# Patient Record
Sex: Male | Born: 1965 | Race: Black or African American | Hispanic: No | Marital: Married | Smoking: Current every day smoker
Health system: Southern US, Community
[De-identification: ages and names within clinical notes are randomized; demographics above are authoritative.]

## PROBLEM LIST (undated history)

## (undated) DIAGNOSIS — F1410 Cocaine abuse, uncomplicated: Secondary | ICD-10-CM

## (undated) DIAGNOSIS — M6282 Rhabdomyolysis: Secondary | ICD-10-CM

## (undated) DIAGNOSIS — I10 Essential (primary) hypertension: Secondary | ICD-10-CM

## (undated) DIAGNOSIS — R569 Unspecified convulsions: Secondary | ICD-10-CM

## (undated) DIAGNOSIS — Q2730 Arteriovenous malformation, site unspecified: Secondary | ICD-10-CM

## (undated) DIAGNOSIS — F1910 Other psychoactive substance abuse, uncomplicated: Secondary | ICD-10-CM

## (undated) DIAGNOSIS — F10239 Alcohol dependence with withdrawal, unspecified: Secondary | ICD-10-CM

## (undated) DIAGNOSIS — F10939 Alcohol use, unspecified with withdrawal, unspecified: Secondary | ICD-10-CM

## (undated) DIAGNOSIS — G40909 Epilepsy, unspecified, not intractable, without status epilepticus: Secondary | ICD-10-CM

## (undated) HISTORY — DX: Arteriovenous malformation, site unspecified: Q27.30

## (undated) HISTORY — DX: Essential (primary) hypertension: I10

## (undated) HISTORY — PX: OTHER SURGICAL HISTORY: SHX169

## (undated) HISTORY — DX: Epilepsy, unspecified, not intractable, without status epilepticus: G40.909

---

## 2001-12-22 ENCOUNTER — Emergency Department (HOSPITAL_COMMUNITY): Admission: EM | Admit: 2001-12-22 | Discharge: 2001-12-22 | Payer: Self-pay | Admitting: Emergency Medicine

## 2013-02-09 ENCOUNTER — Other Ambulatory Visit: Payer: Self-pay | Admitting: Neurology

## 2013-02-09 ENCOUNTER — Encounter: Payer: Self-pay | Admitting: Neurology

## 2013-02-09 ENCOUNTER — Ambulatory Visit (INDEPENDENT_AMBULATORY_CARE_PROVIDER_SITE_OTHER): Payer: Managed Care, Other (non HMO) | Admitting: Neurology

## 2013-02-09 ENCOUNTER — Ambulatory Visit (INDEPENDENT_AMBULATORY_CARE_PROVIDER_SITE_OTHER): Payer: Managed Care, Other (non HMO)

## 2013-02-09 VITALS — BP 125/67 | HR 76 | Ht 70.0 in | Wt 191.8 lb

## 2013-02-09 DIAGNOSIS — R569 Unspecified convulsions: Secondary | ICD-10-CM

## 2013-02-09 DIAGNOSIS — Q282 Arteriovenous malformation of cerebral vessels: Secondary | ICD-10-CM | POA: Insufficient documentation

## 2013-02-09 DIAGNOSIS — Q283 Other malformations of cerebral vessels: Secondary | ICD-10-CM

## 2013-02-09 MED ORDER — LEVETIRACETAM ER 500 MG PO TB24: 1000.0000 mg | ORAL_TABLET | Freq: Every day | ORAL | Status: DC

## 2013-02-09 NOTE — Procedures (Signed)
  History:  Samuel Atkinson is a 47 year old gentleman with a several week history of episodes of confusion, slurred speech, eye blinking, and difficulty talking. The patient is being evaluated for possible seizure events.  This is a routine EEG. No skull defects are noted. Medications include Norvasc, folic acid, Keppra, and Prilosec.  EEG classification: Normal awake  Description of the recording: The background rhythms of this recording consists of a fairly well modulated medium amplitude alpha rhythm of 10 Hz that is reactive to eye opening and closure. As the record progresses, the patient appears to remain in the waking state throughout the recording. Photic stimulation was performed, resulting in a bilateral and symmetric photic driving response. Hyperventilation was also performed, resulting in a minimal buildup of the background rhythm activities without significant slowing seen. At no time during the recording does there appear to be evidence of spike or spike wave discharges or evidence of focal slowing. EKG monitor shows no evidence of cardiac rhythm abnormalities with a heart rate of 66.  Impression: This is a normal EEG recording in the waking state. No evidence of ictal or interictal discharges are seen.

## 2013-02-09 NOTE — Patient Instructions (Addendum)
Overall you are doing fairly well but I do want to suggest a few things today:   As far as your medications are concerned, I would like to suggest increasing your Keppra XR to 1000mg  daily. Please take 2 tablets of the 500mg  strength.  As far as diagnostic testing: I would like to get an EEG study done today. Please notify the front desk that this will be scheduled for today.  No driving for 6 months minimum, please follow up with the DMV.   Please follow up with neurosurgery for further evaluation of the AVM and possible imaging.  I would like to see you back in 4 months, sooner if we need to. Please call us with any interim questions, concerns, problems, updates or refill requests.   Please also call us for any test results so we can go over those with you on the phone.  My clinical assistant and will answer any of your questions and relay your messages to me and also relay most of my messages to you.   Our phone number is [phone]. We also have an after hours call service for urgent matters and there is a physician on-call for urgent questions. For any emergencies you know to call 911 or go to the nearest emergency room

## 2013-02-09 NOTE — Progress Notes (Signed)
[facility]  Provider:  Dr Hosie Atkinson Referring Provider: [name], MD Primary Care Physician:  Samuel Copas, MD  CC: seizure disorder  HPI:  Samuel Atkinson is a 47 y.o. male here as a referral from Samuel. Abigail Atkinson for establishing care in patient with history of seizure disorder and AVM s/p embolization.   The past few weeks the patient's wife has noticed frequent episodes of confusion, slurred speech, multiple eye blinking, difficulty talking. During these times he appears to be started blankly and if he responds it does not make sense. Episodes last 20-30 seconds, he is having 3-4 per day. The patient reports being unaware of the symptoms, though he reports feeling out of it the last few days. He also notes brief episodes of cramping of his hands, and tingling paresthesias in his extremities. He does not attribute any of these events 2 seizures. The patient is wet reports his last seizure last year. He continues to take Keppra 500 mg daily, reports occasionally missing a dose though good compliance last few weeks. Denies any recent illnesses, no fevers, no neck stiffness. He does have her history of drug or alcohol abuse, though denies any recent usage. No change in sleep pattern, no new medications added recently.  He is a complicated medical history, involving drug and alcohol abuse, and multiple seizures since his 35s. These were initially attributed to his polysubstance abuse, the last year he was found to have a AVM and has undergone embolization x2. His most recent angiogram was in January 2014 and per the patient everything was normal.  Per PCP, Samuel Samuel Atkinson, note from 12/24/2012:Seizure disorder started with AVM discovered in August 2013, "on life support" from August 28 thru Sept 5 s/p 2 embolizations of AVM with angiogram Jan 2014. Hx of drug/EtOH abuse and suicide attempts.  Review of Systems: Out of a complete 14 system review, the patient complains of only the  following symptoms, and all other reviewed systems are negative. Positive for fatigue blurred vision feeling hot cramps ringing in ears slurred speech dizziness tremor headache depression decreased energy change in appetite disinterest in activity  History   Social History  . Marital Status: Single    Spouse Name: N/A    Number of Children: 3  . Years of Education: GED    Occupational History  . Unemployed    Social History Main Topics  . Smoking status: Current Every Day Smoker -- 1.00 packs/day  . Smokeless tobacco: Never Used  . Alcohol Use: No  . Drug Use: No     Comment: patient claims to be clean now.   . Sexual Activity: Not on file   Other Topics Concern  . Not on file   Social History Narrative   Consumes a lots of caffeine daily,resides with wife.    Family History  Problem Relation Age of Onset  . Hypertension Father   . Coronary artery disease Father   . Diabetes Mother   . Hypertension Mother   . Coronary artery disease Brother   . Healthy Brother   . Coronary artery disease Brother     Past Medical History  Diagnosis Date  . Hypertension   . Seizure disorder   . Hx of substance abuse     Past Surgical History  Procedure Laterality Date  . Avm embolization  2013    Current Outpatient Prescriptions  Medication Sig Dispense Refill  . amLODipine (NORVASC) 10 MG tablet Take 10 mg by mouth daily.      Marland Kitchen  folic acid (FOLVITE) 1 MG tablet Take 1 mg by mouth daily.      Marland Kitchen levETIRAcetam (KEPPRA XR) 500 MG 24 hr tablet Take 500 mg by mouth every 12 (twelve) hours.      Marland Kitchen omeprazole (PRILOSEC) 40 MG capsule Take 40 mg by mouth daily.       No current facility-administered medications for this visit.    Allergies as of 02/09/2013  . (Not on File)    Vitals: BP 125/67  Pulse 76  Ht 5\' 10"  (1.778 m)  Wt 191 lb 12 oz (86.977 kg)  BMI 27.51 kg/m2 Last Weight:  Wt Readings from Last 1 Encounters:  02/09/13 191 lb 12 oz (86.977 kg)   Last Height:    Ht Readings from Last 1 Encounters:  02/09/13 5\' 10"  (1.778 m)     Physical exam: Exam: Gen: NAD, conversant Eyes: anicteric sclerae, moist conjunctivae HENT: Atraumati Neck: Trachea midline; supple,  Lungs: CTA, no wheezing, rales, rhonic                          CV: RRR, no MRG Abdomen: Soft, non-tender;  Extremities: No peripheral edema  Skin: Normal temperature, no rash,  Psych: Appropriate affect, pleasant  Neuro: MS: AA&Ox3, appropriately interactive, normal affect   Attention: WORLD backwards  Speech: fluent w/o paraphasic error  Memory: good recent and remote recall  CN: PERRL, impaired left lateral gaze, no nystagmus, no ptosis, sensation intact to LT V1-V3 bilat, face symmetric, no weakness, hearing grossly intact, palate elevates symmetrically, shoulder shrug 5/5 bilat,  tongue protrudes midline, no fasiculations noted.  Motor: normal bulk and tone Strength: 5/5  In all extremities  Coord: slowed movements with RAM and FTN but no tremor noted  Reflexes: symmetrical, bilat downgoing toes,  Sens: LT intact in all extremities  Gait: posture, stance, stride and arm-swing normal.   Assessment:  After physical and neurologic examination, review of laboratory studies, imaging, neurophysiology testing and pre-existing records, assessment will be reviewed on the problem list.  Plan:  Treatment plan and additional workup will be reviewed under Problem List.  [name] is a pleasant 47 year old African gentleman who presents for initial evaluation and establishment of care for his seizure disorder. He is a complicated medical history including polysubstance abuse and AVM status post embolization x2. His seizures were relatively controlled until the past few weeks. His wife has noted frequent episodes, 3-4 times per day, of staring blankly with frequent eye blinking, verbal output that is not appropriate to conversation. His episodes last 2030 seconds and the  patient quickly returned to baseline. The patient is unaware these events, though reports feeling out of it lately. He has been taking Keppra 500 mg daily of the extended release form. He reports decent compliance though has missed a few doses. These events are concerning for breakthrough seizures, most likely complex partial. As his physical exam is stable there is less concern that there may be a leak from the AVM. The patient is going to followup with neurosurgery in the next week or so, will defer imaging to their recommendations at this time.  1) seizure disorder 2) AVM status post embolization  -Increase Keppra XRT 1 g daily, can consider titrating up in the future. -Will order routine EEG -Will defer on imaging at this time, pending evaluation by neurosurgery -Patient scheduled to followup with neurosurgery in the next 2 weeks. -Patient counseled on avoiding driving for 6 months minimum -Patient counseled  on avoiding alcohol and drug use as this can increase seizure frequency -Would hold off on starting Chantix at this time due to risks of increased seizure frequency. -Followup in 4 months or earlier as needed

## 2013-02-10 ENCOUNTER — Telehealth: Payer: Self-pay

## 2013-02-10 NOTE — Telephone Encounter (Signed)
Called patient and left message normal EEG results.

## 2013-02-14 ENCOUNTER — Telehealth: Payer: Self-pay | Admitting: Neurology

## 2013-02-15 ENCOUNTER — Other Ambulatory Visit: Payer: Self-pay | Admitting: Neurology

## 2013-02-15 DIAGNOSIS — R569 Unspecified convulsions: Secondary | ICD-10-CM

## 2013-02-16 NOTE — Telephone Encounter (Signed)
I spoke with Casimiro Needle from [employer].  He wanted to let us know the patients order has shipped, and nothing further is needed from [place] at this time.

## 2013-06-13 ENCOUNTER — Ambulatory Visit: Payer: Managed Care, Other (non HMO) | Admitting: Neurology

## 2013-07-07 ENCOUNTER — Ambulatory Visit (INDEPENDENT_AMBULATORY_CARE_PROVIDER_SITE_OTHER): Payer: Managed Care, Other (non HMO) | Admitting: Neurology

## 2013-07-07 ENCOUNTER — Encounter: Payer: Self-pay | Admitting: Neurology

## 2013-07-07 ENCOUNTER — Encounter (INDEPENDENT_AMBULATORY_CARE_PROVIDER_SITE_OTHER): Payer: Self-pay

## 2013-07-07 VITALS — BP 113/66 | HR 73 | Ht 72.5 in | Wt 197.0 lb

## 2013-07-07 DIAGNOSIS — R569 Unspecified convulsions: Secondary | ICD-10-CM

## 2013-07-07 NOTE — Progress Notes (Signed)
[facility]  Provider:  Dr Hosie Poisson Referring Provider: [name], MD Primary Care Physician:  [name], MD  CC: seizure disorder  HPI:  Samuel Atkinson is a 48 y.o. male here as a follow up from [name] for seizure management in patient with history of  AVM s/p embolization. Last visit was 01/2013 at which time his Keppra XRT was increased to 1000mg  daily, he was instructed to get a routine EEG and was scheduled to follow up with neurosurgery. His EEG was read as normal. Had angiogram done with neurosurgery in December and everything was stable. Tolerating Keppra well, takes it once a day. He is not currently driving, has been seizure free for 5 months. Wishes to start driving again.    Initial visit 01/2013: The past few weeks the patient's wife has noticed frequent episodes of confusion, slurred speech, multiple eye blinking, difficulty talking. During these times he appears to be started blankly and if he responds it does not make sense. Episodes last 20-30 seconds, he is having 3-4 per day. The patient reports being unaware of the symptoms, though he reports feeling out of it the last few days. He also notes brief episodes of cramping of his hands, and tingling paresthesias in his extremities. He does not attribute any of these events 2 seizures. The patient is wet reports his last seizure last year. He continues to take Keppra 500 mg daily, reports occasionally missing a dose though good compliance last few weeks. Denies any recent illnesses, no fevers, no neck stiffness. He does have her history of drug or alcohol abuse, though denies any recent usage. No change in sleep pattern, no new medications added recently. He is a complicated medical history, involving drug and alcohol abuse, and multiple seizures since his 50s. These were initially attributed to his polysubstance abuse, the last year he was found to have a AVM and has undergone embolization x2. His  most recent angiogram was in January 2014 and per the patient everything was normal.   Review of Systems: Out of a complete 14 system review, the patient complains of only the following symptoms, and all other reviewed systems are negative. + headache  History   Social History  . Marital Status: Single    Spouse Name: N/A    Number of Children: 3  . Years of Education: GED    Occupational History  . Unemployed    Social History Main Topics  . Smoking status: Current Every Day Smoker -- 1.00 packs/day  . Smokeless tobacco: Never Used  . Alcohol Use: No  . Drug Use: No     Comment: patient claims to be clean now.   . Sexual Activity: Not on file   Other Topics Concern  . Not on file   Social History Narrative   Consumes a lots of caffeine daily,resides with wife.    Family History  Problem Relation Age of Onset  . Hypertension Father   . Coronary artery disease Father   . Diabetes Mother   . Hypertension Mother   . Coronary artery disease Brother   . Healthy Brother   . Coronary artery disease Brother     Past Medical History  Diagnosis Date  . Hypertension   . Seizure disorder   . Hx of substance abuse   . AVM (arteriovenous malformation)     Past Surgical History  Procedure Laterality Date  . Avm embolization  2013    Current Outpatient Prescriptions  Medication Sig Dispense Refill  .  HYDROcodone-acetaminophen (NORCO/VICODIN) 5-325 MG per tablet as needed.       . Vitamin D, Ergocalciferol, (DRISDOL) 50000 UNITS CAPS capsule       . amLODipine (NORVASC) 10 MG tablet Take 10 mg by mouth daily.      . folic acid (FOLVITE) 1 MG tablet Take 1 mg by mouth daily.      Marland Kitchen. levETIRAcetam (KEPPRA XR) 500 MG 24 hr tablet Take 2 tablets (1,000 mg total) by mouth daily.  60 tablet  3  . omeprazole (PRILOSEC) 40 MG capsule Take 40 mg by mouth daily.       No current facility-administered medications for this visit.    Allergies as of 07/07/2013  . (No Known  Allergies)    Vitals: BP 113/66  Pulse 73  Ht 6' 0.5" (1.842 m)  Wt 197 lb (89.359 kg)  BMI 26.34 kg/m2 Last Weight:  Wt Readings from Last 1 Encounters:  07/07/13 197 lb (89.359 kg)   Last Height:   Ht Readings from Last 1 Encounters:  07/07/13 6' 0.5" (1.842 m)     Physical exam: Exam: Gen: NAD, conversant Eyes: anicteric sclerae, moist conjunctivae HENT: Atraumati Neck: Trachea midline; supple,  Lungs: CTA, no wheezing, rales, rhonic                          CV: RRR, no MRG Abdomen: Soft, non-tender;  Extremities: No peripheral edema  Skin: Normal temperature, no rash,  Psych: Appropriate affect, pleasant  Neuro: MS: AA&Ox3, appropriately interactive, normal affect   Speech: fluent w/o paraphasic error  Memory: good recent and remote recall  CN: PERRL, impaired left lateral gaze, no nystagmus, no ptosis, sensation intact to LT V1-V3 bilat, face symmetric, no weakness, hearing grossly intact, palate elevates symmetrically, shoulder shrug 5/5 bilat,  tongue protrudes midline, no fasiculations noted.  Motor: normal bulk and tone Strength: 5/5  In all extremities  Coord: slowed movements with RAM and FTN but no tremor noted  Reflexes: symmetrical, bilat downgoing toes,  Sens: LT intact in all extremities  Gait: posture, stance, stride and arm-swing normal.   Assessment:  After physical and neurologic examination, review of laboratory studies, imaging, neurophysiology testing and pre-existing records, assessment will be reviewed on the problem list.  Plan:  Treatment plan and additional workup will be reviewed under Problem List.  Samuel Atkinson is a pleasant 48 year old African gentleman who presents for follow up evaluation of his seizure disorder. He is a complicated medical history including polysubstance abuse and AVM status post embolization x2. At last visit his seizures were poorly controlled and his Keppra XR was increased to 1gm daily. He had a routine  EEG which was unremarkable. Overall doing well since last visit with no seizure in 5 months. Patient wishes to start driving again. Counseled him that he needs to be seizure free for 6 months. He will call the office at the end of this month and we will discuss driving at that time. He will remain on Keppra XR 1000mg  daily for now.   1) seizure disorder 2) AVM status post embolization  -continue Keppra XRT 1 g daily, can consider titrating up in the future. -Patient counseled on avoiding driving for 6 months minimum. Will call office end of this month to discuss further -Patient counseled on avoiding alcohol and drug use as this can increase seizure frequency -Follow up 6 months

## 2013-12-28 ENCOUNTER — Telehealth: Payer: Self-pay | Admitting: *Deleted

## 2013-12-28 NOTE — Telephone Encounter (Signed)
Called patient and left a message that his appt scheduled for 01-09-14 has been canceled due to Dr. Hosie PoissonSumner being on vacation. Patient is to call the office back to r/s.

## 2014-01-03 ENCOUNTER — Encounter: Payer: Self-pay | Admitting: Neurology

## 2014-01-03 ENCOUNTER — Telehealth: Payer: Self-pay | Admitting: Neurology

## 2014-01-03 NOTE — Telephone Encounter (Signed)
Spoke to patient's wife regarding rescheduling 01/09/14 appointment per Dr. Minus BreedingSumner's schedule, wife verbalized understanding. Printed and mailed letter with new appointment time.

## 2014-01-09 ENCOUNTER — Ambulatory Visit: Payer: Managed Care, Other (non HMO) | Admitting: Neurology

## 2014-02-06 ENCOUNTER — Telehealth: Payer: Self-pay | Admitting: *Deleted

## 2014-02-06 NOTE — Telephone Encounter (Signed)
Spoke to patient wife to r/s patient appt per Dr. Hosie PoissonSumner. Patient wife states that the patient needs an appt 1st thing in the morning because he woks 3rd shift. Patient wife will call back to r/s appt.

## 2014-02-07 ENCOUNTER — Ambulatory Visit: Payer: Managed Care, Other (non HMO) | Admitting: Neurology

## 2014-02-23 ENCOUNTER — Encounter: Payer: Self-pay | Admitting: Neurology

## 2014-02-23 ENCOUNTER — Ambulatory Visit (INDEPENDENT_AMBULATORY_CARE_PROVIDER_SITE_OTHER): Payer: Managed Care, Other (non HMO) | Admitting: Neurology

## 2014-02-23 VITALS — BP 113/58 | HR 62 | Ht 72.0 in | Wt 180.0 lb

## 2014-02-23 DIAGNOSIS — R569 Unspecified convulsions: Secondary | ICD-10-CM

## 2014-02-23 DIAGNOSIS — Q283 Other malformations of cerebral vessels: Secondary | ICD-10-CM

## 2014-02-23 DIAGNOSIS — Q282 Arteriovenous malformation of cerebral vessels: Secondary | ICD-10-CM

## 2014-02-23 MED ORDER — ESCITALOPRAM OXALATE 10 MG PO TABS: 10.0000 mg | ORAL_TABLET | Freq: Every day | ORAL | Status: DC

## 2014-02-23 NOTE — Progress Notes (Signed)
[facility]  Provider:  Dr Hosie Poisson Referring Provider: [name], MD Primary Care Physician:  [name], MD  CC: seizure disorder  HPI:  Samuel Atkinson is a 48 y.o. male here as a follow up from [name] for seizure management in patient with history of  AVM s/p embolization. Last visit was 06/2013 at which time he was doing well overall and no medication adjustments were made.  Notes last week not feeling well, unable to describe it. Went to work and noted generalized shaking of all 4 extremities, felt cold, was diaphoretic, was able to communicate with people. No LOC, no lack of memory of this time period. Shaking lasted around 5 minutes, felt out of it for the whole day. Wife notes he had been drinking a [employer] energy drink and a [employer] this day. Since this episode he has been having cramping in his legs and aching in his legs. He denies any DM, no thyroid abnormalities.   Continues to take Keppra XR  once daily. Tolerating it well. Wife notes that his anxiety is "out of control". He gets very nervous and anxious about everything. She notes this his memory is getting progressively worse. Trouble with short term memory.   Initial visit 01/2013: The past few weeks the patient's wife has noticed frequent episodes of confusion, slurred speech, multiple eye blinking, difficulty talking. During these times he appears to be started blankly and if he responds it does not make sense. Episodes last 20-30 seconds, he is having 3-4 per day. The patient reports being unaware of the symptoms, though he reports feeling out of it the last few days. He also notes brief episodes of cramping of his hands, and tingling paresthesias in his extremities. He does not attribute any of these events 2 seizures. The patient is wet reports his last seizure last year. He continues to take Keppra 500 mg daily, reports occasionally missing a dose though good compliance last few weeks.  Denies any recent illnesses, no fevers, no neck stiffness. He does have her history of drug or alcohol abuse, though denies any recent usage. No change in sleep pattern, no new medications added recently. He is a complicated medical history, involving drug and alcohol abuse, and multiple seizures since his 35s. These were initially attributed to his polysubstance abuse, the last year he was found to have a AVM and has undergone embolization x2. His most recent angiogram was in January 2014 and per the patient everything was normal.  Review of Systems: Out of a complete 14 system review, the patient complains of only the following symptoms, and all other reviewed systems are negative. + headache  History   Social History  . Marital Status: Single    Spouse Name: N/A    Number of Children: 3  . Years of Education: GED    Occupational History  . Unemployed    Social History Main Topics  . Smoking status: Current Every Day Smoker -- 1.00 packs/day  . Smokeless tobacco: Never Used  . Alcohol Use: No  . Drug Use: No     Comment: patient claims to be clean now.   . Sexual Activity: Not on file   Other Topics Concern  . Not on file   Social History Narrative   Consumes a lots of caffeine daily,resides with wife.    Family History  Problem Relation Age of Onset  . Hypertension Father   . Coronary artery disease Father   . Diabetes Mother   .  Hypertension Mother   . Coronary artery disease Brother   . Healthy Brother   . Coronary artery disease Brother     Past Medical History  Diagnosis Date  . Hypertension   . Seizure disorder   . Hx of substance abuse   . AVM (arteriovenous malformation)     Past Surgical History  Procedure Laterality Date  . Avm embolization  2013    Current Outpatient Prescriptions  Medication Sig Dispense Refill  . amLODipine (NORVASC) 10 MG tablet Take 10 mg by mouth daily.      . folic acid (FOLVITE) 1 MG tablet Take 1 mg by mouth daily.       Marland Kitchen HYDROcodone-acetaminophen (NORCO/VICODIN) 5-325 MG per tablet as needed.       . levETIRAcetam (KEPPRA XR) 500 MG 24 hr tablet Take 2 tablets (1,000 mg total) by mouth daily.  60 tablet  3  . omeprazole (PRILOSEC) 40 MG capsule Take 40 mg by mouth daily.      . Vitamin D, Ergocalciferol, (DRISDOL) 50000 UNITS CAPS capsule        No current facility-administered medications for this visit.    Allergies as of 02/23/2014  . (No Known Allergies)    Vitals: There were no vitals taken for this visit. Last Weight:  Wt Readings from Last 1 Encounters:  07/07/13 197 lb (89.359 kg)   Last Height:   Ht Readings from Last 1 Encounters:  07/07/13 6' 0.5" (1.842 m)     Physical exam: Exam: Gen: NAD, conversant Eyes: anicteric sclerae, moist conjunctivae HENT: Atraumati Neck: Trachea midline; supple,  Lungs: CTA, no wheezing, rales, rhonic                          CV: RRR, no MRG Abdomen: Soft, non-tender;  Extremities: No peripheral edema  Skin: Normal temperature, no rash,  Psych: Appropriate affect, pleasant  Neuro: MS: AA&Ox3, appropriately interactive, normal affect   Speech: fluent w/o paraphasic error  Memory: good recent and remote recall  CN: PERRL, impaired left lateral gaze, no nystagmus, no ptosis, sensation intact to LT V1-V3 bilat, face symmetric, no weakness, hearing grossly intact, palate elevates symmetrically, shoulder shrug 5/5 bilat,  tongue protrudes midline, no fasiculations noted.  Motor: normal bulk and tone Strength: 5/5  In all extremities  Coord: slowed movements with RAM and FTN but no tremor noted  Reflexes: symmetrical, bilat downgoing toes,  Sens: LT intact in all extremities  Gait: posture, stance, stride and arm-swing normal.   Assessment:  After physical and neurologic examination, review of laboratory studies, imaging, neurophysiology testing and pre-existing records, assessment will be reviewed on the problem list.  Plan:   Treatment plan and additional workup will be reviewed under Problem List.  1) seizure disorder 2) AVM status post embolization  Mr. Dupin is a pleasant 48 year old African gentleman who presents for follow up evaluation of his seizure disorder. He has a complicated medical history including polysubstance abuse and AVM status post embolization x2. Returns today for follow up visit. Since last visit, he notes one episode, which per description does not sound fully consistent with an epileptic event. Suspect it may be related to use of multiple energy drinks. Will check TSH and HbA1c for alternative etiology of episode (patient to fax recent blood work from PCP). Patient and wife note excessive anxiety and "moodiness". Discussed different options, will start low dose lexapro. Follow up with Dr Anne Hahn in 6 months.

## 2014-02-23 NOTE — Patient Instructions (Signed)
Overall you are doing fairly well but I do want to suggest a few things today:   Remember to drink plenty of fluid, eat healthy meals and do not skip any meals. Try to eat protein with a every meal and eat a healthy snack such as fruit or nuts in between meals. Try to keep a regular sleep-wake schedule and try to exercise daily, particularly in the form of walking, 20-30 minutes a day, if you can.   As far as your medications are concerned, I would like to suggest the following: 1)Please continue your Keppra at its current dose and schedule 2)I would like you to try taking Lexapro  daily  Please sign a medical release form so we can get your medical records  Please follow up in 6 months with Dr Anne Hahn.  Please call us with any interim questions, concerns, problems, updates or refill requests.   My clinical assistant and will answer any of your questions and relay your messages to me and also relay most of my messages to you.   Our phone number is [phone]. We also have an after hours call service for urgent matters and there is a physician on-call for urgent questions. For any emergencies you know to call 911 or go to the nearest emergency room

## 2014-03-25 ENCOUNTER — Emergency Department (HOSPITAL_COMMUNITY)
Admission: EM | Admit: 2014-03-25 | Discharge: 2014-03-26 | Disposition: A | Discharge status: Other Acute Inpatient Hospital | Payer: 59 | Attending: Emergency Medicine | Admitting: Emergency Medicine

## 2014-03-25 ENCOUNTER — Emergency Department (HOSPITAL_COMMUNITY): Payer: 59

## 2014-03-25 ENCOUNTER — Encounter (HOSPITAL_COMMUNITY): Payer: Self-pay | Admitting: Emergency Medicine

## 2014-03-25 DIAGNOSIS — Z91040 Latex allergy status: Secondary | ICD-10-CM | POA: Insufficient documentation

## 2014-03-25 DIAGNOSIS — F10988 Alcohol use, unspecified with other alcohol-induced disorder: Secondary | ICD-10-CM | POA: Diagnosis not present

## 2014-03-25 DIAGNOSIS — F329 Major depressive disorder, single episode, unspecified: Secondary | ICD-10-CM | POA: Diagnosis not present

## 2014-03-25 DIAGNOSIS — Z008 Encounter for other general examination: Secondary | ICD-10-CM | POA: Insufficient documentation

## 2014-03-25 DIAGNOSIS — F3289 Other specified depressive episodes: Secondary | ICD-10-CM | POA: Diagnosis not present

## 2014-03-25 DIAGNOSIS — F1410 Cocaine abuse, uncomplicated: Secondary | ICD-10-CM | POA: Diagnosis not present

## 2014-03-25 DIAGNOSIS — R4182 Altered mental status, unspecified: Secondary | ICD-10-CM | POA: Diagnosis not present

## 2014-03-25 DIAGNOSIS — F1014 Alcohol abuse with alcohol-induced mood disorder: Secondary | ICD-10-CM | POA: Diagnosis present

## 2014-03-25 DIAGNOSIS — G40909 Epilepsy, unspecified, not intractable, without status epilepticus: Secondary | ICD-10-CM | POA: Diagnosis not present

## 2014-03-25 DIAGNOSIS — I10 Essential (primary) hypertension: Secondary | ICD-10-CM | POA: Diagnosis not present

## 2014-03-25 DIAGNOSIS — Z79899 Other long term (current) drug therapy: Secondary | ICD-10-CM | POA: Insufficient documentation

## 2014-03-25 DIAGNOSIS — F32A Depression, unspecified: Secondary | ICD-10-CM

## 2014-03-25 DIAGNOSIS — F10959 Alcohol use, unspecified with alcohol-induced psychotic disorder, unspecified: Secondary | ICD-10-CM

## 2014-03-25 DIAGNOSIS — F1910 Other psychoactive substance abuse, uncomplicated: Secondary | ICD-10-CM

## 2014-03-25 DIAGNOSIS — Q283 Other malformations of cerebral vessels: Secondary | ICD-10-CM | POA: Diagnosis not present

## 2014-03-25 LAB — CBC WITH DIFFERENTIAL/PLATELET
BASOS ABS: 0 10*3/uL (ref 0.0–0.1)
BASOS PCT: 0 % (ref 0–1)
EOS PCT: 1 % (ref 0–5)
Eosinophils Absolute: 0.1 10*3/uL (ref 0.0–0.7)
HEMATOCRIT: 50.3 % (ref 39.0–52.0)
Hemoglobin: 17.8 g/dL — ABNORMAL HIGH (ref 13.0–17.0)
Lymphocytes Relative: 32 % (ref 12–46)
Lymphs Abs: 3.1 10*3/uL (ref 0.7–4.0)
MCH: 33.6 pg (ref 26.0–34.0)
MCHC: 35.4 g/dL (ref 30.0–36.0)
MCV: 95.1 fL (ref 78.0–100.0)
MONO ABS: 1.1 10*3/uL — AB (ref 0.1–1.0)
Monocytes Relative: 12 % (ref 3–12)
Neutro Abs: 5.4 10*3/uL (ref 1.7–7.7)
Neutrophils Relative %: 55 % (ref 43–77)
Platelets: 204 10*3/uL (ref 150–400)
RBC: 5.29 MIL/uL (ref 4.22–5.81)
RDW: 12.7 % (ref 11.5–15.5)
WBC: 9.7 10*3/uL (ref 4.0–10.5)

## 2014-03-25 LAB — COMPREHENSIVE METABOLIC PANEL
ALT: 30 U/L (ref 0–53)
AST: 46 U/L — AB (ref 0–37)
Albumin: 4.1 g/dL (ref 3.5–5.2)
Alkaline Phosphatase: 79 U/L (ref 39–117)
Anion gap: 22 — ABNORMAL HIGH (ref 5–15)
BUN: 16 mg/dL (ref 6–23)
CALCIUM: 9.5 mg/dL (ref 8.4–10.5)
CO2: 20 mEq/L (ref 19–32)
CREATININE: 1.18 mg/dL (ref 0.50–1.35)
Chloride: 96 mEq/L (ref 96–112)
GFR calc Af Amer: 83 mL/min — ABNORMAL LOW (ref >90)
GFR, EST NON AFRICAN AMERICAN: 71 mL/min — AB (ref >90)
Glucose, Bld: 109 mg/dL — ABNORMAL HIGH (ref 70–99)
Potassium: 4.6 mEq/L (ref 3.7–5.3)
Sodium: 138 mEq/L (ref 137–147)
Total Bilirubin: 1 mg/dL (ref 0.3–1.2)
Total Protein: 8.3 g/dL (ref 6.0–8.3)

## 2014-03-25 LAB — RAPID URINE DRUG SCREEN, HOSP PERFORMED
Amphetamines: NOT DETECTED
Barbiturates: NOT DETECTED
Benzodiazepines: NOT DETECTED
COCAINE: POSITIVE — AB
OPIATES: NOT DETECTED
Tetrahydrocannabinol: NOT DETECTED

## 2014-03-25 LAB — CBG MONITORING, ED
GLUCOSE-CAPILLARY: 100 mg/dL — AB (ref 70–99)
Glucose-Capillary: 59 mg/dL — ABNORMAL LOW (ref 70–99)

## 2014-03-25 LAB — ETHANOL: Alcohol, Ethyl (B): 173 mg/dL — ABNORMAL HIGH (ref 0–11)

## 2014-03-25 MED ORDER — IBUPROFEN 200 MG PO TABS
600.0000 mg | ORAL_TABLET | Freq: Four times a day (QID) | ORAL | Status: DC | PRN
Start: 2014-03-25 — End: 2014-03-26
  Administered 2014-03-25: 600 mg via ORAL
  Filled 2014-03-25: qty 3

## 2014-03-25 NOTE — ED Notes (Addendum)
Coco Cola provided for CBG of 59. DM Littie Deeds made aware of CBG and intervention.

## 2014-03-25 NOTE — ED Notes (Signed)
Left ankle and left wrist restraints removed. Pt verbalizes the understanding of criteria to remain out of restraints. Sandwich provided. Sitter at the bedside.

## 2014-03-25 NOTE — ED Notes (Signed)
Pt becoming more calm. He is aware of criteria to be released from restraints. He is having periods of confusion and being frightened.

## 2014-03-25 NOTE — ED Provider Notes (Signed)
CSN: [id number]     Arrival date & time 03/25/14  1205 History   First MD Initiated Contact with Patient 03/25/14 1242     Chief Complaint  Patient presents with  . Suicidal  . Homicidal     (Consider location/radiation/quality/duration/timing/severity/associated sxs/prior Treatment) HPI 48 y.o. male with a history of AV malformation status post embolization, as well as significant alcohol and cocaine abuse presenting today after he told his wife that he wanted detoxification from alcohol and cocaine. On arrival the patient was no longer seen this am was verbally aggressive and physically aggressive towards staff. Per the patient's wife the patient has been taking alcohol and using cocaine in the past 2 days, returned home today when he ran out of money.  She has a ho similar behavior on numerous occasions, and his wife denies that he is any more confused than normal, and she found him drinking ETOH this am.  She also states he has a ho noncompliance with medical therapy while in the hospital and she is the only person who can convince him reliably to take part in his care.  No associated medical complaints.   Past Medical History  Diagnosis Date  . Hypertension   . Seizure disorder   . Hx of substance abuse   . AVM (arteriovenous malformation)    Past Surgical History  Procedure Laterality Date  . Avm embolization  2013   Family History  Problem Relation Age of Onset  . Hypertension Father   . Coronary artery disease Father   . Diabetes Mother   . Hypertension Mother   . Coronary artery disease Brother   . Healthy Brother   . Coronary artery disease Brother    History  Substance Use Topics  . Smoking status: Current Every Day Smoker -- 1.00 packs/day  . Smokeless tobacco: Never Used  . Alcohol Use: Yes     Comment: Unsure of amount     Review of Systems  Unable to perform ROS: Mental status change      Allergies  Latex  Home Medications   Prior to Admission  medications   Medication Sig Start Date End Date Taking? Authorizing Provider  albuterol (PROVENTIL HFA;VENTOLIN HFA) 108 (90 BASE) MCG/ACT inhaler Inhale 1-2 puffs into the lungs every 6 (six) hours as needed for wheezing or shortness of breath.   Yes Historical Provider, MD  amLODipine (NORVASC) 10 MG tablet Take 10 mg by mouth daily.   Yes Historical Provider, MD  B Complex-Biotin-FA (SUPER B-50 COMPLEX) CAPS Take 1 capsule by mouth daily.   Yes Historical Provider, MD  CALCIUM MAGNESIUM 750 PO Take 1 tablet by mouth daily.   Yes Historical Provider, MD  Cholecalciferol (VITAMIN D-3) 5000 UNITS TABS Take 5,000 Units by mouth daily.   Yes Historical Provider, MD  escitalopram (LEXAPRO) 10 MG tablet Take 10 mg by mouth daily.   Yes Historical Provider, MD  folic acid (FOLVITE) 1 MG tablet Take 1 mg by mouth daily.   Yes Historical Provider, MD  levETIRAcetam (KEPPRA XR) 500 MG 24 hr tablet Take 500 mg by mouth daily.   Yes Historical Provider, MD  tiotropium (SPIRIVA) 18 MCG inhalation capsule Place 18 mcg into inhaler and inhale daily as needed (for shortness of breath).   Yes Historical Provider, MD   BP 119/62  Pulse 65  Temp(Src) 98 F (36.7 C) (Oral)  Resp 16  SpO2 98% Physical Exam  Vitals reviewed. Constitutional: He appears well-developed and well-nourished.  HENT:  Head: Normocephalic and atraumatic.  Eyes: Conjunctivae and EOM are normal.  Neck: Normal range of motion. Neck supple.  Cardiovascular: Normal rate, regular rhythm and normal heart sounds.   Pulmonary/Chest: Effort normal and breath sounds normal. No respiratory distress.  Abdominal: He exhibits no distension. There is no tenderness. There is no rebound and no guarding.  Musculoskeletal: Normal range of motion.  Neurological: He is alert.  Drowsy, MAE  Skin: Skin is warm and dry.    ED Course  Procedures (including critical care time) Labs Review Labs Reviewed  CBC WITH DIFFERENTIAL - Abnormal; Notable for  the following:    Hemoglobin 17.8 (*)    Monocytes Absolute 1.1 (*)    All other components within normal limits  COMPREHENSIVE METABOLIC PANEL - Abnormal; Notable for the following:    Glucose, Bld 109 (*)    AST 46 (*)    GFR calc non Af Amer 71 (*)    GFR calc Af Amer 83 (*)    Anion gap 22 (*)    All other components within normal limits  URINE RAPID DRUG SCREEN (HOSP PERFORMED) - Abnormal; Notable for the following:    Cocaine POSITIVE (*)    All other components within normal limits  ETHANOL - Abnormal; Notable for the following:    Alcohol, Ethyl (B) 173 (*)    All other components within normal limits  CBG MONITORING, ED - Abnormal; Notable for the following:    Glucose-Capillary 59 (*)    All other components within normal limits  CBG MONITORING, ED - Abnormal; Notable for the following:    Glucose-Capillary 100 (*)    All other components within normal limits    Imaging Review Ct Head Wo Contrast  03/25/2014   CLINICAL DATA:  Headache.  History of AVM.  EXAM: CT HEAD WITHOUT CONTRAST  TECHNIQUE: Contiguous axial images were obtained from the base of the skull through the vertex without intravenous contrast.  COMPARISON:  None  FINDINGS: There is no intra or extra-axial fluid collection or mass lesion. The basilar cisterns and ventricles have a normal appearance. There is no CT evidence for acute infarction or hemorrhage. There is significant artifact from coil in the right frontal lobe region. Bone windows are otherwise unremarkable.  IMPRESSION: No evidence for acute  abnormality.   Electronically Signed   By: Rosalie Gums M.D.   On: 03/25/2014 16:15     EKG Interpretation None      MDM   Final diagnoses:  Alcoholic psychosis, with unspecified complication    48 y.o. male with pertinent PMH of AVM sp embolization, substance abuse presents with recurrent intoxication.  Patient has a history of bender like episodes where he leaves the house, is gone for a few days,  and does cocaine and ETOH until he runs out of money then returns.  He had a similar episode over the last few days, came back today and stated he wanted to get help, but was actively drinking during this episode.  Per the patient's wife the patient requested detoxification this morning, however shortly after arrival to the hospital the patient became nonverbal and was aggressive towards staff members. Per his wife this is happened in the past and she is the only person who can get him to cooperate.  After approximately one hour the patient called, was cooperative, alert and oriented x3, and was only complaining of a mild headache. His physical exam was otherwise unremarkable he had no focal neurodeficits. CT head obtained  and unremarkable. UDS was positive for cocaine.  EtOH elevated to 170. Telepsych consulted for inpatient detoxification. He denies SI/HI/Hallucinations after calming  1. Alcoholic psychosis, with unspecified complication       Mirian Mo, MD 03/26/14 [phone]

## 2014-03-25 NOTE — ED Notes (Signed)
Pt now asking how he got here. He was reoriented to place and situation. He denies pain.

## 2014-03-25 NOTE — ED Notes (Addendum)
Pt stares at this RN and security as if he is looking through [place]. He has bouts of calmness then spurts of extreme agitation. Patient viewed as a threat to self and others. He does not answer questions when asked. He often awakes and  Appears frightened and tearful. He is oriented to place when this occurs.

## 2014-03-25 NOTE — ED Notes (Signed)
Pt's wife at bedside. Sitter at bedside. Telepsych at bedside and ready for TTS.

## 2014-03-25 NOTE — ED Notes (Signed)
Patient resting in bed with his eyes closed; no s/s of distress noted. Respirations regular and unlabored. Sitter at bedside for safety.

## 2014-03-25 NOTE — Consult Note (Signed)
  TTS assess-pt with addiction to alcohol and cocaine and hx of $400 use past 2 days referred to ED by wife voluntarily seeking treatment for same. NO HI/SI.No risk .  UDS/ETOH  confirms history. Will seek treatment help [facility] full

## 2014-03-25 NOTE — ED Notes (Signed)
Pt released from remaining restraints (right wrist and right ankle). MD Littie Deeds at bedside. Pt calm and cooperative at this time. Sitter at bedside. Will continue to monitor.

## 2014-03-25 NOTE — BH Assessment (Signed)
Assessment completed. Consulted Maryjean Morn, PA-C who recommended inpatient treatment. [facility] at capacity. TTS will contact other facilities for placement. Dr.Kohut has been notified of the recommendation.

## 2014-03-25 NOTE — BH Assessment (Signed)
Clinician attempted to assess patient. Patient spoke in very low tone, presented with no eye contact and appeared extremely drowsy. Clinician had to lean in, in an attempt to hear patient but still had difficulty. Clinician received limited information from patient. Per wife [name], patient had been smoking crack and drinking alcohol since he left the home. Patient had been depressed after ex-wife reopened child support case last week. Patient left the home Thursday and did not return until today. Patient asked wife to take him to hospital. Per wife patient got to hospital and became belligerent cursing at people in the lobby yelling "why are ya'll looking at me? I'll kill all you M F-ers." Patient had to be restrained for a couple of hours. Patient was not in restrains at time of assessment. Patient's wife stated before last weekend patient had not used cocaine since 2013. Patient responded "alot" when asked how much he had used. When prompted patient indicated he was not currently suicidal but clinician had difficulty hearing when he last experienced SI.  Patient will be reassessed when more alert and able to provide more information in regards to drug use and assess for safety.    Nira Retort, MSW, LCSW Triage Specialist [phone]

## 2014-03-25 NOTE — ED Notes (Signed)
2 belonging bags placed in locker number 31

## 2014-03-25 NOTE — ED Notes (Addendum)
TTS at bedside and wife remains at beside through out assessment.

## 2014-03-25 NOTE — ED Notes (Addendum)
Rowe Clack (patient's wife) would like to be contacted when psychiatry speak with patient and makes a decision.Wife would like patient to have inpatient care. Her numbers are  Home : [phone] Cell:     [phone]

## 2014-03-25 NOTE — ED Notes (Signed)
Pt wife states he has been having an increase in impulse behavior since Thursday evening. Wife reported pt had been missing from home since Thursday and appeared back this morning. Wife states he has not been taking his prescribed medication regularly. Wife reports he has been probably been taking cocaine and alcohol. Pt wife states he is HI and SI. Pt is currently unstable and very impulsive. Pt is verbally abusive and continues to act out on staff.

## 2014-03-25 NOTE — BH Assessment (Signed)
Tele Assessment Note   Samuel Atkinson is an 48 y.o. male presenting to [facility] ED after binging on alcohol and crack/cocaine. Pt stated  "I want help".  Pt denies SI, HI and AVH at this time. Pt reported that he has attempted suicide in the past by overdosing on medication. Pt has been hospitalized multiple times in the past. Pt is currently not receiving any mental health treatment at this time. Pt is reporting multiple depressive symptoms and shared that his sleep and appetite are poor. Pt reported that he has lost approximately 20lbs in the past month. Pt denies having access to weapons and did not report any pending criminal charges or upcoming court dates. Pt reported that he has been binging on alcohol and crack/cocaine for the past two days.  Pt is drowsy but oriented to person and place. Pt is calm and cooperative at this time. Pt maintained poor eye contact throughout the assessment. PT speech was soft. Pt thought process is coherent and relevant. Pt judgment and insight is fair. Pt did not report any physical, sexual and emotional abuse. Pt reported that he lives with his wife and shared that his family is his support.  Inpatient treatment has been recommended.   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 Hypertension   . Seizure disorder   . Hx of substance abuse   . AVM (arteriovenous malformation)    Axis IV: other psychosocial or environmental problems Axis V: 21-30 behavior considerably influenced by delusions or hallucinations OR serious impairment in judgment, communication OR inability to function in almost all areas  Past Medical History:  Past Medical History  Diagnosis Date  . Hypertension   . Seizure disorder   . Hx of substance abuse   . AVM (arteriovenous malformation)     Past Surgical History  Procedure Laterality Date  . Avm embolization   2013    Family History:  Family History  Problem Relation Age of Onset  . Hypertension Father   . Coronary artery disease Father   . Diabetes Mother   . Hypertension Mother   . Coronary artery disease Brother   . Healthy Brother   . Coronary artery disease Brother     Social History:  reports that he has been smoking.  He has never used smokeless tobacco. He reports that he drinks alcohol. He reports that he uses illicit drugs (Cocaine).  Additional Social History:  Alcohol / Drug Use History of alcohol / drug use?: Yes Withdrawal Symptoms: Irritability Substance #1 Name of Substance 1: Alcohol  1 - Age of First Use: 18 1 - Amount (size/oz): varies  1 - Frequency: multiple times a week  1 - Duration: ongoing  1 - Last Use / Amount: 03-25-14 BAL-[id number] Substance #2 Name of Substance 2: Crack/Cocaine 2 - Age of First Use:  21 2 - Amount (size/oz): $400 2 - Frequency: multiple times a week 2 - Duration: ongoing  2 - Last Use / Amount: 03-25-14 $400   CIWA: CIWA-Ar BP: 112/56 mmHg Pulse Rate: 92 COWS:    PATIENT STRENGTHS: (choose at least two) Average or above average intelligence Supportive family/friends  Allergies:  Allergies  Allergen Reactions  . Latex Rash    Home Medications:  (Not in a hospital admission)  OB/GYN Status:  No LMP for male patient.  General Assessment Data Location of Assessment: [facility] ED Is  this a Tele or Face-to-Face Assessment?: Face-to-Face Is this an Initial Assessment or a Re-assessment for this encounter?: Initial Assessment Living Arrangements: Spouse/significant other Can pt return to current living arrangement?: Yes Admission Status: Voluntary Is patient capable of signing voluntary admission?: Yes Transfer from: Home Referral Source: Self/Family/Friend     [facility] Crisis Care Plan Living Arrangements: Spouse/significant other Name of Psychiatrist: None reported Name of Therapist: None reported  Education Status Is patient  currently in school?: No Current Grade: NA Highest grade of school patient has completed: 12 Name of school: NA Contact person: NA  Risk to self with the past 6 months Suicidal Ideation: No Suicidal Intent: No-Not Currently/Within Last 6 Months Is patient at risk for suicide?: No Suicidal Plan?: No Access to Means: No What has been your use of drugs/alcohol within the last 12 months?: Binge uses alcohol and crack/cocaine Previous Attempts/Gestures: Yes How many times?: 1 Other Self Harm Risks: Substance abuse Triggers for Past Attempts: Unpredictable Intentional Self Injurious Behavior: None Family Suicide History: No Recent stressful life event(s): Other (Comment) Persecutory voices/beliefs?: No Depression: Yes Depression Symptoms: Despondent;Fatigue;Guilt;Loss of interest in usual pleasures;Feeling angry/irritable;Feeling worthless/self pity Substance abuse history and/or treatment for substance abuse?: Yes Suicide prevention information given to non-admitted patients: Not applicable  Risk to Others within the past 6 months Homicidal Ideation: No Thoughts of Harm to Others: No Current Homicidal Intent: No Current Homicidal Plan: No Access to Homicidal Means: No Identified Victim: NA History of harm to others?: No Assessment of Violence: None Noted Violent Behavior Description: Pt is calm and cooperative at this time. Does patient have access to weapons?: No Criminal Charges Pending?: No Does patient have a court date: No  Psychosis Hallucinations: None noted Delusions: None noted  Mental Status Report Appear/Hygiene: In hospital gown Eye Contact: Poor Motor Activity: Freedom of movement Speech: Soft Level of Consciousness: Drowsy Mood: Depressed Affect: Appropriate to circumstance Anxiety Level: None Thought Processes: Coherent;Relevant Judgement: Unimpaired Orientation: Person;Place Obsessive Compulsive Thoughts/Behaviors: None  Cognitive  Functioning Concentration: Normal Memory: Recent Intact;Remote Intact IQ: Average Insight: Poor Impulse Control: Poor Appetite: Poor Weight Loss: 20 Weight Gain: 0 Sleep: Decreased Total Hours of Sleep: 4 Vegetative Symptoms: Staying in bed  ADLScreening [facility] Assessment Services) Patient's cognitive ability adequate to safely complete daily activities?: Yes Patient able to express need for assistance with ADLs?: Yes Independently performs ADLs?: Yes (appropriate for developmental age)  Prior Inpatient Therapy Prior Inpatient Therapy: Yes Prior Therapy Dates: 2000 Prior Therapy Facilty/Provider(s): [facility] Reason for Treatment: Detox  Prior Outpatient Therapy Prior Outpatient Therapy: Yes Prior Therapy Dates: 2000 Prior Therapy Facilty/Provider(s): [facility] Reason for Treatment: [facility]  ADL Screening (condition at time of admission) Patient's cognitive ability adequate to safely complete daily activities?: Yes Is the patient deaf or have difficulty hearing?: No Does the patient have difficulty seeing, even when wearing glasses/contacts?: No Does the patient have difficulty concentrating, remembering, or making decisions?: No Patient able to express need for assistance with ADLs?: Yes Does the patient have difficulty dressing or bathing?: No Independently performs ADLs?: Yes (appropriate for developmental age)       Abuse/Neglect Assessment (Assessment to be complete while patient is alone) Physical Abuse: Denies Verbal Abuse: Denies Sexual Abuse: Denies Exploitation of patient/patient's resources: Denies Self-Neglect: Denies Values / Beliefs Cultural Requests During Hospitalization: None Spiritual Requests During Hospitalization: None   Advance Directives (For Healthcare) Does patient have an advance directive?: No    Additional Information 1:1 In Past 12 Months?: Yes CIRT Risk: No Elopement Risk:  No     Disposition:  Disposition Initial Assessment  Completed for this Encounter: Yes Disposition of Patient: Inpatient treatment program Type of inpatient treatment program: Adult  Leeandra Ellerson S 03/25/2014 8:53 PM

## 2014-03-25 NOTE — ED Notes (Signed)
CBG 100 on ED glucometer

## 2014-03-26 ENCOUNTER — Encounter (HOSPITAL_COMMUNITY): Payer: Self-pay | Admitting: Registered Nurse

## 2014-03-26 ENCOUNTER — Observation Stay (HOSPITAL_COMMUNITY)
Admission: AD | Admit: 2014-03-26 | Discharge: 2014-03-27 | Disposition: A | Discharge status: Home / Self Care | Payer: 59 | Source: Intra-hospital | Attending: Psychiatry | Admitting: Psychiatry

## 2014-03-26 DIAGNOSIS — Z91040 Latex allergy status: Secondary | ICD-10-CM | POA: Insufficient documentation

## 2014-03-26 DIAGNOSIS — F17200 Nicotine dependence, unspecified, uncomplicated: Secondary | ICD-10-CM | POA: Diagnosis not present

## 2014-03-26 DIAGNOSIS — F332 Major depressive disorder, recurrent severe without psychotic features: Secondary | ICD-10-CM | POA: Diagnosis present

## 2014-03-26 DIAGNOSIS — F1910 Other psychoactive substance abuse, uncomplicated: Secondary | ICD-10-CM

## 2014-03-26 DIAGNOSIS — G40909 Epilepsy, unspecified, not intractable, without status epilepticus: Secondary | ICD-10-CM | POA: Diagnosis not present

## 2014-03-26 DIAGNOSIS — F1010 Alcohol abuse, uncomplicated: Secondary | ICD-10-CM | POA: Diagnosis not present

## 2014-03-26 DIAGNOSIS — F1994 Other psychoactive substance use, unspecified with psychoactive substance-induced mood disorder: Secondary | ICD-10-CM | POA: Diagnosis not present

## 2014-03-26 DIAGNOSIS — F1420 Cocaine dependence, uncomplicated: Secondary | ICD-10-CM | POA: Diagnosis not present

## 2014-03-26 DIAGNOSIS — I10 Essential (primary) hypertension: Secondary | ICD-10-CM | POA: Insufficient documentation

## 2014-03-26 DIAGNOSIS — Z79899 Other long term (current) drug therapy: Secondary | ICD-10-CM | POA: Diagnosis not present

## 2014-03-26 DIAGNOSIS — F1014 Alcohol abuse with alcohol-induced mood disorder: Secondary | ICD-10-CM | POA: Diagnosis present

## 2014-03-26 MED ORDER — ESCITALOPRAM OXALATE 10 MG PO TABS
10.0000 mg | ORAL_TABLET | Freq: Every day | ORAL | Status: DC
  Administered 2014-03-27: 10 mg via ORAL
  Filled 2014-03-26 (×3): qty 1

## 2014-03-26 MED ORDER — CHLORDIAZEPOXIDE HCL 25 MG PO CAPS
25.0000 mg | ORAL_CAPSULE | Freq: Once | ORAL | Status: AC
  Administered 2014-03-26: 25 mg via ORAL
  Filled 2014-03-26: qty 1

## 2014-03-26 MED ORDER — ADULT MULTIVITAMIN W/MINERALS CH
1.0000 | ORAL_TABLET | Freq: Every day | ORAL | Status: DC
  Administered 2014-03-27: 1 via ORAL
  Filled 2014-03-26 (×3): qty 1

## 2014-03-26 MED ORDER — HYDROXYZINE HCL 25 MG PO TABS: 25.0000 mg | ORAL_TABLET | Freq: Four times a day (QID) | ORAL | Status: DC | PRN

## 2014-03-26 MED ORDER — CHLORDIAZEPOXIDE HCL 25 MG PO CAPS: 25.0000 mg | ORAL_CAPSULE | Freq: Every day | ORAL | Status: DC

## 2014-03-26 MED ORDER — ESCITALOPRAM OXALATE 10 MG PO TABS
10.0000 mg | ORAL_TABLET | Freq: Every day | ORAL | Status: DC
  Administered 2014-03-26: 10 mg via ORAL
  Filled 2014-03-26: qty 1

## 2014-03-26 MED ORDER — VITAMIN D3 25 MCG (1000 UNIT) PO TABS
5000.0000 [IU] | ORAL_TABLET | Freq: Every day | ORAL | Status: DC
  Administered 2014-03-27: 5000 [IU] via ORAL
  Filled 2014-03-26 (×2): qty 5

## 2014-03-26 MED ORDER — B COMPLEX-C PO TABS
1.0000 | ORAL_TABLET | Freq: Every day | ORAL | Status: DC
  Administered 2014-03-27: 1 via ORAL
  Filled 2014-03-26 (×2): qty 1

## 2014-03-26 MED ORDER — TIOTROPIUM BROMIDE MONOHYDRATE 18 MCG IN CAPS
18.0000 ug | ORAL_CAPSULE | Freq: Every day | RESPIRATORY_TRACT | Status: DC | PRN
  Filled 2014-03-26: qty 5

## 2014-03-26 MED ORDER — FOLIC ACID 1 MG PO TABS
1.0000 mg | ORAL_TABLET | Freq: Every day | ORAL | Status: DC
  Administered 2014-03-27: 1 mg via ORAL
  Filled 2014-03-26 (×2): qty 1

## 2014-03-26 MED ORDER — VITAMIN B-1 100 MG PO TABS
100.0000 mg | ORAL_TABLET | Freq: Every day | ORAL | Status: DC
  Administered 2014-03-27: 100 mg via ORAL
  Filled 2014-03-26 (×3): qty 1

## 2014-03-26 MED ORDER — VITAMIN B-1 100 MG PO TABS: 100.0000 mg | ORAL_TABLET | Freq: Every day | ORAL | Status: DC

## 2014-03-26 MED ORDER — CHLORDIAZEPOXIDE HCL 25 MG PO CAPS: 25.0000 mg | ORAL_CAPSULE | Freq: Three times a day (TID) | ORAL | Status: DC

## 2014-03-26 MED ORDER — ONDANSETRON 4 MG PO TBDP: 4.0000 mg | ORAL_TABLET | Freq: Four times a day (QID) | ORAL | Status: DC | PRN

## 2014-03-26 MED ORDER — ALBUTEROL SULFATE HFA 108 (90 BASE) MCG/ACT IN AERS: 1.0000 | INHALATION_SPRAY | Freq: Four times a day (QID) | RESPIRATORY_TRACT | Status: DC | PRN

## 2014-03-26 MED ORDER — LOPERAMIDE HCL 2 MG PO CAPS: 2.0000 mg | ORAL_CAPSULE | ORAL | Status: DC | PRN

## 2014-03-26 MED ORDER — AMLODIPINE BESYLATE 10 MG PO TABS
10.0000 mg | ORAL_TABLET | Freq: Every day | ORAL | Status: DC
  Administered 2014-03-27: 10 mg via ORAL
  Filled 2014-03-26: qty 1
  Filled 2014-03-26: qty 2
  Filled 2014-03-26: qty 1

## 2014-03-26 MED ORDER — LEVETIRACETAM ER 500 MG PO TB24
500.0000 mg | ORAL_TABLET | Freq: Every day | ORAL | Status: DC
  Administered 2014-03-27: 500 mg via ORAL
  Filled 2014-03-26 (×3): qty 1

## 2014-03-26 MED ORDER — HYDROXYZINE HCL 25 MG PO TABS
25.0000 mg | ORAL_TABLET | Freq: Four times a day (QID) | ORAL | Status: DC | PRN
  Filled 2014-03-26: qty 1

## 2014-03-26 MED ORDER — CHLORDIAZEPOXIDE HCL 25 MG PO CAPS: 25.0000 mg | ORAL_CAPSULE | Freq: Four times a day (QID) | ORAL | Status: DC | PRN

## 2014-03-26 MED ORDER — CHLORDIAZEPOXIDE HCL 25 MG PO CAPS
25.0000 mg | ORAL_CAPSULE | Freq: Four times a day (QID) | ORAL | Status: DC
  Administered 2014-03-26 (×2): 25 mg via ORAL
  Filled 2014-03-26 (×2): qty 1

## 2014-03-26 MED ORDER — ADULT MULTIVITAMIN W/MINERALS CH
1.0000 | ORAL_TABLET | Freq: Every day | ORAL | Status: DC
  Administered 2014-03-26: 1 via ORAL
  Filled 2014-03-26: qty 1

## 2014-03-26 MED ORDER — THIAMINE HCL 100 MG/ML IJ SOLN
100.0000 mg | Freq: Once | INTRAMUSCULAR | Status: AC
  Administered 2014-03-26: 100 mg via INTRAMUSCULAR
  Filled 2014-03-26: qty 2

## 2014-03-26 MED ORDER — ALBUTEROL SULFATE HFA 108 (90 BASE) MCG/ACT IN AERS: 2.0000 | INHALATION_SPRAY | Freq: Four times a day (QID) | RESPIRATORY_TRACT | Status: DC | PRN

## 2014-03-26 MED ORDER — AMLODIPINE BESYLATE 10 MG PO TABS
10.0000 mg | ORAL_TABLET | Freq: Every day | ORAL | Status: DC
  Administered 2014-03-26: 10 mg via ORAL
  Filled 2014-03-26: qty 1

## 2014-03-26 MED ORDER — CHLORDIAZEPOXIDE HCL 25 MG PO CAPS: 25.0000 mg | ORAL_CAPSULE | ORAL | Status: DC

## 2014-03-26 MED ORDER — LEVETIRACETAM ER 500 MG PO TB24
500.0000 mg | ORAL_TABLET | Freq: Every day | ORAL | Status: DC
  Administered 2014-03-26: 500 mg via ORAL
  Filled 2014-03-26: qty 1

## 2014-03-26 MED ORDER — TIOTROPIUM BROMIDE MONOHYDRATE 18 MCG IN CAPS
18.0000 ug | ORAL_CAPSULE | Freq: Every day | RESPIRATORY_TRACT | Status: DC
  Administered 2014-03-27: 18 ug via RESPIRATORY_TRACT
  Filled 2014-03-26: qty 5

## 2014-03-26 NOTE — Progress Notes (Signed)
[facility] contacted the following detox facilities for placement:  [facility]-under review [facility]-declined due to seizure disorder [facility]-decline due to medical [facility]-no beds [facility]-no beds  Blain Pais, [facility]/NS

## 2014-03-26 NOTE — ED Notes (Signed)
Patient belonging removed from locker #31 and given to Nurse tech in psych ED.

## 2014-03-26 NOTE — Consult Note (Signed)
[facility] Consult   Reason for Consult:  Worsening depression and substance abuse Referring Physician:  EDP  Samuel Atkinson is an 48 y.o. male. Total Time spent with patient: 45 minutes  Assessment: AXIS I:  Alcohol Abuse, Major Depression, Recurrent severe, Substance Abuse and Substance Induced Mood Disorder AXIS II:  Deferred AXIS III:   Past Medical History  Diagnosis Date  . Hypertension   . Seizure disorder   . Hx of substance abuse   . AVM (arteriovenous malformation)    AXIS IV:  other psychosocial or environmental problems AXIS V:  11-20 some danger of hurting self or others possible OR occasionally fails to maintain minimal personal hygiene OR gross impairment in communication  Plan:  Recommend psychiatric Inpatient admission when medically cleared.  Subjective:    HPI:  Samuel Atkinson is a 48 y.o. male patient.  Presents to [facility] with complaints of worsening depression and suicidal thoughts.  Patient states that he was brought to ED by his wife because of his 2 week drinking beng.  Patient states that he also uses cocaine.  Stressors lost of job and drug use. Patient states that he has a history of depression and tried to kill himself 2 years ago via overdose and was hospitalized in [place].  Patient denies suicidal/homicidal ideation, psychosis, and paranoia.     HPI Elements:   Location:  Worsening depression. Quality:  substance abuse. Severity:  worsening depression. Timing:  2 weeks. Review of Systems  Respiratory: Negative.   Musculoskeletal: Negative.   Neurological: Positive for seizures (not related to alcohol).  Psychiatric/Behavioral: Positive for depression and substance abuse. Negative for suicidal ideas, hallucinations and memory loss. The patient is not nervous/anxious and does not have insomnia.   All other systems reviewed and are negative.  Family History  Problem Relation Age of Onset  . Hypertension Father   . Coronary artery disease  Father   . Diabetes Mother   . Hypertension Mother   . Coronary artery disease Brother   . Healthy Brother   . Coronary artery disease Brother      Past Psychiatric History: Past Medical History  Diagnosis Date  . Hypertension   . Seizure disorder   . Hx of substance abuse   . AVM (arteriovenous malformation)     reports that he has been smoking.  He has never used smokeless tobacco. He reports that he drinks alcohol. He reports that he uses illicit drugs (Cocaine). Family History  Problem Relation Age of Onset  . Hypertension Father   . Coronary artery disease Father   . Diabetes Mother   . Hypertension Mother   . Coronary artery disease Brother   . Healthy Brother   . Coronary artery disease Brother    Family History Substance Abuse: No Family Supports: Yes, List: (Family ) Living Arrangements: Spouse/significant other Can pt return to current living arrangement?: Yes Abuse/Neglect [employer]) Physical Abuse: Denies Verbal Abuse: Denies Sexual Abuse: Denies Allergies:   Allergies  Allergen Reactions  . Latex Rash    ACT Assessment Complete:  Yes:    Educational Status    Risk to Self: Risk to self with the past 6 months Suicidal Ideation: No Suicidal Intent: No-Not Currently/Within Last 6 Months Is patient at risk for suicide?: No Suicidal Plan?: No Access to Means: No What has been your use of drugs/alcohol within the last 12 months?: Binge uses alcohol and crack/cocaine Previous Attempts/Gestures: Yes How many times?: 1 Other Self Harm Risks: Substance abuse Triggers  for Past Attempts: Unpredictable Intentional Self Injurious Behavior: None Family Suicide History: No Recent stressful life event(s): Other (Comment) Persecutory voices/beliefs?: No Depression: Yes Depression Symptoms: Despondent;Fatigue;Guilt;Loss of interest in usual pleasures;Feeling angry/irritable;Feeling worthless/self pity Substance abuse history and/or treatment for substance abuse?:  Yes Suicide prevention information given to non-admitted patients: Not applicable  Risk to Others: Risk to Others within the past 6 months Homicidal Ideation: No Thoughts of Harm to Others: No Current Homicidal Intent: No Current Homicidal Plan: No Access to Homicidal Means: No Identified Victim: NA History of harm to others?: No Assessment of Violence: None Noted Violent Behavior Description: Pt is calm and cooperative at this time. Does patient have access to weapons?: No Criminal Charges Pending?: No Does patient have a court date: No  Abuse: Abuse/Neglect Assessment (Assessment to be complete while patient is alone) Physical Abuse: Denies Verbal Abuse: Denies Sexual Abuse: Denies Exploitation of patient/patient's resources: Denies Self-Neglect: Denies  Prior Inpatient Therapy: Prior Inpatient Therapy Prior Inpatient Therapy: Yes Prior Therapy Dates: 2000 Prior Therapy Facilty/Provider(s): [place] Reason for Treatment: Detox  Prior Outpatient Therapy: Prior Outpatient Therapy Prior Outpatient Therapy: Yes Prior Therapy Dates: 2000 Prior Therapy Facilty/Provider(s): [place] Reason for Treatment: SA  Additional Information: Additional Information 1:1 In Past 12 Months?: Yes CIRT Risk: No Elopement Risk: No                  Objective: Blood pressure 119/64, pulse 64, temperature 98.1 F (36.7 C), temperature source Oral, resp. rate 16, SpO2 99.00%.There is no weight on file to calculate BMI. Results for orders placed during the hospital encounter of 03/25/14 (from the past 72 hour(s))  CBC WITH DIFFERENTIAL     Status: Abnormal   Collection Time    03/25/14 12:51 PM      Result Value Ref Range   WBC 9.7  4.0 - 10.5 K/uL   RBC 5.29  4.22 - 5.81 MIL/uL   Hemoglobin 17.8 (*) 13.0 - 17.0 g/dL   HCT 50.3  39.0 - 52.0 %   MCV 95.1  78.0 - 100.0 fL   MCH 33.6  26.0 - 34.0 pg   MCHC 35.4  30.0 - 36.0 g/dL   RDW 12.7  11.5 - 15.5 %   Platelets 204   150 - 400 K/uL   Neutrophils Relative % 55  43 - 77 %   Neutro Abs 5.4  1.7 - 7.7 K/uL   Lymphocytes Relative 32  12 - 46 %   Lymphs Abs 3.1  0.7 - 4.0 K/uL   Monocytes Relative 12  3 - 12 %   Monocytes Absolute 1.1 (*) 0.1 - 1.0 K/uL   Eosinophils Relative 1  0 - 5 %   Eosinophils Absolute 0.1  0.0 - 0.7 K/uL   Basophils Relative 0  0 - 1 %   Basophils Absolute 0.0  0.0 - 0.1 K/uL  COMPREHENSIVE METABOLIC PANEL     Status: Abnormal   Collection Time    03/25/14 12:51 PM      Result Value Ref Range   Sodium 138  137 - 147 mEq/L   Potassium 4.6  3.7 - 5.3 mEq/L   Comment: MODERATE HEMOLYSIS     HEMOLYSIS AT THIS LEVEL MAY AFFECT RESULT   Chloride 96  96 - 112 mEq/L   CO2 20  19 - 32 mEq/L   Glucose, Bld 109 (*) 70 - 99 mg/dL   BUN 16  6 - 23 mg/dL   Creatinine, Ser 1.18  0.50 -  1.35 mg/dL   Calcium 9.5  8.4 - 10.5 mg/dL   Total Protein 8.3  6.0 - 8.3 g/dL   Albumin 4.1  3.5 - 5.2 g/dL   AST 46 (*) 0 - 37 U/L   Comment: MODERATE HEMOLYSIS     HEMOLYSIS AT THIS LEVEL MAY AFFECT RESULT   ALT 30  0 - 53 U/L   Comment: MODERATE HEMOLYSIS     HEMOLYSIS AT THIS LEVEL MAY AFFECT RESULT   Alkaline Phosphatase 79  39 - 117 U/L   Total Bilirubin 1.0  0.3 - 1.2 mg/dL   GFR calc non Af Amer 71 (*) >90 mL/min   GFR calc Af Amer 83 (*) >90 mL/min   Comment: (NOTE)     The eGFR has been calculated using the CKD EPI equation.     This calculation has not been validated in all clinical situations.     eGFR's persistently <90 mL/min signify possible Chronic Kidney     Disease.   Anion gap 22 (*) 5 - 15  ETHANOL     Status: Abnormal   Collection Time    03/25/14 12:51 PM      Result Value Ref Range   Alcohol, Ethyl (B) 173 (*) 0 - 11 mg/dL   Comment:            LOWEST DETECTABLE LIMIT FOR     SERUM ALCOHOL IS 11 mg/dL     FOR MEDICAL PURPOSES ONLY  CBG MONITORING, ED     Status: Abnormal   Collection Time    03/25/14  1:33 PM      Result Value Ref Range   Glucose-Capillary 59 (*)  70 - 99 mg/dL  URINE RAPID DRUG SCREEN (HOSP PERFORMED)     Status: Abnormal   Collection Time    03/25/14  1:48 PM      Result Value Ref Range   Opiates NONE DETECTED  NONE DETECTED   Cocaine POSITIVE (*) NONE DETECTED   Benzodiazepines NONE DETECTED  NONE DETECTED   Amphetamines NONE DETECTED  NONE DETECTED   Tetrahydrocannabinol NONE DETECTED  NONE DETECTED   Barbiturates NONE DETECTED  NONE DETECTED   Comment:            DRUG SCREEN FOR MEDICAL PURPOSES     ONLY.  IF CONFIRMATION IS NEEDED     FOR ANY PURPOSE, NOTIFY LAB     WITHIN 5 DAYS.                LOWEST DETECTABLE LIMITS     FOR URINE DRUG SCREEN     Drug Class       Cutoff (ng/mL)     Amphetamine      1000     Barbiturate      200     Benzodiazepine   024     Tricyclics       097     Opiates          300     Cocaine          300     THC              50  CBG MONITORING, ED     Status: Abnormal   Collection Time    03/25/14  5:25 PM      Result Value Ref Range   Glucose-Capillary 100 (*) 70 - 99 mg/dL   Labs are reviewed see above values.  Medications reviewed.  Librium  started for alcohol detox  Current Facility-Administered Medications  Medication Dose Route Frequency Provider Last Rate Last Dose  . albuterol (PROVENTIL HFA;VENTOLIN HFA) 108 (90 BASE) MCG/ACT inhaler 1-2 puff  1-2 puff Inhalation Q6H PRN Shuvon Rankin, NP      . amLODipine (NORVASC) tablet 10 mg  10 mg Oral Daily Shuvon Rankin, NP   10 mg at 03/26/14 1303  . chlordiazePOXIDE (LIBRIUM) capsule 25 mg  25 mg Oral Q6H PRN Shuvon Rankin, NP      . chlordiazePOXIDE (LIBRIUM) capsule 25 mg  25 mg Oral QID Shuvon Rankin, NP   25 mg at 03/26/14 1304   Followed by  . [START ON 03/27/2014] chlordiazePOXIDE (LIBRIUM) capsule 25 mg  25 mg Oral TID Shuvon Rankin, NP       Followed by  . [START ON 03/28/2014] chlordiazePOXIDE (LIBRIUM) capsule 25 mg  25 mg Oral BH-qamhs Shuvon Rankin, NP       Followed by  . [START ON 03/29/2014] chlordiazePOXIDE (LIBRIUM)  capsule 25 mg  25 mg Oral Daily Shuvon Rankin, NP      . escitalopram (LEXAPRO) tablet 10 mg  10 mg Oral Daily Shuvon Rankin, NP   10 mg at 03/26/14 1255  . hydrOXYzine (ATARAX/VISTARIL) tablet 25 mg  25 mg Oral Q6H PRN Shuvon Rankin, NP      . ibuprofen (ADVIL,MOTRIN) tablet 600 mg  600 mg Oral Q6H PRN Virgel Manifold, MD   600 mg at 03/25/14 1901  . levETIRAcetam (KEPPRA XR) 24 hr tablet 500 mg  500 mg Oral Daily Shuvon Rankin, NP   500 mg at 03/26/14 1303  . loperamide (IMODIUM) capsule 2-4 mg  2-4 mg Oral PRN Shuvon Rankin, NP      . multivitamin with minerals tablet 1 tablet  1 tablet Oral Daily Shuvon Rankin, NP   1 tablet at 03/26/14 1255  . ondansetron (ZOFRAN-ODT) disintegrating tablet 4 mg  4 mg Oral Q6H PRN Shuvon Rankin, NP      . [START ON 03/27/2014] thiamine (VITAMIN B-1) tablet 100 mg  100 mg Oral Daily Shuvon Rankin, NP      . tiotropium (SPIRIVA) inhalation capsule 18 mcg  18 mcg Inhalation Daily PRN Shuvon Rankin, NP       Current Outpatient Prescriptions  Medication Sig Dispense Refill  . albuterol (PROVENTIL HFA;VENTOLIN HFA) 108 (90 BASE) MCG/ACT inhaler Inhale 1-2 puffs into the lungs every 6 (six) hours as needed for wheezing or shortness of breath.      Marland Kitchen amLODipine (NORVASC) 10 MG tablet Take 10 mg by mouth daily.      . B Complex-Biotin-FA (SUPER B-50 COMPLEX) CAPS Take 1 capsule by mouth daily.      Marland Kitchen CALCIUM MAGNESIUM 750 PO Take 1 tablet by mouth daily.      . Cholecalciferol (VITAMIN D-3) 5000 UNITS TABS Take 5,000 Units by mouth daily.      Marland Kitchen escitalopram (LEXAPRO) 10 MG tablet Take 10 mg by mouth daily.      . folic acid (FOLVITE) 1 MG tablet Take 1 mg by mouth daily.      Marland Kitchen levETIRAcetam (KEPPRA XR) 500 MG 24 hr tablet Take 500 mg by mouth daily.      Marland Kitchen tiotropium (SPIRIVA) 18 MCG inhalation capsule Place 18 mcg into inhaler and inhale daily as needed (for shortness of breath).        Psychiatric Specialty Exam:     Blood pressure 119/64, pulse 64,  temperature 98.1 F (36.7 C), temperature source  Oral, resp. rate 16, SpO2 99.00%.There is no weight on file to calculate BMI.  General Appearance: Casual  Eye Contact::  Fair  Speech:  Clear and Coherent and Normal Rate  Volume:  Normal  Mood:  Depressed  Affect:  Congruent and Depressed  Thought Process:  Circumstantial and Goal Directed  Orientation:  Full (Time, Place, and Person)  Thought Content:  "Just depressed"'  Suicidal Thoughts:  No  Homicidal Thoughts:  No  Memory:  Immediate;   Good Recent;   Good Remote;   Good  Judgement:  Fair  Insight:  Lacking  Psychomotor Activity:  Tremor  Concentration:  Fair  Recall:  Good  Fund of Knowledge:Good  Language: Good  Akathisia:  No  Handed:  Right  AIMS (if indicated):     Assets:  Communication Skills Desire for Improvement Housing Social Support Transportation  Sleep:      Musculoskeletal: Strength & Muscle Tone: within normal limits Gait & Station: normal Patient leans: N/A  Treatment Plan Summary: Daily contact with patient to assess and evaluate symptoms and progress in treatment Medication management Inpatient treatment recommended for depression and substance abuse  Earleen Newport, FNP-BC 03/26/2014 2:41 PM  I have personally seen the patient and agreed with the findings and involved in the treatment plan. Berniece Andreas, MD

## 2014-03-26 NOTE — ED Notes (Signed)
Report called to RN Lowella Bandy, Observation Unit, [place] transport requested.

## 2014-03-26 NOTE — ED Notes (Signed)
Pt resting at present, denies SI at present, pending report to [facility], Observation Unit and transfer.

## 2014-03-26 NOTE — ED Provider Notes (Signed)
Patient sleeping is arousable. Alert pleasant cooperative ambulates without difficulty  Plan transfer [facility] for observation  Results for orders placed during the hospital encounter of 03/25/14  CBC WITH DIFFERENTIAL      Result Value Ref Range   WBC 9.7  4.0 - 10.5 K/uL   RBC 5.29  4.22 - 5.81 MIL/uL   Hemoglobin 17.8 (*) 13.0 - 17.0 g/dL   HCT 16.1  09.6 - 04.5 %   MCV 95.1  78.0 - 100.0 fL   MCH 33.6  26.0 - 34.0 pg   MCHC 35.4  30.0 - 36.0 g/dL   RDW 40.9  81.1 - 91.4 %   Platelets 204  150 - 400 K/uL   Neutrophils Relative % 55  43 - 77 %   Neutro Abs 5.4  1.7 - 7.7 K/uL   Lymphocytes Relative 32  12 - 46 %   Lymphs Abs 3.1  0.7 - 4.0 K/uL   Monocytes Relative 12  3 - 12 %   Monocytes Absolute 1.1 (*) 0.1 - 1.0 K/uL   Eosinophils Relative 1  0 - 5 %   Eosinophils Absolute 0.1  0.0 - 0.7 K/uL   Basophils Relative 0  0 - 1 %   Basophils Absolute 0.0  0.0 - 0.1 K/uL  COMPREHENSIVE METABOLIC PANEL      Result Value Ref Range   Sodium 138  137 - 147 mEq/L   Potassium 4.6  3.7 - 5.3 mEq/L   Chloride 96  96 - 112 mEq/L   CO2 20  19 - 32 mEq/L   Glucose, Bld 109 (*) 70 - 99 mg/dL   BUN 16  6 - 23 mg/dL   Creatinine, Ser 7.82  0.50 - 1.35 mg/dL   Calcium 9.5  8.4 - 95.6 mg/dL   Total Protein 8.3  6.0 - 8.3 g/dL   Albumin 4.1  3.5 - 5.2 g/dL   AST 46 (*) 0 - 37 U/L   ALT 30  0 - 53 U/L   Alkaline Phosphatase 79  39 - 117 U/L   Total Bilirubin 1.0  0.3 - 1.2 mg/dL   GFR calc non Af Amer 71 (*) >90 mL/min   GFR calc Af Amer 83 (*) >90 mL/min   Anion gap 22 (*) 5 - 15  URINE RAPID DRUG SCREEN (HOSP PERFORMED)      Result Value Ref Range   Opiates NONE DETECTED  NONE DETECTED   Cocaine POSITIVE (*) NONE DETECTED   Benzodiazepines NONE DETECTED  NONE DETECTED   Amphetamines NONE DETECTED  NONE DETECTED   Tetrahydrocannabinol NONE DETECTED  NONE DETECTED   Barbiturates NONE DETECTED  NONE DETECTED  ETHANOL      Result Value Ref Range   Alcohol, Ethyl (B) 173 (*) 0 - 11 mg/dL   CBG MONITORING, ED      Result Value Ref Range   Glucose-Capillary 59 (*) 70 - 99 mg/dL  CBG MONITORING, ED      Result Value Ref Range   Glucose-Capillary 100 (*) 70 - 99 mg/dL   Ct Head Wo Contrast  03/25/2014   CLINICAL DATA:  Headache.  History of AVM.  EXAM: CT HEAD WITHOUT CONTRAST  TECHNIQUE: Contiguous axial images were obtained from the base of the skull through the vertex without intravenous contrast.  COMPARISON:  None  FINDINGS: There is no intra or extra-axial fluid collection or mass lesion. The basilar cisterns and ventricles have a normal appearance. There is no CT evidence  for acute infarction or hemorrhage. There is significant artifact from coil in the right frontal lobe region. Bone windows are otherwise unremarkable.  IMPRESSION: No evidence for acute  abnormality.   Electronically Signed   By: Rosalie Gums M.D.   On: 03/25/2014 16:15     Doug Sou, MD 03/26/14 2304

## 2014-03-27 ENCOUNTER — Encounter (HOSPITAL_COMMUNITY): Payer: Self-pay | Admitting: *Deleted

## 2014-03-27 DIAGNOSIS — F332 Major depressive disorder, recurrent severe without psychotic features: Secondary | ICD-10-CM | POA: Diagnosis not present

## 2014-03-27 MED ORDER — SUPER B-50 COMPLEX PO CAPS: 1.0000 | ORAL_CAPSULE | Freq: Every day | ORAL | Status: DC

## 2014-03-27 MED ORDER — ALUM & MAG HYDROXIDE-SIMETH 200-200-20 MG/5ML PO SUSP: 30.0000 mL | ORAL | Status: DC | PRN

## 2014-03-27 MED ORDER — AMLODIPINE BESYLATE 10 MG PO TABS: 10.0000 mg | ORAL_TABLET | Freq: Every day | ORAL | Status: DC

## 2014-03-27 MED ORDER — ESCITALOPRAM OXALATE 10 MG PO TABS: 10.0000 mg | ORAL_TABLET | Freq: Every day | ORAL | Status: DC

## 2014-03-27 MED ORDER — MAGNESIUM HYDROXIDE 400 MG/5ML PO SUSP: 30.0000 mL | Freq: Every day | ORAL | Status: DC | PRN

## 2014-03-27 MED ORDER — CALCIUM-MAGNESIUM 300-300 MG PO TABS: 1.0000 | ORAL_TABLET | Freq: Every day | ORAL | Status: DC

## 2014-03-27 MED ORDER — VITAMIN D-3 125 MCG (5000 UT) PO TABS: 5000.0000 [IU] | ORAL_TABLET | Freq: Every day | ORAL | Status: DC

## 2014-03-27 MED ORDER — LEVETIRACETAM ER 500 MG PO TB24: 500.0000 mg | ORAL_TABLET | Freq: Every day | ORAL | Status: DC

## 2014-03-27 MED ORDER — ACETAMINOPHEN 325 MG PO TABS
650.0000 mg | ORAL_TABLET | Freq: Four times a day (QID) | ORAL | Status: DC | PRN
  Administered 2014-03-27: 650 mg via ORAL
  Filled 2014-03-27: qty 2

## 2014-03-27 NOTE — Progress Notes (Signed)
D:Patient admitted to the unit from [facility] with a history of alcohol binging and cocaine use. Patient appear dull and weak. Speech soft and slow. Patient denied pain, SI, AH/VH. Patient denied any withdrawal symptoms from alcohol and cocaine. Patient  Stated "It's just a one time thing I did 'cos of I was going through; I spend $400.00 worth of cocaine and alcohol last week". Pt. Sighed heavily and could no longer talk. nutrition and drink offered to patient. A: Support and encouragement offered to patient. Patient encouraged to verbalize needs to staff. Safety maintained by every 15 minutes check. Patient encouraged to comply with the treatment plan. R: Patient very receptive and cooperative.      Will continue to monitor patient.

## 2014-03-27 NOTE — Progress Notes (Signed)
[facility] INPATIENT:  Family/Significant Other Suicide Prevention Education  Suicide Prevention Education:  Patient Refusal for Family/Significant Other Suicide Prevention Education: The patient Samuel Atkinson has refused to provide written consent for family/significant other to be provided Family/Significant Other Suicide Prevention Education during admission and/or prior to discharge.  Physician notified.  Glenice Laine B 03/27/2014, 3:04 AM

## 2014-03-27 NOTE — Discharge Summary (Signed)
Patient seen, evaluated by me. Patient contracts for safety, wants outpatient care

## 2014-03-27 NOTE — H&P (Signed)
Patient admitted to OBS for monitoring and stabilization

## 2014-03-27 NOTE — Plan of Care (Signed)
[facility] Observation Crisis Plan  Reason for Crisis Plan:  Crisis Stabilization and Substance Abuse   Plan of Care:  Referral for Telepsychiatry/Psychiatric Consult  Family Support:    SPOUSE  Current Living Environment:  Living Arrangements: Spouse/significant other  Insurance:   Hospital Account   Name Acct ID Class Status Primary Coverage   Samuel Atkinson, Samuel Atkinson [id number] BEHAVIORAL HEALTH OBSERVATION Open GENERIC COMMERCIAL - GENERIC COMMERCIAL        Guarantor Account (for Hospital Account [id number])   Name Relation to Pt Service Area Active? Acct Type   Samuel Atkinson Self [facility] Yes Behavioral Health   Address Phone       [address] [place], [place] [address] [phone](H) [phone])          Coverage Information (for Hospital Account [id number])   F/O Payor/Plan Precert #   [facility] COMMERCIAL/GENERIC COMMERCIAL    Subscriber Subscriber #   Samuel Atkinson, Samuel Atkinson [id number]   Address Phone   [address] [place], [place] [address] [phone]      Legal Guardian:   SELF  Primary Care Provider:  [name], MD  Current Outpatient Providers:    Psychiatrist:     Counselor/Therapist:     Compliant with Medications:  Yes  Additional Information:   Samuel Atkinson 9/28/20153:01 AM

## 2014-03-27 NOTE — Plan of Care (Signed)
[facility] Observation Crisis Plan  Reason for Crisis Plan:  Substance Abuse   Plan of Care:  Referral for Substance Abuse  Family Support:    Wife  Current Living Environment:  Living Arrangements: Spouse/significant other; Wife; pt can return to household.  Insurance:  [facility] Account   Name Acct ID Class Status Primary Coverage   Samuel Atkinson, Samuel Atkinson [id number] BEHAVIORAL HEALTH OBSERVATION Open GENERIC COMMERCIAL - GENERIC COMMERCIAL        Guarantor Account (for Hospital Account [id number])   Name Relation to Pt Service Area Active? Acct Type   Samuel Atkinson Self [facility] Yes Behavioral Health   Address Phone       [address] [place], [place] [address] [phone](H) [phone])          Coverage Information (for Hospital Account [id number])   F/O Payor/Plan Precert #   [facility] COMMERCIAL/GENERIC COMMERCIAL    Subscriber Subscriber #   Samuel Atkinson, Samuel Atkinson [id number]   Address Phone   [address] [place], [place] [address] [phone]      Legal Guardian:   Self  Primary Care Provider:  [name], MD  Current Outpatient Providers:  None  Psychiatrist:   None  Counselor/Therapist:   None  Compliant with Medications:  No; "Sometimes"  Additional Information: After consulting with Samuel Head, NP it has been determined that pt does not present a life threatening danger to himself or others, and that psychiatric hospitalization is not indicated for him at this time.  He would, however, benefit from admission to a residential substance abuse rehabilitation facility.  Pt has been accepted to the [facility], per Samuel Atkinson.  She requests to be informed of an approximate departure time from [facility].  Transportation to the facility has been discussed with the pt and it is his preference to be transported by his wife.  With pt's signed consent I spoke to her, providing the address and phone number of the facility.  She reports that she  will be at [facility] after completing her work day at 17:30 today.  I have called Samuel Atkinson back to inform her of these details.   Samuel Atkinson also requests that report be called as pt is departing from [facility].  The phone number to call is [phone], option 4; from there ask for the detox nurse.    Samuel Canning, MA Triage Specialist Samuel Atkinson 9/28/20154:31 PM

## 2014-03-27 NOTE — H&P (Signed)
[facility] OBS UNIT H&P   *I have reviewed the comprehensive assessment by Earleen Newport, NP and I concur with its findings. Assessment details have been updated to reflect current patient status.   Samuel Atkinson is an 48 y.o. male. Total Time spent with patient: 45 minutes  Assessment: AXIS I:  Alcohol Abuse, Major Depression, Recurrent severe, Substance Abuse and Substance Induced Mood Disorder AXIS II:  Deferred AXIS III:   Past Medical History  Diagnosis Date  . Hypertension   . Seizure disorder   . Hx of substance abuse   . AVM (arteriovenous malformation)    AXIS IV:  other psychosocial or environmental problems AXIS V:  51-60 moderate symptoms  Plan:  No evidence of imminent risk to self or others at present.   Patient does not meet criteria for psychiatric inpatient admission. Supportive therapy provided about ongoing stressors. Refer to IOP. Discussed crisis plan, support from social network, calling 911, coming to the Emergency Department, and calling Suicide Hotline.  Subjective:  Pt seen and chart reviewed. Pt denies SI, HI, and AVH, contracts for safety. Pt reports reports an interest in discharging with a desire to find inpatient this week or followup outpatient if this is not available. To be assisted by Gershon Mussel with [facility].   HPI:  Samuel Atkinson is a 48 y.o. male patient.  Presents to [facility] with complaints of worsening depression and suicidal thoughts.  Patient states that he was brought to ED by his wife because of his 2 week drinking beng.  Patient states that he also uses cocaine.  Stressors lost of job and drug use. Patient states that he has a history of depression and tried to kill himself 2 years ago via overdose and was hospitalized in [place].  Patient denies suicidal/homicidal ideation, psychosis, and paranoia.     HPI Elements:   Location:  Worsening depression. Quality:  substance abuse. Severity:  worsening depression. Timing:  2 weeks. Review of Systems   Constitutional: Negative.   HENT: Negative.   Eyes: Negative.   Respiratory: Negative.   Cardiovascular: Negative.   Gastrointestinal: Negative.   Genitourinary: Negative.   Musculoskeletal: Negative.   Skin: Negative.   Neurological: Positive for seizures (not related to alcohol).  Endo/Heme/Allergies: Negative.   Psychiatric/Behavioral: Positive for depression and substance abuse. Negative for suicidal ideas, hallucinations and memory loss. The patient is not nervous/anxious and does not have insomnia.   All other systems reviewed and are negative.  Family History  Problem Relation Age of Onset  . Hypertension Father   . Coronary artery disease Father   . Diabetes Mother   . Hypertension Mother   . Coronary artery disease Brother   . Healthy Brother   . Coronary artery disease Brother      Past Psychiatric History: Past Medical History  Diagnosis Date  . Hypertension   . Seizure disorder   . Hx of substance abuse   . AVM (arteriovenous malformation)     reports that he has been smoking.  He has never used smokeless tobacco. He reports that he drinks alcohol. He reports that he uses illicit drugs (Cocaine). Family History  Problem Relation Age of Onset  . Hypertension Father   . Coronary artery disease Father   . Diabetes Mother   . Hypertension Mother   . Coronary artery disease Brother   . Healthy Brother   . Coronary artery disease Brother      Living Arrangements: Spouse/significant other   Abuse/Neglect [facility]) Physical Abuse: Denies Verbal  Abuse: Denies Sexual Abuse: Denies Allergies:   Allergies  Allergen Reactions  . Latex Rash    ACT Assessment Complete:  Yes:    Educational Status    Risk to Self: Risk to self with the past 6 months Is patient at risk for suicide?: No Substance abuse history and/or treatment for substance abuse?: Yes  Risk to Others:    Abuse: Abuse/Neglect Assessment (Assessment to be complete while patient is  alone) Physical Abuse: Denies Verbal Abuse: Denies Sexual Abuse: Denies Exploitation of patient/patient's resources: Denies Self-Neglect: Denies  Prior Inpatient Therapy:    Prior Outpatient Therapy:    Additional Information:      Objective: Blood pressure 106/54, pulse 62, temperature 98 F (36.7 C), temperature source Oral, resp. rate 17, height _0  (1.727 m), weight 81.647 kg (180 lb), SpO2 98.00%.Body mass index is 27.38 kg/(m^2). Results for orders placed during the hospital encounter of 03/25/14 (from the past 72 hour(s))  CBC WITH DIFFERENTIAL     Status: Abnormal   Collection Time    03/25/14 12:51 PM      Result Value Ref Range   WBC 9.7  4.0 - 10.5 K/uL   RBC 5.29  4.22 - 5.81 MIL/uL   Hemoglobin 17.8 (*) 13.0 - 17.0 g/dL   HCT 50.3  39.0 - 52.0 %   MCV 95.1  78.0 - 100.0 fL   MCH 33.6  26.0 - 34.0 pg   MCHC 35.4  30.0 - 36.0 g/dL   RDW 12.7  11.5 - 15.5 %   Platelets 204  150 - 400 K/uL   Neutrophils Relative % 55  43 - 77 %   Neutro Abs 5.4  1.7 - 7.7 K/uL   Lymphocytes Relative 32  12 - 46 %   Lymphs Abs 3.1  0.7 - 4.0 K/uL   Monocytes Relative 12  3 - 12 %   Monocytes Absolute 1.1 (*) 0.1 - 1.0 K/uL   Eosinophils Relative 1  0 - 5 %   Eosinophils Absolute 0.1  0.0 - 0.7 K/uL   Basophils Relative 0  0 - 1 %   Basophils Absolute 0.0  0.0 - 0.1 K/uL  COMPREHENSIVE METABOLIC PANEL     Status: Abnormal   Collection Time    03/25/14 12:51 PM      Result Value Ref Range   Sodium 138  137 - 147 mEq/L   Potassium 4.6  3.7 - 5.3 mEq/L   Comment: MODERATE HEMOLYSIS     HEMOLYSIS AT THIS LEVEL MAY AFFECT RESULT   Chloride 96  96 - 112 mEq/L   CO2 20  19 - 32 mEq/L   Glucose, Bld 109 (*) 70 - 99 mg/dL   BUN 16  6 - 23 mg/dL   Creatinine, Ser 1.18  0.50 - 1.35 mg/dL   Calcium 9.5  8.4 - 10.5 mg/dL   Total Protein 8.3  6.0 - 8.3 g/dL   Albumin 4.1  3.5 - 5.2 g/dL   AST 46 (*) 0 - 37 U/L   Comment: MODERATE HEMOLYSIS     HEMOLYSIS AT THIS LEVEL MAY AFFECT  RESULT   ALT 30  0 - 53 U/L   Comment: MODERATE HEMOLYSIS     HEMOLYSIS AT THIS LEVEL MAY AFFECT RESULT   Alkaline Phosphatase 79  39 - 117 U/L   Total Bilirubin 1.0  0.3 - 1.2 mg/dL   GFR calc non Af Amer 71 (*) >90 mL/min   GFR calc Af Wyvonnia Lora  83 (*) >90 mL/min   Comment: (NOTE)     The eGFR has been calculated using the CKD EPI equation.     This calculation has not been validated in all clinical situations.     eGFR's persistently <90 mL/min signify possible Chronic Kidney     Disease.   Anion gap 22 (*) 5 - 15  ETHANOL     Status: Abnormal   Collection Time    03/25/14 12:51 PM      Result Value Ref Range   Alcohol, Ethyl (B) 173 (*) 0 - 11 mg/dL   Comment:            LOWEST DETECTABLE LIMIT FOR     SERUM ALCOHOL IS 11 mg/dL     FOR MEDICAL PURPOSES ONLY  CBG MONITORING, ED     Status: Abnormal   Collection Time    03/25/14  1:33 PM      Result Value Ref Range   Glucose-Capillary 59 (*) 70 - 99 mg/dL  URINE RAPID DRUG SCREEN (HOSP PERFORMED)     Status: Abnormal   Collection Time    03/25/14  1:48 PM      Result Value Ref Range   Opiates NONE DETECTED  NONE DETECTED   Cocaine POSITIVE (*) NONE DETECTED   Benzodiazepines NONE DETECTED  NONE DETECTED   Amphetamines NONE DETECTED  NONE DETECTED   Tetrahydrocannabinol NONE DETECTED  NONE DETECTED   Barbiturates NONE DETECTED  NONE DETECTED   Comment:            DRUG SCREEN FOR MEDICAL PURPOSES     ONLY.  IF CONFIRMATION IS NEEDED     FOR ANY PURPOSE, NOTIFY LAB     WITHIN 5 DAYS.                LOWEST DETECTABLE LIMITS     FOR URINE DRUG SCREEN     Drug Class       Cutoff (ng/mL)     Amphetamine      1000     Barbiturate      200     Benzodiazepine   915     Tricyclics       056     Opiates          300     Cocaine          300     THC              50  CBG MONITORING, ED     Status: Abnormal   Collection Time    03/25/14  5:25 PM      Result Value Ref Range   Glucose-Capillary 100 (*) 70 - 99 mg/dL   Labs are  reviewed see above values.  Medications reviewed.  Librium started for alcohol detox  Current Facility-Administered Medications  Medication Dose Route Frequency Provider Last Rate Last Dose  . acetaminophen (TYLENOL) tablet 650 mg  650 mg Oral Q6H PRN Hampton Abbot, MD   650 mg at 03/27/14 0752  . albuterol (PROVENTIL HFA;VENTOLIN HFA) 108 (90 BASE) MCG/ACT inhaler 2 puff  2 puff Inhalation Q6H PRN Hampton Abbot, MD      . alum & mag hydroxide-simeth (MAALOX/MYLANTA) 200-200-20 MG/5ML suspension 30 mL  30 mL Oral Q4H PRN Hampton Abbot, MD      . amLODipine (NORVASC) tablet 10 mg  10 mg Oral Daily Hampton Abbot, MD   10 mg at 03/27/14 0746  . B-complex  with vitamin C tablet 1 tablet  1 tablet Oral Daily Hampton Abbot, MD   1 tablet at 03/27/14 0745  . chlordiazePOXIDE (LIBRIUM) capsule 25 mg  25 mg Oral Q6H PRN Hampton Abbot, MD      . cholecalciferol (VITAMIN D) tablet 5,000 Units  5,000 Units Oral Daily Hampton Abbot, MD   5,000 Units at 03/27/14 0746  . escitalopram (LEXAPRO) tablet 10 mg  10 mg Oral Daily Hampton Abbot, MD   10 mg at 03/27/14 0746  . folic acid (FOLVITE) tablet 1 mg  1 mg Oral Daily Hampton Abbot, MD   1 mg at 03/27/14 0746  . hydrOXYzine (ATARAX/VISTARIL) tablet 25 mg  25 mg Oral Q6H PRN Hampton Abbot, MD      . levETIRAcetam (KEPPRA XR) 24 hr tablet 500 mg  500 mg Oral Daily Hampton Abbot, MD   500 mg at 03/27/14 0746  . loperamide (IMODIUM) capsule 2-4 mg  2-4 mg Oral PRN Hampton Abbot, MD      . magnesium hydroxide (MILK OF MAGNESIA) suspension 30 mL  30 mL Oral Daily PRN Hampton Abbot, MD      . multivitamin with minerals tablet 1 tablet  1 tablet Oral Daily Hampton Abbot, MD   1 tablet at 03/27/14 0745  . ondansetron (ZOFRAN-ODT) disintegrating tablet 4 mg  4 mg Oral Q6H PRN Hampton Abbot, MD      . thiamine (VITAMIN B-1) tablet 100 mg  100 mg Oral Daily Hampton Abbot, MD   100 mg at 03/27/14 0746  . tiotropium (SPIRIVA) inhalation capsule 18 mcg  18 mcg Inhalation Daily  Hampton Abbot, MD   18 mcg at 03/27/14 0747    Psychiatric Specialty Exam:     Blood pressure 106/54, pulse 62, temperature 98 F (36.7 C), temperature source Oral, resp. rate 17, height _0  (1.727 m), weight 81.647 kg (180 lb), SpO2 98.00%.Body mass index is 27.38 kg/(m^2).  General Appearance: Casual  Eye Contact::  Fair  Speech:  Clear and Coherent and Normal Rate  Volume:  Normal  Mood:  Depressed  Affect:  Congruent and Depressed  Thought Process:  Circumstantial and Goal Directed  Orientation:  Full (Time, Place, and Person)  Thought Content:  WDL  Suicidal Thoughts:  No  Homicidal Thoughts:  No  Memory:  Immediate;   Good Recent;   Good Remote;   Good  Judgement:  Fair  Insight:  Lacking  Psychomotor Activity:  Tremor  Concentration:  Fair  Recall:  Good  Fund of Knowledge:Good  Language: Good  Akathisia:  No  Handed:  Right  AIMS (if indicated):     Assets:  Communication Skills Desire for Improvement Housing Social Support Transportation  Sleep:      Musculoskeletal: Strength & Muscle Tone: within normal limits Gait & Station: normal Patient leans: N/A  Treatment Plan Summary: Discharge home with outpatient resources. To be assisted by Tom with [facility].   Benjamine Mola, FNP-BC 03/27/2014 3:18 PM

## 2014-03-27 NOTE — Discharge Summary (Signed)
[facility] OBS UNIT DISCHARGE SUMMARY   Samuel Atkinson is an 48 y.o. male. Total Time spent with patient: 45 minutes  Assessment: AXIS I:  Alcohol Abuse, Major Depression, Recurrent severe, Substance Abuse and Substance Induced Mood Disorder AXIS II:  Deferred AXIS III:   Past Medical History  Diagnosis Date  . Hypertension   . Seizure disorder   . Hx of substance abuse   . AVM (arteriovenous malformation)    AXIS IV:  other psychosocial or environmental problems AXIS V:  51-60 moderate symptoms  Plan:  No evidence of imminent risk to self or others at present.   Patient does not meet criteria for psychiatric inpatient admission. Supportive therapy provided about ongoing stressors. Refer to IOP. Discussed crisis plan, support from social network, calling 911, coming to the Emergency Department, and calling Suicide Hotline.  Subjective:  Pt seen and chart reviewed. Pt denies SI, HI, and AVH, contracts for safety. Pt reports reports an interest in discharging with a desire to find inpatient this week or followup outpatient if this is not available. To be assisted by Gershon Mussel with [facility].   HPI:  Samuel Atkinson is a 48 y.o. male patient.  Presents to [facility] with complaints of worsening depression and suicidal thoughts.  Patient states that he was brought to ED by his wife because of his 2 week drinking beng.  Patient states that he also uses cocaine.  Stressors lost of job and drug use. Patient states that he has a history of depression and tried to kill himself 2 years ago via overdose and was hospitalized in [place].  Patient denies suicidal/homicidal ideation, psychosis, and paranoia.     HPI Elements:   Location:  Worsening depression. Quality:  substance abuse. Severity:  worsening depression. Timing:  2 weeks. Review of Systems  Constitutional: Negative.   HENT: Negative.   Eyes: Negative.   Respiratory: Negative.   Cardiovascular: Negative.   Gastrointestinal: Negative.   Genitourinary:  Negative.   Musculoskeletal: Negative.   Skin: Negative.   Neurological: Positive for seizures (not related to alcohol).  Endo/Heme/Allergies: Negative.   Psychiatric/Behavioral: Positive for depression and substance abuse. Negative for suicidal ideas, hallucinations and memory loss. The patient is not nervous/anxious and does not have insomnia.   All other systems reviewed and are negative.  Family History  Problem Relation Age of Onset  . Hypertension Father   . Coronary artery disease Father   . Diabetes Mother   . Hypertension Mother   . Coronary artery disease Brother   . Healthy Brother   . Coronary artery disease Brother      Past Psychiatric History: Past Medical History  Diagnosis Date  . Hypertension   . Seizure disorder   . Hx of substance abuse   . AVM (arteriovenous malformation)     reports that he has been smoking.  He has never used smokeless tobacco. He reports that he drinks alcohol. He reports that he uses illicit drugs (Cocaine). Family History  Problem Relation Age of Onset  . Hypertension Father   . Coronary artery disease Father   . Diabetes Mother   . Hypertension Mother   . Coronary artery disease Brother   . Healthy Brother   . Coronary artery disease Brother      Living Arrangements: Spouse/significant other   Abuse/Neglect [facility]) Physical Abuse: Denies Verbal Abuse: Denies Sexual Abuse: Denies Allergies:   Allergies  Allergen Reactions  . Latex Rash    ACT Assessment Complete:  Yes:  Educational Status    Risk to Self: Risk to self with the past 6 months Is patient at risk for suicide?: No Substance abuse history and/or treatment for substance abuse?: Yes  Risk to Others:    Abuse: Abuse/Neglect Assessment (Assessment to be complete while patient is alone) Physical Abuse: Denies Verbal Abuse: Denies Sexual Abuse: Denies Exploitation of patient/patient's resources: Denies Self-Neglect: Denies  Prior Inpatient Therapy:     Prior Outpatient Therapy:    Additional Information:      Objective: Blood pressure 106/54, pulse 62, temperature 98 F (36.7 C), temperature source Oral, resp. rate 17, height $RemoveBe'5\' 8"'[id number]$  (1.727 m), weight 81.647 kg (180 lb), SpO2 98.00%.Body mass index is 27.38 kg/(m^2). Results for orders placed during the hospital encounter of 03/25/14 (from the past 72 hour(s))  CBC WITH DIFFERENTIAL     Status: Abnormal   Collection Time    03/25/14 12:51 PM      Result Value Ref Range   WBC 9.7  4.0 - 10.5 K/uL   RBC 5.29  4.22 - 5.81 MIL/uL   Hemoglobin 17.8 (*) 13.0 - 17.0 g/dL   HCT 50.3  39.0 - 52.0 %   MCV 95.1  78.0 - 100.0 fL   MCH 33.6  26.0 - 34.0 pg   MCHC 35.4  30.0 - 36.0 g/dL   RDW 12.7  11.5 - 15.5 %   Platelets 204  150 - 400 K/uL   Neutrophils Relative % 55  43 - 77 %   Neutro Abs 5.4  1.7 - 7.7 K/uL   Lymphocytes Relative 32  12 - 46 %   Lymphs Abs 3.1  0.7 - 4.0 K/uL   Monocytes Relative 12  3 - 12 %   Monocytes Absolute 1.1 (*) 0.1 - 1.0 K/uL   Eosinophils Relative 1  0 - 5 %   Eosinophils Absolute 0.1  0.0 - 0.7 K/uL   Basophils Relative 0  0 - 1 %   Basophils Absolute 0.0  0.0 - 0.1 K/uL  COMPREHENSIVE METABOLIC PANEL     Status: Abnormal   Collection Time    03/25/14 12:51 PM      Result Value Ref Range   Sodium 138  137 - 147 mEq/L   Potassium 4.6  3.7 - 5.3 mEq/L   Comment: MODERATE HEMOLYSIS     HEMOLYSIS AT THIS LEVEL MAY AFFECT RESULT   Chloride 96  96 - 112 mEq/L   CO2 20  19 - 32 mEq/L   Glucose, Bld 109 (*) 70 - 99 mg/dL   BUN 16  6 - 23 mg/dL   Creatinine, Ser 1.18  0.50 - 1.35 mg/dL   Calcium 9.5  8.4 - 10.5 mg/dL   Total Protein 8.3  6.0 - 8.3 g/dL   Albumin 4.1  3.5 - 5.2 g/dL   AST 46 (*) 0 - 37 U/L   Comment: MODERATE HEMOLYSIS     HEMOLYSIS AT THIS LEVEL MAY AFFECT RESULT   ALT 30  0 - 53 U/L   Comment: MODERATE HEMOLYSIS     HEMOLYSIS AT THIS LEVEL MAY AFFECT RESULT   Alkaline Phosphatase 79  39 - 117 U/L   Total Bilirubin 1.0  0.3 - 1.2  mg/dL   GFR calc non Af Amer 71 (*) >90 mL/min   GFR calc Af Amer 83 (*) >90 mL/min   Comment: (NOTE)     The eGFR has been calculated using the CKD EPI equation.     This  calculation has not been validated in all clinical situations.     eGFR's persistently <90 mL/min signify possible Chronic Kidney     Disease.   Anion gap 22 (*) 5 - 15  ETHANOL     Status: Abnormal   Collection Time    03/25/14 12:51 PM      Result Value Ref Range   Alcohol, Ethyl (B) 173 (*) 0 - 11 mg/dL   Comment:            LOWEST DETECTABLE LIMIT FOR     SERUM ALCOHOL IS 11 mg/dL     FOR MEDICAL PURPOSES ONLY  CBG MONITORING, ED     Status: Abnormal   Collection Time    03/25/14  1:33 PM      Result Value Ref Range   Glucose-Capillary 59 (*) 70 - 99 mg/dL  URINE RAPID DRUG SCREEN (HOSP PERFORMED)     Status: Abnormal   Collection Time    03/25/14  1:48 PM      Result Value Ref Range   Opiates NONE DETECTED  NONE DETECTED   Cocaine POSITIVE (*) NONE DETECTED   Benzodiazepines NONE DETECTED  NONE DETECTED   Amphetamines NONE DETECTED  NONE DETECTED   Tetrahydrocannabinol NONE DETECTED  NONE DETECTED   Barbiturates NONE DETECTED  NONE DETECTED   Comment:            DRUG SCREEN FOR MEDICAL PURPOSES     ONLY.  IF CONFIRMATION IS NEEDED     FOR ANY PURPOSE, NOTIFY LAB     WITHIN 5 DAYS.                LOWEST DETECTABLE LIMITS     FOR URINE DRUG SCREEN     Drug Class       Cutoff (ng/mL)     Amphetamine      1000     Barbiturate      200     Benzodiazepine   948     Tricyclics       546     Opiates          300     Cocaine          300     THC              50  CBG MONITORING, ED     Status: Abnormal   Collection Time    03/25/14  5:25 PM      Result Value Ref Range   Glucose-Capillary 100 (*) 70 - 99 mg/dL   Labs are reviewed see above values.  Medications reviewed.  Librium started for alcohol detox  Current Facility-Administered Medications  Medication Dose Route Frequency Provider Last  Rate Last Dose  . acetaminophen (TYLENOL) tablet 650 mg  650 mg Oral Q6H PRN Hampton Abbot, MD   650 mg at 03/27/14 0752  . albuterol (PROVENTIL HFA;VENTOLIN HFA) 108 (90 BASE) MCG/ACT inhaler 2 puff  2 puff Inhalation Q6H PRN Hampton Abbot, MD      . alum & mag hydroxide-simeth (MAALOX/MYLANTA) 200-200-20 MG/5ML suspension 30 mL  30 mL Oral Q4H PRN Hampton Abbot, MD      . amLODipine (NORVASC) tablet 10 mg  10 mg Oral Daily Hampton Abbot, MD   10 mg at 03/27/14 0746  . B-complex with vitamin C tablet 1 tablet  1 tablet Oral Daily Hampton Abbot, MD   1 tablet at 03/27/14 0745  . chlordiazePOXIDE (LIBRIUM) capsule 25  mg  25 mg Oral Q6H PRN Hampton Abbot, MD      . cholecalciferol (VITAMIN D) tablet 5,000 Units  5,000 Units Oral Daily Hampton Abbot, MD   5,000 Units at 03/27/14 0746  . escitalopram (LEXAPRO) tablet 10 mg  10 mg Oral Daily Hampton Abbot, MD   10 mg at 03/27/14 0746  . folic acid (FOLVITE) tablet 1 mg  1 mg Oral Daily Hampton Abbot, MD   1 mg at 03/27/14 0746  . hydrOXYzine (ATARAX/VISTARIL) tablet 25 mg  25 mg Oral Q6H PRN Hampton Abbot, MD      . levETIRAcetam (KEPPRA XR) 24 hr tablet 500 mg  500 mg Oral Daily Hampton Abbot, MD   500 mg at 03/27/14 0746  . loperamide (IMODIUM) capsule 2-4 mg  2-4 mg Oral PRN Hampton Abbot, MD      . magnesium hydroxide (MILK OF MAGNESIA) suspension 30 mL  30 mL Oral Daily PRN Hampton Abbot, MD      . multivitamin with minerals tablet 1 tablet  1 tablet Oral Daily Hampton Abbot, MD   1 tablet at 03/27/14 0745  . ondansetron (ZOFRAN-ODT) disintegrating tablet 4 mg  4 mg Oral Q6H PRN Hampton Abbot, MD      . thiamine (VITAMIN B-1) tablet 100 mg  100 mg Oral Daily Hampton Abbot, MD   100 mg at 03/27/14 0746  . tiotropium (SPIRIVA) inhalation capsule 18 mcg  18 mcg Inhalation Daily Hampton Abbot, MD   18 mcg at 03/27/14 0747    Psychiatric Specialty Exam:     Blood pressure 106/54, pulse 62, temperature 98 F (36.7 C), temperature source Oral, resp.  rate 17, height $RemoveBe'5\' 8"'sEakTFwet$  (1.727 m), weight 81.647 kg (180 lb), SpO2 98.00%.Body mass index is 27.38 kg/(m^2).  General Appearance: Casual  Eye Contact::  Fair  Speech:  Clear and Coherent and Normal Rate  Volume:  Normal  Mood:  Depressed  Affect:  Congruent and Depressed  Thought Process:  Circumstantial and Goal Directed  Orientation:  Full (Time, Place, and Person)  Thought Content:  WDL  Suicidal Thoughts:  No  Homicidal Thoughts:  No  Memory:  Immediate;   Good Recent;   Good Remote;   Good  Judgement:  Fair  Insight:  Lacking  Psychomotor Activity:  Tremor  Concentration:  Fair  Recall:  Good  Fund of Knowledge:Good  Language: Good  Akathisia:  No  Handed:  Right  AIMS (if indicated):     Assets:  Communication Skills Desire for Improvement Housing Social Support Transportation  Sleep:      Musculoskeletal: Strength & Muscle Tone: within normal limits Gait & Station: normal Patient leans: N/A  Treatment Plan Summary: Discharge home with outpatient resources. To be assisted by Tom with [facility].   Benjamine Mola, FNP-BC 03/27/2014 3:18 PM

## 2014-03-27 NOTE — Discharge Instructions (Addendum)
For your substance abuse treatment needs, the [address] [facility] [place] in [place], [place] has agreed to accept you.  Plan to go there directly from the Observation Unit:       [facility] [place]      [address]      [place], [place] [address]      [phone]

## 2014-03-27 NOTE — Progress Notes (Signed)
Patient ID: Samuel Atkinson, male   DOB: Feb 27, 1966, 48 y.o.   MRN: [id number] D-Denies any specific sx of withdrawal. His only complaint is of generalized pain, non specific on what the cause is. He is pleasant, offers little and states he feels he needs to be admitted to the unit but doesn't say why. A-Support offered. Monitored for safety. Medications as ordered. R-Gave him Tylenol for pain along with am medications. Sleeping much of the am.Waiting on the Dr or extender to make a recommendation for his disposition.

## 2014-03-27 NOTE — Progress Notes (Signed)
Patient ID: Samuel Atkinson, male   DOB: 1965-12-25, 48 y.o.   MRN: [id number] Discharge Note-Tom H. Arranged for him to be admitted to a 28 day program at [facility] [place].He is willing to go and has arranged with his wife for her to transport him there. He has not required any prns for his detox and his CIWA was 1 this afternoon. He has taken in fluids and food without difficulty. He asked to shave and was allowed to do so with supervision. He denies current thoughts to hurt self and others. His personal belongings were returned to him. Wife here to take him to treatment center

## 2014-04-12 ENCOUNTER — Telehealth: Payer: Self-pay | Admitting: Neurology

## 2014-04-12 NOTE — Telephone Encounter (Signed)
Patient's wife calling on behalf of patient, states that patient wants sooner appointment since he will be coming out of rehab on 04/24/14. Please call and advise.

## 2014-04-12 NOTE — Telephone Encounter (Signed)
Patient last seen by Dr. Hosie PoissonSumner in Aug and per last office visit note patient is to follow up in Feb 2016 with [facility]. Please advise phone note.

## 2014-04-12 NOTE — Telephone Encounter (Signed)
I called patient, talked with the wife. The patient has been placed back in a drug rehabilitation program, he is now on new medications, and the patient is concerned about whether or not these medications may negatively impact his seizure control. He wishes to have a sooner revisit appointment. The patient will be out of the rehabilitation center on 04/25/2014, we will see him in early November 2015.

## 2014-04-13 ENCOUNTER — Telehealth: Payer: Self-pay | Admitting: *Deleted

## 2014-04-13 NOTE — Telephone Encounter (Signed)
Spoke with patient, appointment was scheduled for 05/01/14 at 8:30 am with Dr Anne HahnWillis

## 2014-04-13 NOTE — Telephone Encounter (Signed)
Spoke with patient's wife, appointment was scheduled for 05/01/14 at 8:30 am.

## 2014-05-01 ENCOUNTER — Ambulatory Visit (INDEPENDENT_AMBULATORY_CARE_PROVIDER_SITE_OTHER): Payer: Managed Care, Other (non HMO) | Admitting: Neurology

## 2014-05-01 ENCOUNTER — Encounter: Payer: Self-pay | Admitting: Neurology

## 2014-05-01 VITALS — BP 121/66 | HR 71 | Ht 73.0 in | Wt 176.0 lb

## 2014-05-01 DIAGNOSIS — R569 Unspecified convulsions: Secondary | ICD-10-CM

## 2014-05-01 MED ORDER — LEVETIRACETAM ER 500 MG PO TB24: 1500.0000 mg | ORAL_TABLET | Freq: Every day | ORAL | Status: DC

## 2014-05-01 MED ORDER — NALTREXONE HCL 50 MG PO TABS: 50.0000 mg | ORAL_TABLET | Freq: Every day | ORAL | Status: DC

## 2014-05-01 NOTE — Progress Notes (Signed)
Reason for visit: Seizures  Corky SingDavid Balke is an 48 y.o. male  History of present illness:  [name] is a 48 year old right-handed black male with a history of a cerebral AVM, status post embolization. [place]izations were done in [place], and he has had an [facility], and he is planning to have another study done in January 2016. 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 The patient had a seizure that occurred around 03/25/2014, associated with a period of sleep deprivation. The patient is on Keppra XR taking 1000 mg daily. The patient has not had any further seizures, he is currently not operating a motor vehicle. He is on naltrexone to help prevent a relapse of alcohol and cocaine abuse. The patient is now off of cocaine and alcohol. He returns this office for an evaluation.  Past Medical History  Diagnosis Date  . Hypertension   . Seizure disorder   . Hx of substance abuse   . AVM (arteriovenous malformation)     Past Surgical History  Procedure Laterality Date  . Avm embolization  2013    Family History  Problem Relation Age of Onset  . Hypertension Father   . Coronary artery disease Father   . Diabetes Mother   . Hypertension Mother   . Coronary artery disease Brother   . Healthy Brother   . Coronary artery disease Brother     Social history:  reports that he has been smoking.  He has never used smokeless tobacco. He reports that he drinks alcohol. He reports that he uses illicit drugs (Cocaine).    Allergies  Allergen Reactions  . Latex Rash    Medications:  Current Outpatient Prescriptions on File Prior to Visit  Medication Sig Dispense Refill  . albuterol (PROVENTIL HFA;VENTOLIN HFA) 108 (90 BASE) MCG/ACT inhaler Inhale 1-2 puffs into the lungs every 6 (six) hours as needed for wheezing or shortness of breath.    Marland Kitchen. amLODipine (NORVASC) 10 MG tablet Take 1 tablet (10 mg total) by mouth  daily.    . B Complex-Biotin-FA (SUPER B-50 COMPLEX) CAPS Take 1 capsule by mouth daily.    . Calcium-Magnesium (CALCIUM MAGNESIUM 750) 300-300 MG TABS Take 1 tablet by mouth daily.  0  . Cholecalciferol (VITAMIN D-3) 5000 UNITS TABS Take 5,000 Units by mouth daily. 30 tablet   . escitalopram (LEXAPRO) 10 MG tablet Take 1 tablet (10 mg total) by mouth daily.    Marland Kitchen. tiotropium (SPIRIVA) 18 MCG inhalation capsule Place 18 mcg into inhaler and inhale daily as needed (for shortness of breath).     No current facility-administered medications on file prior to visit.    ROS:  Out of a complete 14 system review of symptoms, the patient complains only of the following symptoms, and all other reviewed systems are negative.  Headache Agitation Depression, anxiety Snoring Skin rash  Blood pressure 121/66, pulse 71, height 6\' 1"  (1.854 m), weight 176 lb (79.833 kg).  Physical Exam  General: The patient is alert and cooperative at the time of the examination.  Skin: No significant peripheral edema is noted.   Neurologic Exam  Mental status: The patient is oriented x 3.  Cranial nerves: Facial symmetry is present. Speech is normal, no aphasia or dysarthria is noted. Extraocular movements are full. Visual fields are full.  Motor: The patient has good strength in all 4 extremities.  Sensory examination: Soft touch sensation is symmetric on the face,  arms, and legs.  Coordination: The patient has good finger-nose-finger and heel-to-shin bilaterally.  Gait and station: The patient has a normal gait. Tandem gait is normal. Romberg is negative. No drift is seen.  Reflexes: Deep tendon reflexes are symmetric.   CT head 03/25/14:  FINDINGS: There is no intra or extra-axial fluid collection or mass lesion. The basilar cisterns and ventricles have a normal appearance. There is no CT evidence for acute infarction or hemorrhage. There is significant artifact from coil in the right frontal lobe  region. Bone windows are otherwise unremarkable.  IMPRESSION: No evidence for acute abnormality.    Assessment/Plan:  1. Cerebral AVM, status post embolization  2. History seizures  3. History of polysubstance abuse, cocaine and alcohol  The patient is to go up on the Keppra taking 1500 mg at night of the Keppra XR. The patient was given a prescription for the naltrexone. He will follow up in 4 months. He is not operating motor vehicle for at least 6 months following the last seizure event. The patient indicates that he does note that when he has a seizure, he has a sensation of a humming in the ears just prior to the event. The last seizure event was associated with sleep deprivation.  Marlan Palau. Keith Willis MD 05/01/2014 11:14 AM  [facility] [address] [place], [place] [id number]  Phone [phone] Fax [phone]

## 2014-05-01 NOTE — Patient Instructions (Signed)

## 2014-07-26 ENCOUNTER — Emergency Department (HOSPITAL_COMMUNITY)
Admission: EM | Admit: 2014-07-26 | Discharge: 2014-07-26 | Disposition: A | Discharge status: Home / Self Care | Payer: 59 | Attending: Emergency Medicine | Admitting: Emergency Medicine

## 2014-07-26 ENCOUNTER — Encounter (HOSPITAL_COMMUNITY): Payer: Self-pay | Admitting: Emergency Medicine

## 2014-07-26 DIAGNOSIS — F1994 Other psychoactive substance use, unspecified with psychoactive substance-induced mood disorder: Secondary | ICD-10-CM | POA: Diagnosis not present

## 2014-07-26 DIAGNOSIS — F1910 Other psychoactive substance abuse, uncomplicated: Secondary | ICD-10-CM

## 2014-07-26 DIAGNOSIS — I10 Essential (primary) hypertension: Secondary | ICD-10-CM | POA: Insufficient documentation

## 2014-07-26 DIAGNOSIS — G40909 Epilepsy, unspecified, not intractable, without status epilepticus: Secondary | ICD-10-CM | POA: Insufficient documentation

## 2014-07-26 DIAGNOSIS — Z720 Tobacco use: Secondary | ICD-10-CM | POA: Insufficient documentation

## 2014-07-26 DIAGNOSIS — R45851 Suicidal ideations: Secondary | ICD-10-CM | POA: Diagnosis present

## 2014-07-26 DIAGNOSIS — F1410 Cocaine abuse, uncomplicated: Secondary | ICD-10-CM | POA: Insufficient documentation

## 2014-07-26 DIAGNOSIS — Z79899 Other long term (current) drug therapy: Secondary | ICD-10-CM | POA: Insufficient documentation

## 2014-07-26 DIAGNOSIS — F1010 Alcohol abuse, uncomplicated: Secondary | ICD-10-CM

## 2014-07-26 DIAGNOSIS — Z91040 Latex allergy status: Secondary | ICD-10-CM | POA: Insufficient documentation

## 2014-07-26 DIAGNOSIS — Z8774 Personal history of (corrected) congenital malformations of heart and circulatory system: Secondary | ICD-10-CM | POA: Diagnosis not present

## 2014-07-26 LAB — RAPID URINE DRUG SCREEN, HOSP PERFORMED
Amphetamines: NOT DETECTED
BENZODIAZEPINES: POSITIVE — AB
Barbiturates: NOT DETECTED
Cocaine: POSITIVE — AB
Opiates: NOT DETECTED
Tetrahydrocannabinol: NOT DETECTED

## 2014-07-26 LAB — CBC
HCT: 45.3 % (ref 39.0–52.0)
Hemoglobin: 16.3 g/dL (ref 13.0–17.0)
MCH: 33 pg (ref 26.0–34.0)
MCHC: 36 g/dL (ref 30.0–36.0)
MCV: 91.7 fL (ref 78.0–100.0)
Platelets: 207 10*3/uL (ref 150–400)
RBC: 4.94 MIL/uL (ref 4.22–5.81)
RDW: 13.1 % (ref 11.5–15.5)
WBC: 9.1 10*3/uL (ref 4.0–10.5)

## 2014-07-26 LAB — COMPREHENSIVE METABOLIC PANEL
ALBUMIN: 4.4 g/dL (ref 3.5–5.2)
ALT: 18 U/L (ref 0–53)
AST: 27 U/L (ref 0–37)
Alkaline Phosphatase: 71 U/L (ref 39–117)
Anion gap: 6 (ref 5–15)
BUN: 14 mg/dL (ref 6–23)
CO2: 29 mmol/L (ref 19–32)
CREATININE: 1.13 mg/dL (ref 0.50–1.35)
Calcium: 8.9 mg/dL (ref 8.4–10.5)
Chloride: 102 mmol/L (ref 96–112)
GFR calc non Af Amer: 75 mL/min — ABNORMAL LOW (ref >90)
GFR, EST AFRICAN AMERICAN: 87 mL/min — AB (ref >90)
Glucose, Bld: 101 mg/dL — ABNORMAL HIGH (ref 70–99)
POTASSIUM: 3.8 mmol/L (ref 3.5–5.1)
SODIUM: 137 mmol/L (ref 135–145)
Total Bilirubin: 1.3 mg/dL — ABNORMAL HIGH (ref 0.3–1.2)
Total Protein: 7.4 g/dL (ref 6.0–8.3)

## 2014-07-26 LAB — TROPONIN I: Troponin I: <0.03 ng/mL (ref <0.031)

## 2014-07-26 LAB — ETHANOL

## 2014-07-26 LAB — ACETAMINOPHEN LEVEL

## 2014-07-26 LAB — SALICYLATE LEVEL

## 2014-07-26 MED ORDER — LORAZEPAM 1 MG PO TABS: 0.0000 mg | ORAL_TABLET | Freq: Four times a day (QID) | ORAL | Status: DC

## 2014-07-26 MED ORDER — ASPIRIN 81 MG PO CHEW
324.0000 mg | CHEWABLE_TABLET | Freq: Once | ORAL | Status: AC
Start: 2014-07-26 — End: 2014-07-26
  Administered 2014-07-26: 324 mg via ORAL
  Filled 2014-07-26: qty 4

## 2014-07-26 MED ORDER — AMLODIPINE BESYLATE 5 MG PO TABS
10.0000 mg | ORAL_TABLET | Freq: Every day | ORAL | Status: DC
  Filled 2014-07-26: qty 2

## 2014-07-26 MED ORDER — NALTREXONE HCL 50 MG PO TABS
50.0000 mg | ORAL_TABLET | Freq: Every day | ORAL | Status: DC
  Administered 2014-07-26: 50 mg via ORAL
  Filled 2014-07-26: qty 1

## 2014-07-26 MED ORDER — IBUPROFEN 400 MG PO TABS: 600.0000 mg | ORAL_TABLET | Freq: Three times a day (TID) | ORAL | Status: DC | PRN

## 2014-07-26 MED ORDER — ESCITALOPRAM OXALATE 10 MG PO TABS
10.0000 mg | ORAL_TABLET | Freq: Every day | ORAL | Status: DC
  Administered 2014-07-26: 10 mg via ORAL
  Filled 2014-07-26: qty 1

## 2014-07-26 MED ORDER — ZOLPIDEM TARTRATE 5 MG PO TABS: 5.0000 mg | ORAL_TABLET | Freq: Every evening | ORAL | Status: DC | PRN

## 2014-07-26 MED ORDER — VITAMIN B-1 100 MG PO TABS
100.0000 mg | ORAL_TABLET | Freq: Every day | ORAL | Status: DC
  Administered 2014-07-26: 100 mg via ORAL
  Filled 2014-07-26: qty 1

## 2014-07-26 MED ORDER — LEVETIRACETAM ER 500 MG PO TB24
1500.0000 mg | ORAL_TABLET | Freq: Every day | ORAL | Status: DC
  Administered 2014-07-26: 1500 mg via ORAL
  Filled 2014-07-26: qty 3

## 2014-07-26 MED ORDER — THIAMINE HCL 100 MG/ML IJ SOLN: 100.0000 mg | Freq: Every day | INTRAMUSCULAR | Status: DC

## 2014-07-26 MED ORDER — ONDANSETRON HCL 4 MG PO TABS: 4.0000 mg | ORAL_TABLET | Freq: Three times a day (TID) | ORAL | Status: DC | PRN

## 2014-07-26 MED ORDER — LORAZEPAM 1 MG PO TABS
2.0000 mg | ORAL_TABLET | Freq: Once | ORAL | Status: AC
  Administered 2014-07-26: 2 mg via ORAL
  Filled 2014-07-26: qty 2

## 2014-07-26 MED ORDER — LORAZEPAM 1 MG PO TABS: 0.0000 mg | ORAL_TABLET | Freq: Two times a day (BID) | ORAL | Status: DC

## 2014-07-26 NOTE — ED Notes (Signed)
Pt wheeled to pod C, belongings secure and placed at pod c nursing station

## 2014-07-26 NOTE — BH Assessment (Addendum)
Tele Assessment Note   Samuel Atkinson is an 49 y.o. male. Pt arrived to [facility] ED reporting crack cocaine and alcohol abuse. Pt reports SI. Pt denies HI, delusions and hallucinations. Pt reports daily use of crack cocaine and alcohol. Pt would not disclose how much crack cocaine and alcohol he uses. Pt reported SI with plan but would not disclose plan. When asked about a SI plan Pt states "many." Pt admits to 1 previous SI attempt. Pt states that he overdosed in 2013. Pt states that he feels depressed but he would not disclose depressive symptoms or triggers to his depression or drug use. Pt denies inpatient treatment but Pt was admitted to the OBs unit in 2015. Pt denies outpatient treatment and medication management. Pt was irritable and angry during the assessment. Pt refused to answer many of the writers questions. Pt's RN Clydie Braun reports that the Pt has denied a urinalysis. Pt has been uncooperative with medical staff at [facility] ED.  Writer consulted with NP Renata Caprice. NP Conrad's disposition is pending results of urinalysis. RN Clydie Braun and EDP informed of the above referenced.   Axis I: Substance Induced Mood Disorder Axis II: Deferred Axis III:  Past Medical History  Diagnosis Date  . Hypertension   . Seizure disorder   . Hx of substance abuse   . AVM (arteriovenous malformation)    Axis IV: other psychosocial or environmental problems, problems related to social environment and problems with primary support group Axis V: 31-40 impairment in reality testing  Past Medical History:  Past Medical History  Diagnosis Date  . Hypertension   . Seizure disorder   . Hx of substance abuse   . AVM (arteriovenous malformation)     Past Surgical History  Procedure Laterality Date  . Avm embolization  2013    Family History:  Family History  Problem Relation Age of Onset  . Hypertension Father   . Coronary artery disease Father   . Diabetes Mother   . Hypertension Mother   . Coronary artery  disease Brother   . Healthy Brother   . Coronary artery disease Brother     Social History:  reports that he has been smoking.  He has never used smokeless tobacco. He reports that he drinks alcohol. He reports that he uses illicit drugs (Cocaine).  Additional Social History:  Alcohol / Drug Use Pain Medications: Pt denies Prescriptions: Pt denies Over the Counter: Pt denies History of alcohol / drug use?: Yes Longest period of sobriety (when/how long): Unknown Negative Consequences of Use: Financial, Legal, Personal relationships, Work / School Substance #1 Name of Substance 1: Cocaine 1 - Age of First Use: Unknown 1 - Amount (size/oz): Unknown 1 - Frequency: Daily 1 - Duration: ongoing 1 - Last Use / Amount: 07/25/14 Substance #2 Name of Substance 2: Alcohol 2 - Age of First Use: Unknown 2 - Amount (size/oz): Unknown 2 - Frequency: Daily 2 - Duration: ongoing 2 - Last Use / Amount: 07/25/14  CIWA: CIWA-Ar BP: 110/61 mmHg Pulse Rate: 91 Nausea and Vomiting: no nausea and no vomiting Tactile Disturbances: none Tremor: no tremor Auditory Disturbances: not present Paroxysmal Sweats: no sweat visible Visual Disturbances: not present Anxiety: no anxiety, at ease Headache, Fullness in Head: none present Agitation: normal activity Orientation and Clouding of Sensorium: oriented and can do serial additions CIWA-Ar Total: 0 COWS:    PATIENT STRENGTHS: (choose at least two) Capable of independent living Communication skills  Allergies:  Allergies  Allergen Reactions  . Latex  Rash    Home Medications:  (Not in a hospital admission)  OB/GYN Status:  No LMP for male patient.  General Assessment Data Location of Assessment: [facility] ED ACT Assessment: Yes Is this a Tele or Face-to-Face Assessment?: Tele Assessment Is this an Initial Assessment or a Re-assessment for this encounter?: Initial Assessment Living Arrangements: Spouse/significant other Can pt return to current  living arrangement?: Yes Admission Status: Voluntary Is patient capable of signing voluntary admission?: Yes Transfer from: Home Referral Source: Self/Family/Friend     [facility] Crisis Care Plan Living Arrangements: Spouse/significant other Name of Psychiatrist: None Name of Therapist: None  Education Status Is patient currently in school?: No Current Grade: NA Highest grade of school patient has completed: Unknown Name of school: NA Contact person: NA  Risk to self with the past 6 months Suicidal Ideation: Yes-Currently Present Suicidal Intent: Yes-Currently Present Is patient at risk for suicide?: Yes Suicidal Plan?: Yes-Currently Present Specify Current Suicidal Plan: Pt states "many" Access to Means: No What has been your use of drugs/alcohol within the last 12 months?: Pt reports using crack cocaine and alcohol daily. Previous Attempts/Gestures: Yes How many times?: 1 Other Self Harm Risks: None Triggers for Past Attempts: Unknown Intentional Self Injurious Behavior: None Family Suicide History: No Recent stressful life event(s): Other (Comment) (Pt would not report) Persecutory voices/beliefs?: No Depression: Yes Depression Symptoms:  (Pt would not report) Substance abuse history and/or treatment for substance abuse?: Yes Suicide prevention information given to non-admitted patients: Not applicable  Risk to Others within the past 6 months Homicidal Ideation: No Thoughts of Harm to Others: No Current Homicidal Intent: No Current Homicidal Plan: No Access to Homicidal Means: No Identified Victim: NA History of harm to others?: No Assessment of Violence: None Noted Violent Behavior Description: NA Does patient have access to weapons?: No Criminal Charges Pending?: No Does patient have a court date: No  Psychosis Hallucinations: None noted Delusions: None noted  Mental Status Report Appear/Hygiene: Unable to Assess Eye Contact: Poor Motor Activity: Freedom  of movement Speech: Logical/coherent, Soft Level of Consciousness: Combative, Drowsy Mood: Irritable, Angry Affect: Angry Anxiety Level: None Thought Processes: Coherent, Relevant Judgement: Unable to Assess Orientation: Person, Place Obsessive Compulsive Thoughts/Behaviors: None  Cognitive Functioning Concentration: Unable to Assess Memory: Recent Intact, Remote Intact IQ: Average Insight: Unable to Assess Impulse Control: Unable to Assess Appetite: Good Weight Loss: 0 Weight Gain: 0 Sleep: No Change Total Hours of Sleep: 8 Vegetative Symptoms: None  ADLScreening [facility] Assessment Services) Patient's cognitive ability adequate to safely complete daily activities?: Yes Patient able to express need for assistance with ADLs?: Yes Independently performs ADLs?: Yes (appropriate for developmental age)  Prior Inpatient Therapy Prior Inpatient Therapy: Yes Prior Therapy Dates: 2015 Prior Therapy Facilty/Provider(s): [facility] Reason for Treatment: Alcohol  Prior Outpatient Therapy Prior Outpatient Therapy: No Prior Therapy Dates: NA Prior Therapy Facilty/Provider(s): NA Reason for Treatment: NA  ADL Screening (condition at time of admission) Patient's cognitive ability adequate to safely complete daily activities?: Yes Is the patient deaf or have difficulty hearing?: No Does the patient have difficulty seeing, even when wearing glasses/contacts?: No Does the patient have difficulty concentrating, remembering, or making decisions?: No Patient able to express need for assistance with ADLs?: Yes Does the patient have difficulty dressing or bathing?: No Independently performs ADLs?: Yes (appropriate for developmental age) Does the patient have difficulty walking or climbing stairs?: No Weakness of Legs: None Weakness of Arms/Hands: None       Abuse/Neglect Assessment (Assessment to be  complete while patient is alone) Physical Abuse: Denies Verbal Abuse: Denies Sexual Abuse:  Denies Exploitation of patient/patient's resources: Denies Self-Neglect: Denies Values / Beliefs Cultural Requests During Hospitalization: None Spiritual Requests During Hospitalization: None   Advance Directives (For Healthcare) Does patient have an advance directive?: No Would patient like information on creating an advanced directive?: No - patient declined information    Additional Information 1:1 In Past 12 Months?: No CIRT Risk: No Elopement Risk: No Does patient have medical clearance?: Yes     Disposition:  Disposition Initial Assessment Completed for this Encounter: Yes Disposition of Patient: Other dispositions (Pending UA results) Other disposition(s): Other (Comment) (NP awaiting UA results)  Minola Guin D 07/26/2014 9:44 AM

## 2014-07-26 NOTE — Discharge Instructions (Signed)
Suicidal Feelings, How to Help Yourself °Everyone feels sad or unhappy at times, but depressing thoughts and feelings of hopelessness can lead to thoughts of suicide. It can seem as if life is too tough to handle. If you feel as though you have reached the point where suicide is the only answer, it is time to let someone know immediately.  °HOW TO COPE AND PREVENT SUICIDE °· Let family, friends, teachers, or counselors know. Get help. Try not to isolate yourself from those who care about you. Even though you may not feel sociable, talk with someone every day. It is best if it is face-to-face. Remember, they will want to help you. °· Eat a regularly spaced and well-balanced diet. °· Get plenty of rest. °· Avoid alcohol and drugs because they will only make you feel worse and may also lower your inhibitions. Remove them from the home. If you are thinking of taking an overdose of your prescribed medicines, give your medicines to someone who can give them to you one day at a time. If you are on antidepressants, let your caregiver know of your feelings so he or she can provide a safer medicine, if that is a concern. °· Remove weapons or poisons from your home. °· Try to stick to routines. Follow a schedule and remind yourself that you have to keep that schedule every day. °· Set some realistic goals and achieve them. Make a list and cross things off as you go. Accomplishments give a sense of worth. Wait until you are feeling better before doing things you find difficult or unpleasant to do. °· If you are able, try to start exercising. Even half-hour periods of exercise each day will make you feel better. Getting out in the sun or into nature helps you recover from depression faster. If you have a favorite place to walk, take advantage of that. °· Increase safe activities that have always given you pleasure. This may include playing your favorite music, reading a good book, painting a picture, or playing your favorite  instrument. Do whatever takes your mind off your depression. °· Keep your living space well-lighted. °GET HELP °Contact a suicide hotline, crisis center, or local suicide prevention center for help right away. Local centers may include a hospital, clinic, community service organization, social service provider, or health department. °· Call your local emergency services (911 in the [place]). °· Call a suicide hotline: °¨ 1-800-273-TALK (1-800-273-8255) in the [place]. °¨ 1-800-SUICIDE (1-800-784-2433) in the [place]. °¨ 1-888-628-9454 in the [place] for Spanish-speaking counselors. °¨ 1-800-799-4TTY (1-800-799-4889) in the [place] for TTY users. °· Visit the following websites for information and help: °¨ National Suicide Prevention Lifeline: www.suicidepreventionlifeline.org °¨ Hopeline: www.hopeline.com °¨ American Foundation for Suicide Prevention: www.afsp.org °· For lesbian, gay, bisexual, transgender, or questioning youth, contact The Trevor Project: °¨ 1-866-4-U-TREVOR (1-866-488-7386) in the [place]. °¨ www.thetrevorproject.org °· In [place], treatment resources are listed in each province with listings available under [employer] or similar titles. Another source for Crisis Centres by Province is located at http://www.suicideprevention.ca/in-crisis-now/find-a-crisis-centre-now/crisis-centres °Document Released: 12/21/2002 Document Revised: 09/08/2011 Document Reviewed: 10/11/2013 °ExitCare® Patient Information ©2015 [employer] LLC. This information is not intended to replace advice given to you by your health care provider. Make sure you discuss any questions you have with your health care provider. ° °

## 2014-07-26 NOTE — BHH Counselor (Signed)
Disposition pending. Awaiting UA results.  Samuel PhoenixBrandi Tavaris Eudy, [facility] Triage Specialist

## 2014-07-26 NOTE — ED Provider Notes (Signed)
Psychiatry have done an extensive review of the patient, including speaking to family members and reviewing prior records, and feel that the patient is stable to discharge home. The patient and family both contract for safety. Patient denies suicidal or homicidal ideations. Social work heavily involved. Patient discharged home in stable condition.  Samuel MoMatthew Gentry, MD 07/26/14 [phone]

## 2014-07-26 NOTE — ED Notes (Signed)
Pt.is getting undress into scrubbs

## 2014-07-26 NOTE — ED Notes (Signed)
TTS Monitor at bedside. 

## 2014-07-26 NOTE — ED Provider Notes (Signed)
CSN: [id number]     Arrival date & time 07/26/14  0531 History   First MD Initiated Contact with Patient 07/26/14 0545     Chief Complaint  Patient presents with  . Suicidal     (Consider location/radiation/quality/duration/timing/severity/associated sxs/prior Treatment) HPI Comments: Pt with hx of polysubstance abuse and depression comes in with SI. Pt states that he started feeling suicidal yday, for no specific reason. Pt has had OD in the plan, and he doesn't respond to my query on if he has a plan for suicide. He admits to cocaine and alcohol use, and some chest discomfort. Pt has no n/v/f/c.   The history is provided by the patient.    Past Medical History  Diagnosis Date  . Hypertension   . Seizure disorder   . Hx of substance abuse   . AVM (arteriovenous malformation)    Past Surgical History  Procedure Laterality Date  . Avm embolization  2013   Family History  Problem Relation Age of Onset  . Hypertension Father   . Coronary artery disease Father   . Diabetes Mother   . Hypertension Mother   . Coronary artery disease Brother   . Healthy Brother   . Coronary artery disease Brother    History  Substance Use Topics  . Smoking status: Current Every Day Smoker -- 1.00 packs/day  . Smokeless tobacco: Never Used  . Alcohol Use: Yes     Comment: Unsure of amount     Review of Systems  Constitutional: Negative for activity change and appetite change.  Respiratory: Negative for cough and shortness of breath.   Cardiovascular: Positive for chest pain.  Gastrointestinal: Negative for abdominal pain.  Genitourinary: Negative for dysuria.  Psychiatric/Behavioral: Positive for suicidal ideas.      Allergies  Latex  Home Medications   Prior to Admission medications   Medication Sig Start Date End Date Taking? Authorizing Provider  albuterol (PROVENTIL HFA;VENTOLIN HFA) 108 (90 BASE) MCG/ACT inhaler Inhale 1-2 puffs into the lungs every 6 (six) hours as needed  for wheezing or shortness of breath.    Historical Provider, MD  amLODipine (NORVASC) 10 MG tablet Take 1 tablet (10 mg total) by mouth daily. 03/27/14   Beau FannyJohn C Withrow, FNP  B Complex-Biotin-FA (SUPER B-50 COMPLEX) CAPS Take 1 capsule by mouth daily. 03/27/14   Beau FannyJohn C Withrow, FNP  Calcium-Magnesium (CALCIUM MAGNESIUM 750) 300-300 MG TABS Take 1 tablet by mouth daily. 03/27/14   Beau FannyJohn C Withrow, FNP  Cholecalciferol (VITAMIN D-3) 5000 UNITS TABS Take 5,000 Units by mouth daily. 03/27/14   Beau FannyJohn C Withrow, FNP  clarithromycin (BIAXIN) 500 MG tablet 500 mg daily. 04/17/14   Historical Provider, MD  escitalopram (LEXAPRO) 10 MG tablet Take 1 tablet (10 mg total) by mouth daily. 03/27/14   Beau FannyJohn C Withrow, FNP  levETIRAcetam (KEPPRA XR) 500 MG 24 hr tablet Take 3 tablets (1,500 mg total) by mouth daily. 05/01/14   York Spanielharles K Willis, MD  naltrexone (DEPADE) 50 MG tablet Take 1 tablet (50 mg total) by mouth daily. 05/01/14   York Spanielharles K Willis, MD  tiotropium (SPIRIVA) 18 MCG inhalation capsule Place 18 mcg into inhaler and inhale daily as needed (for shortness of breath).    Historical Provider, MD   BP 128/69 mmHg  Pulse 94  Temp(Src) 98.6 F (37 C)  Resp 16  Ht 6' (1.829 m)  Wt 170 lb (77.111 kg)  BMI 23.05 kg/m2  SpO2 95% Physical Exam  Constitutional: He is oriented to  person, place, and time. He appears well-developed.  HENT:  Head: Normocephalic and atraumatic.  Eyes: Conjunctivae and EOM are normal. Pupils are equal, round, and reactive to light.  Neck: Normal range of motion. Neck supple.  Cardiovascular: Normal rate and regular rhythm.   Pulmonary/Chest: Effort normal and breath sounds normal.  Abdominal: Soft. Bowel sounds are normal. He exhibits no distension. There is no tenderness. There is no rebound and no guarding.  Neurological: He is alert and oriented to person, place, and time.  Skin: Skin is warm.  Psychiatric:  Flat affect  Nursing note and vitals reviewed.   ED Course   Procedures (including critical care time) Labs Review Labs Reviewed  COMPREHENSIVE METABOLIC PANEL - Abnormal; Notable for the following:    Glucose, Bld 101 (*)    Total Bilirubin 1.3 (*)    GFR calc non Af Amer 75 (*)    GFR calc Af Amer 87 (*)    All other components within normal limits  ACETAMINOPHEN LEVEL - Abnormal; Notable for the following:    Acetaminophen (Tylenol), Serum <10.0 (*)    All other components within normal limits  CBC  ETHANOL  SALICYLATE LEVEL  URINE RAPID DRUG SCREEN (HOSP PERFORMED)  TROPONIN I    Imaging Review No results found.   EKG Interpretation None      MDM   Final diagnoses:  Polysubstance abuse  Suicidal ideation    Pt comes in with SI. He has no psych hx per our records. Pt has a flat affect, and is poor historian. He admits to cocaine use and alcohol use. He has some chest discomfort. Labs ordered. Will need psych assessment once medical workup returns and is neg.     Derwood Kaplan, MD 07/26/14 [phone]

## 2014-07-26 NOTE — ED Notes (Signed)
Spoke with Dr. Littie DeedsGentry regarding patients BP of 116/71.  Medication (Amlodipine) to be discontinued.  Advise MD if patient becomes hypertensive.

## 2014-07-26 NOTE — Progress Notes (Signed)
LCSW received call from [facility] that patient has been discharged with resources for outpatient SA.  LCSW facilitated and met with patient who was uncooperative and would not wake up to talk.  Patient had ativan this morning, but per sitter, patient just does not want to wake up.  Patient reported her lives at home with mother, but unclear if mother will pick patient up.  Patient to call.  Resources given for IOP, SA outpatient, and inpatient SA along with a bus pass in case patient cannot find transportation.  No other needs at this time.  RN given information.  Lane Hacker, MSW Clinical Social Work: Emergency Room [phone]

## 2014-07-26 NOTE — ED Notes (Signed)
TTS monitor placed at bedside 

## 2014-07-26 NOTE — ED Notes (Signed)
Pt states that he started feeling suicidal yesterday. When asked if he had a plan, pt states "Like last time". When pushed to specify, the pt states that he overdosed on pills in 2013. Pt reports ETOH and heroine use; last use was an hour and a half ago. Pt is unsure of amount.

## 2014-07-26 NOTE — ED Notes (Signed)
Staffing office notified for pt.'s sitter . 

## 2014-07-26 NOTE — ED Notes (Signed)
Sp[facility] with Brandi with [facility] regarding patient not providing Urine drug screen and results of current labs.  Patient angry during Telephsych and wanting to sleep.

## 2014-07-27 DIAGNOSIS — R45851 Suicidal ideations: Secondary | ICD-10-CM | POA: Insufficient documentation

## 2014-07-27 NOTE — Consult Note (Signed)
[facility] Telepsychiatry Consult   Reason for Consult: substance abuse, suicidal threats Referring Physician:  EDP  Samuel Atkinson is an 49 y.o. male. Total Time spent with patient: 25 minutes  Assessment: AXIS I:  Alcohol Abuse, Substance Abuse and Substance Induced Mood Disorder AXIS II:  Deferred AXIS III:   Past Medical History  Diagnosis Date  . Hypertension   . Seizure disorder   . Hx of substance abuse   . AVM (arteriovenous malformation)    AXIS IV:  other psychosocial or environmental problems AXIS V:  51-60 moderate symptoms  Plan:  No evidence of imminent risk to self or others at present.   Patient does not meet criteria for psychiatric inpatient admission. Supportive therapy provided about ongoing stressors. Refer to IOP. Discussed crisis plan, support from social network, calling 911, coming to the Emergency Department, and calling Suicide Hotline. Discharge home with wife, Samuel Atkinson    Subjective:   Pt seen and chart reviewed. Per nursing report, pt had initially refused a urine sample, demanding treatment instead. This NP called the EDP, Dr. Colin Atkinson and encouraged him to be firm with the patient as he has a history of acting aggressive to obtain certain treatment. Dr. Colin Atkinson reported that he was more firm with the patient and that he did cooperate at that point without aggressive behavior and provided the urine sample. Pt initially refused to speak with this NP until he was reminded that he has been seen by this NP recently at [facility]. Pt then began to converse with NP although somewhat hostile and stating "what's the point". Pt admitted that he told some staff that he "had many plans" about hurting himself. After discussion of patient's needs, pt reports that he really wants resources for substance abuse and that he is not suicidal or homicidal. Pt has a history of inconsistent statements and is known to this NP from prior encounters with inconsistent behavior congruent with today's  presentation. Pt did admit that he was not honest with staff earlier and that he had no intention of self-harm. Pt presented as angry initially, but presented as calm when this NP reminded him that we cannot help him at all if he refuses to speak to [place]. This leads [place] to believe that his emotional outbursts are manipulative rather than genuine. [facility] TTS staff are now working diligently to refer him to follow-up appointments for substance abuse. Pt reported "multiple 40oz daily" yet his BAL was negative, CIWA was 0, 0 and he presented without physical withdrawal symptoms. The Ativan/Librium protocol was not necessary per EDP.   This NP spoke to his wife, Samuel Atkinson regarding safety in the home if pt were to discharge. Samuel Atkinson stated "safety has never been an issue, that's not the issue, it's only substance abuse. We take him to his appointments but a lot of the time he will refuse. He just got out of a 28 day program that you all sent him to". Pt actually denied that he went to any followup after I evaluated him at [facility] recently, in direct contrast with his wife's report and in contrast with referral information on his medical chart. Pt's wife reports that he will often refuse to communicate and that he will often be very manipulative. She reports that there are no weapons or dangerous items in the house and that she will do her best to take care of him and keep an eye on him. She provided this NP with the phone number of his sister, Samuel Atkinson [phone]  who is reportedly a Education officer, museum who deals with substance about patients. Samuel Atkinson reported that pt has a history of manipulation, refusal to communicate with family and medical staff, and that she has no concerns for his safety either but that her primary concern for him is also substance abuse. While she does not live with him, she reports that she will do her best to keep him safe and take him to all of his appointments as needed. Additionally, Samuel Atkinson reports "I spoke  to him the day before, on Monday and he was perfectly fine, he was great, so something had to have happened at home in that 24 hours with his wife or something because everything else was fine before that". Pt arrived in the ED within 24 hours after his sister reported that he presented as doing well.   HPI: Samuel Atkinson is an 49 y.o. male. Pt arrived to [facility] ED reporting crack cocaine and alcohol abuse. Pt reports SI. Pt denies HI, delusions and hallucinations. Pt reports daily use of crack cocaine and alcohol. Pt would not disclose how much crack cocaine and alcohol he uses. Pt reported SI with plan but would not disclose plan. When asked about a SI plan Pt states "many." Pt admits to 1 previous SI attempt. Pt states that he overdosed in 2013. Pt states that he feels depressed but he would not disclose depressive symptoms or triggers to his depression or drug use. Pt denies inpatient treatment but Pt was admitted to the OBs unit in 2015. Pt denies outpatient treatment and medication management. Pt was irritable and angry during the assessment. Pt refused to answer many of the writers questions. Pt's RN Samuel Atkinson reports that the Pt has denied a urinalysis. Pt has been uncooperative with medical staff at [facility] ED.   Writer consulted with NP Samuel Atkinson. NP Samuel Atkinson's disposition is pending results of urinalysis. RN Samuel Atkinson and EDP informed of the above referenced.     HPI Elements:   Location:  Psychiatric. Quality:  Improving. Severity:  Moderate. Timing:  Chronic with exacerbation of sub-24 hours. Duration:  Transient/Acute event  Context:  Exacerbation of underlying substance abuse with cited stressors being family strain. ROS Family History  Problem Relation Age of Onset  . Hypertension Father   . Coronary artery disease Father   . Diabetes Mother   . Hypertension Mother   . Coronary artery disease Brother   . Healthy Brother   . Coronary artery disease Brother      Past Psychiatric History: Past  Medical History  Diagnosis Date  . Hypertension   . Seizure disorder   . Hx of substance abuse   . AVM (arteriovenous malformation)     reports that he has been smoking.  He has never used smokeless tobacco. He reports that he drinks alcohol. He reports that he uses illicit drugs (Cocaine). Family History  Problem Relation Age of Onset  . Hypertension Father   . Coronary artery disease Father   . Diabetes Mother   . Hypertension Mother   . Coronary artery disease Brother   . Healthy Brother   . Coronary artery disease Brother    Family History Substance Abuse: Yes, Describe: (Pt would not report) Family Supports: Yes, List: (pt reports "I hope so") Living Arrangements: Spouse/significant other Can pt return to current living arrangement?: Yes Abuse/Neglect [facility]) Physical Abuse: Denies Verbal Abuse: Denies Sexual Abuse: Denies Allergies:   Allergies  Allergen Reactions  . Latex Rash    ACT Assessment Complete:  Yes:    Educational Status    Risk to Self: Risk to self with the past 6 months Suicidal Ideation: Yes-Currently Present Suicidal Intent: Yes-Currently Present Is patient at risk for suicide?: Yes Suicidal Plan?: Yes-Currently Present Specify Current Suicidal Plan: Pt states "many" Access to Means: No What has been your use of drugs/alcohol within the last 12 months?: Pt reports using crack cocaine and alcohol daily. Previous Attempts/Gestures: Yes How many times?: 1 Other Self Harm Risks: None Triggers for Past Attempts: Unknown Intentional Self Injurious Behavior: None Family Suicide History: No Recent stressful life event(s): Other (Comment) (Pt would not report) Persecutory voices/beliefs?: No Depression: Yes Depression Symptoms:  (Pt would not report) Substance abuse history and/or treatment for substance abuse?: Yes Suicide prevention information given to non-admitted patients: Not applicable  Risk to Others: Risk to Others within the past 6  months Homicidal Ideation: No Thoughts of Harm to Others: No Current Homicidal Intent: No Current Homicidal Plan: No Access to Homicidal Means: No Identified Victim: NA History of harm to others?: No Assessment of Violence: None Noted Violent Behavior Description: NA Does patient have access to weapons?: No Criminal Charges Pending?: No Does patient have a court date: No  Abuse: Abuse/Neglect Assessment (Assessment to be complete while patient is alone) Physical Abuse: Denies Verbal Abuse: Denies Sexual Abuse: Denies Exploitation of patient/patient's resources: Denies Self-Neglect: Denies  Prior Inpatient Therapy: Prior Inpatient Therapy Prior Inpatient Therapy: Yes Prior Therapy Dates: 2015 Prior Therapy Facilty/Provider(s): [facility] Reason for Treatment: Alcohol  Prior Outpatient Therapy: Prior Outpatient Therapy Prior Outpatient Therapy: No Prior Therapy Dates: NA Prior Therapy Facilty/Provider(s): NA Reason for Treatment: NA  Additional Information: Additional Information 1:1 In Past 12 Months?: No CIRT Risk: No Elopement Risk: No Does patient have medical clearance?: Yes      Objective: Blood pressure 108/67, pulse 75, temperature 98.6 F (37 C), temperature source Oral, resp. rate 12, height 6' (1.829 m), weight 77.111 kg (170 lb), SpO2 97 %.Body mass index is 23.05 kg/(m^2). Results for orders placed or performed during the hospital encounter of 07/26/14 (from the past 72 hour(s))  CBC     Status: None   Collection Time: 07/26/14  6:03 AM  Result Value Ref Range   WBC 9.1 4.0 - 10.5 K/uL   RBC 4.94 4.22 - 5.81 MIL/uL   Hemoglobin 16.3 13.0 - 17.0 g/dL   HCT 45.3 39.0 - 52.0 %   MCV 91.7 78.0 - 100.0 fL   MCH 33.0 26.0 - 34.0 pg   MCHC 36.0 30.0 - 36.0 g/dL   RDW 13.1 11.5 - 15.5 %   Platelets 207 150 - 400 K/uL  Comprehensive metabolic panel     Status: Abnormal   Collection Time: 07/26/14  6:03 AM  Result Value Ref Range   Sodium 137 135 - 145 mmol/L    Potassium 3.8 3.5 - 5.1 mmol/L   Chloride 102 96 - 112 mmol/L   CO2 29 19 - 32 mmol/L   Glucose, Bld 101 (H) 70 - 99 mg/dL   BUN 14 6 - 23 mg/dL   Creatinine, Ser 1.13 0.50 - 1.35 mg/dL   Calcium 8.9 8.4 - 10.5 mg/dL   Total Protein 7.4 6.0 - 8.3 g/dL   Albumin 4.4 3.5 - 5.2 g/dL   AST 27 0 - 37 U/L   ALT 18 0 - 53 U/L   Alkaline Phosphatase 71 39 - 117 U/L   Total Bilirubin 1.3 (H) 0.3 - 1.2 mg/dL   GFR calc  non Af Amer 75 (L) >90 mL/min   GFR calc Af Amer 87 (L) >90 mL/min    Comment: (NOTE) The eGFR has been calculated using the CKD EPI equation. This calculation has not been validated in all clinical situations. eGFR's persistently <90 mL/min signify possible Chronic Atkinson Disease.    Anion gap 6 5 - 15  Ethanol (ETOH)     Status: None   Collection Time: 07/26/14  6:03 AM  Result Value Ref Range   Alcohol, Ethyl (B) <5 0 - 9 mg/dL    Comment:        LOWEST DETECTABLE LIMIT FOR SERUM ALCOHOL IS 11 mg/dL FOR MEDICAL PURPOSES ONLY   Acetaminophen level     Status: Abnormal   Collection Time: 07/26/14  6:03 AM  Result Value Ref Range   Acetaminophen (Tylenol), Serum <10.0 (L) 10 - 30 ug/mL    Comment:        THERAPEUTIC CONCENTRATIONS VARY SIGNIFICANTLY. A RANGE OF 10-30 ug/mL MAY BE AN EFFECTIVE CONCENTRATION FOR MANY PATIENTS. HOWEVER, SOME ARE BEST TREATED AT CONCENTRATIONS OUTSIDE THIS RANGE. ACETAMINOPHEN CONCENTRATIONS >150 ug/mL AT 4 HOURS AFTER INGESTION AND >50 ug/mL AT 12 HOURS AFTER INGESTION ARE OFTEN ASSOCIATED WITH TOXIC REACTIONS.   Salicylate level     Status: None   Collection Time: 07/26/14  6:03 AM  Result Value Ref Range   Salicylate Lvl <1.6 2.8 - 20.0 mg/dL  Troponin I     Status: None   Collection Time: 07/26/14  7:33 AM  Result Value Ref Range   Troponin I <0.03 <0.031 Atkinson/mL    Comment:        NO INDICATION OF MYOCARDIAL INJURY.   Urine rapid drug screen (hosp performed)     Status: Abnormal   Collection Time: 07/26/14 12:24 PM   Result Value Ref Range   Opiates NONE DETECTED NONE DETECTED   Cocaine POSITIVE (A) NONE DETECTED   Benzodiazepines POSITIVE (A) NONE DETECTED   Amphetamines NONE DETECTED NONE DETECTED   Tetrahydrocannabinol NONE DETECTED NONE DETECTED   Barbiturates NONE DETECTED NONE DETECTED    Comment:        DRUG SCREEN FOR MEDICAL PURPOSES ONLY.  IF CONFIRMATION IS NEEDED FOR ANY PURPOSE, NOTIFY LAB WITHIN 5 DAYS.        LOWEST DETECTABLE LIMITS FOR URINE DRUG SCREEN Drug Class       Cutoff (Atkinson/mL) Amphetamine      1000 Barbiturate      200 Benzodiazepine   109 Tricyclics       604 Opiates          300 Cocaine          300 THC              50    Labs are reviewed see above values.  UDS + for cocaine, benzo. Negative BAL.   No current facility-administered medications for this encounter.   Current Outpatient Prescriptions  Medication Sig Dispense Refill  . albuterol (PROVENTIL HFA;VENTOLIN HFA) 108 (90 BASE) MCG/ACT inhaler Inhale 1-2 puffs into the lungs every 6 (six) hours as needed for wheezing or shortness of breath.    Marland Kitchen amLODipine (NORVASC) 10 MG tablet Take 1 tablet (10 mg total) by mouth daily.    . B Complex-Biotin-FA (SUPER B-50 COMPLEX) CAPS Take 1 capsule by mouth daily.    . Calcium-Magnesium (CALCIUM MAGNESIUM 750) 300-300 MG TABS Take 1 tablet by mouth daily.  0  . Cholecalciferol (VITAMIN D-3) 5000 UNITS TABS  Take 5,000 Units by mouth daily. 30 tablet   . clarithromycin (BIAXIN) 500 MG tablet 500 mg daily.    Marland Kitchen escitalopram (LEXAPRO) 10 MG tablet Take 1 tablet (10 mg total) by mouth daily.    Marland Kitchen levETIRAcetam (KEPPRA XR) 500 MG 24 hr tablet Take 3 tablets (1,500 mg total) by mouth daily. 270 tablet 3  . naltrexone (DEPADE) 50 MG tablet Take 1 tablet (50 mg total) by mouth daily. 30 tablet 3  . tiotropium (SPIRIVA) 18 MCG inhalation capsule Place 18 mcg into inhaler and inhale daily as needed (for shortness of breath).      Psychiatric Specialty Exam:     Blood  pressure 108/67, pulse 75, temperature 98.6 F (37 C), temperature source Oral, resp. rate 12, height 6' (1.829 m), weight 77.111 kg (170 lb), SpO2 97 %.Body mass index is 23.05 kg/(m^2).  General Appearance: Casual  Eye Contact::  Fair  Speech:  Clear and Coherent and Normal Rate  Volume:  Normal  Mood:  Irritable  Affect:  Appropriate and Congruent  Thought Process:  Circumstantial and Goal Directed  Orientation:  Full (Time, Place, and Person)  Thought Content:  WDL  Suicidal Thoughts:  No pt reports that this was untrue, contracts  Homicidal Thoughts:  No  Memory:  Immediate;   Good Recent;   Good Remote;   Good  Judgement:  Fair  Insight:  Good  Psychomotor Activity:  Normal  Concentration:  Good  Recall:  Good  Fund of Knowledge:Good  Language: Good  Akathisia:  No  Handed:  Right  AIMS (if indicated):     Assets:  Communication Skills Desire for Improvement Housing Social Support Transportation  Sleep:      Musculoskeletal: Strength & Muscle Tone: within normal limits Gait & Station: normal Patient leans: N/A  Treatment Plan Summary: -Discharge home with wife, Samuel Atkinson (she is OK with this per phone call) -Refer to outpatient substance abuse resources and provide information for inpatient that pt can follow-up with -Rescind IVC if present -Safety plan established with wife, via this NP  Benjamine Mola, FNP-BC 07/26/2014 02:14PM

## 2014-07-28 ENCOUNTER — Emergency Department (HOSPITAL_COMMUNITY)
Admission: EM | Admit: 2014-07-28 | Discharge: 2014-07-29 | Disposition: A | Discharge status: Other Acute Inpatient Hospital | Payer: 59 | Attending: Emergency Medicine | Admitting: Emergency Medicine

## 2014-07-28 ENCOUNTER — Emergency Department (HOSPITAL_COMMUNITY): Payer: 59

## 2014-07-28 ENCOUNTER — Encounter (HOSPITAL_COMMUNITY): Payer: Self-pay | Admitting: Emergency Medicine

## 2014-07-28 DIAGNOSIS — Z79899 Other long term (current) drug therapy: Secondary | ICD-10-CM | POA: Insufficient documentation

## 2014-07-28 DIAGNOSIS — Z720 Tobacco use: Secondary | ICD-10-CM | POA: Diagnosis not present

## 2014-07-28 DIAGNOSIS — Z791 Long term (current) use of non-steroidal anti-inflammatories (NSAID): Secondary | ICD-10-CM | POA: Insufficient documentation

## 2014-07-28 DIAGNOSIS — F1410 Cocaine abuse, uncomplicated: Secondary | ICD-10-CM | POA: Diagnosis not present

## 2014-07-28 DIAGNOSIS — Z792 Long term (current) use of antibiotics: Secondary | ICD-10-CM | POA: Diagnosis not present

## 2014-07-28 DIAGNOSIS — R51 Headache: Secondary | ICD-10-CM | POA: Insufficient documentation

## 2014-07-28 DIAGNOSIS — G40909 Epilepsy, unspecified, not intractable, without status epilepticus: Secondary | ICD-10-CM | POA: Insufficient documentation

## 2014-07-28 DIAGNOSIS — Z91040 Latex allergy status: Secondary | ICD-10-CM | POA: Diagnosis not present

## 2014-07-28 DIAGNOSIS — R519 Headache, unspecified: Secondary | ICD-10-CM

## 2014-07-28 DIAGNOSIS — I10 Essential (primary) hypertension: Secondary | ICD-10-CM | POA: Insufficient documentation

## 2014-07-28 DIAGNOSIS — Z8774 Personal history of (corrected) congenital malformations of heart and circulatory system: Secondary | ICD-10-CM | POA: Insufficient documentation

## 2014-07-28 DIAGNOSIS — F1910 Other psychoactive substance abuse, uncomplicated: Secondary | ICD-10-CM

## 2014-07-28 DIAGNOSIS — F1010 Alcohol abuse, uncomplicated: Secondary | ICD-10-CM | POA: Insufficient documentation

## 2014-07-28 LAB — URINALYSIS, ROUTINE W REFLEX MICROSCOPIC
BILIRUBIN URINE: NEGATIVE
Glucose, UA: NEGATIVE mg/dL
HGB URINE DIPSTICK: NEGATIVE
Ketones, ur: NEGATIVE mg/dL
LEUKOCYTES UA: NEGATIVE
Nitrite: NEGATIVE
Protein, ur: NEGATIVE mg/dL
Specific Gravity, Urine: 1.004 — ABNORMAL LOW (ref 1.005–1.030)
Urobilinogen, UA: 0.2 mg/dL (ref 0.0–1.0)
pH: 5.5 (ref 5.0–8.0)

## 2014-07-28 LAB — CBC WITH DIFFERENTIAL/PLATELET
BASOS PCT: 0 % (ref 0–1)
Basophils Absolute: 0 10*3/uL (ref 0.0–0.1)
Eosinophils Absolute: 0 10*3/uL (ref 0.0–0.7)
Eosinophils Relative: 0 % (ref 0–5)
HEMATOCRIT: 43.7 % (ref 39.0–52.0)
Hemoglobin: 15.2 g/dL (ref 13.0–17.0)
Lymphocytes Relative: 25 % (ref 12–46)
Lymphs Abs: 2.1 10*3/uL (ref 0.7–4.0)
MCH: 32.8 pg (ref 26.0–34.0)
MCHC: 34.8 g/dL (ref 30.0–36.0)
MCV: 94.2 fL (ref 78.0–100.0)
MONO ABS: 0.9 10*3/uL (ref 0.1–1.0)
Monocytes Relative: 10 % (ref 3–12)
Neutro Abs: 5.6 10*3/uL (ref 1.7–7.7)
Neutrophils Relative %: 65 % (ref 43–77)
Platelets: 192 10*3/uL (ref 150–400)
RBC: 4.64 MIL/uL (ref 4.22–5.81)
RDW: 12.9 % (ref 11.5–15.5)
WBC: 8.6 10*3/uL (ref 4.0–10.5)

## 2014-07-28 LAB — COMPREHENSIVE METABOLIC PANEL
ALBUMIN: 4.2 g/dL (ref 3.5–5.2)
ALK PHOS: 67 U/L (ref 39–117)
ALT: 21 U/L (ref 0–53)
AST: 38 U/L — ABNORMAL HIGH (ref 0–37)
Anion gap: 13 (ref 5–15)
BUN: 14 mg/dL (ref 6–23)
CO2: 22 mmol/L (ref 19–32)
Calcium: 8.9 mg/dL (ref 8.4–10.5)
Chloride: 106 mmol/L (ref 96–112)
Creatinine, Ser: 0.89 mg/dL (ref 0.50–1.35)
GFR calc Af Amer: >90 mL/min (ref >90)
GLUCOSE: 127 mg/dL — AB (ref 70–99)
POTASSIUM: 3.5 mmol/L (ref 3.5–5.1)
Sodium: 141 mmol/L (ref 135–145)
TOTAL PROTEIN: 7.3 g/dL (ref 6.0–8.3)
Total Bilirubin: 0.9 mg/dL (ref 0.3–1.2)

## 2014-07-28 LAB — RAPID URINE DRUG SCREEN, HOSP PERFORMED
AMPHETAMINES: NOT DETECTED
BENZODIAZEPINES: NOT DETECTED
Barbiturates: NOT DETECTED
COCAINE: POSITIVE — AB
OPIATES: NOT DETECTED
Tetrahydrocannabinol: NOT DETECTED

## 2014-07-28 LAB — SALICYLATE LEVEL: Salicylate Lvl: <4 mg/dL (ref 2.8–20.0)

## 2014-07-28 LAB — ETHANOL: Alcohol, Ethyl (B): 105 mg/dL — ABNORMAL HIGH (ref 0–9)

## 2014-07-28 LAB — ACETAMINOPHEN LEVEL

## 2014-07-28 MED ORDER — IBUPROFEN 200 MG PO TABS: 600.0000 mg | ORAL_TABLET | Freq: Three times a day (TID) | ORAL | Status: DC | PRN

## 2014-07-28 MED ORDER — NALOXONE HCL 0.4 MG/ML IJ SOLN
INTRAMUSCULAR | Status: AC
  Administered 2014-07-28: 0.4 mg
  Filled 2014-07-28: qty 1

## 2014-07-28 MED ORDER — ESCITALOPRAM OXALATE 10 MG PO TABS
10.0000 mg | ORAL_TABLET | Freq: Every day | ORAL | Status: DC
  Administered 2014-07-28: 10 mg via ORAL
  Filled 2014-07-28: qty 1

## 2014-07-28 MED ORDER — AMLODIPINE BESYLATE 10 MG PO TABS
10.0000 mg | ORAL_TABLET | Freq: Every day | ORAL | Status: DC
  Administered 2014-07-28: 10 mg via ORAL
  Filled 2014-07-28 (×3): qty 1

## 2014-07-28 MED ORDER — ACETAMINOPHEN 325 MG PO TABS
650.0000 mg | ORAL_TABLET | ORAL | Status: DC | PRN
  Administered 2014-07-28: 650 mg via ORAL
  Filled 2014-07-28: qty 2

## 2014-07-28 MED ORDER — CALCIUM CARBONATE 1250 (500 CA) MG PO TABS
ORAL_TABLET | Freq: Every day | ORAL | Status: DC
  Filled 2014-07-28: qty 1

## 2014-07-28 MED ORDER — TIOTROPIUM BROMIDE MONOHYDRATE 18 MCG IN CAPS
18.0000 ug | ORAL_CAPSULE | Freq: Every day | RESPIRATORY_TRACT | Status: DC | PRN
  Filled 2014-07-28: qty 5

## 2014-07-28 MED ORDER — B COMPLEX-C PO TABS
1.0000 | ORAL_TABLET | Freq: Every day | ORAL | Status: DC
  Administered 2014-07-28: 1 via ORAL
  Filled 2014-07-28 (×3): qty 1

## 2014-07-28 MED ORDER — SODIUM CHLORIDE 0.9 % IV SOLN
INTRAVENOUS | Status: DC
  Administered 2014-07-28: 14:00:00 via INTRAVENOUS

## 2014-07-28 MED ORDER — ALBUTEROL SULFATE HFA 108 (90 BASE) MCG/ACT IN AERS: 1.0000 | INHALATION_SPRAY | Freq: Four times a day (QID) | RESPIRATORY_TRACT | Status: DC | PRN

## 2014-07-28 MED ORDER — LEVETIRACETAM ER 500 MG PO TB24
1500.0000 mg | ORAL_TABLET | Freq: Every day | ORAL | Status: DC
  Administered 2014-07-28: 1500 mg via ORAL
  Filled 2014-07-28 (×3): qty 3

## 2014-07-28 NOTE — ED Notes (Signed)
Patient transported to CT 

## 2014-07-28 NOTE — ED Notes (Signed)
Patient to be moved to bed 43

## 2014-07-28 NOTE — ED Notes (Signed)
TTS at bedside for eval 

## 2014-07-28 NOTE — ED Notes (Signed)
[facility] Expected date:  Expected time:  Means of arrival:  Comments: Hold for RESB Corky Singavid Blinn

## 2014-07-28 NOTE — BH Assessment (Signed)
Support paperwork has been completed.

## 2014-07-28 NOTE — ED Notes (Signed)
[phone] Vikki PortsValerie (Patient's wife)

## 2014-07-28 NOTE — ED Provider Notes (Signed)
CSN: [id number]     Arrival date & time 07/28/14  1337 History   First MD Initiated Contact with Patient 07/28/14 1358     Chief Complaint  Patient presents with  . Drug Overdose    Level V caveat due to altered mental status (Consider location/radiation/quality/duration/timing/severity/associated sxs/prior Treatment) Patient is a 49 y.o. male presenting with Overdose. The history is provided by the patient.  Drug Overdose   patient presents with altered mental status. Reportedly had been drinking alcohol and smoking crack. Became somewhat less responsive upon arriving to the ER. Brought back emergently to the resource room. Patient is somewhat somnolent. He does arouse to a tongue depressor in his posterior pharynx. He will awake somewhat but is not very good with answer questions.  Past Medical History  Diagnosis Date  . Hypertension   . Seizure disorder   . Hx of substance abuse   . AVM (arteriovenous malformation)    Past Surgical History  Procedure Laterality Date  . Avm embolization  2013   Family History  Problem Relation Age of Onset  . Hypertension Father   . Coronary artery disease Father   . Diabetes Mother   . Hypertension Mother   . Coronary artery disease Brother   . Healthy Brother   . Coronary artery disease Brother    History  Substance Use Topics  . Smoking status: Current Every Day Smoker -- 1.00 packs/day  . Smokeless tobacco: Never Used  . Alcohol Use: Yes     Comment: Unsure of amount     Review of Systems  Unable to perform ROS     Allergies  Latex  Home Medications   Prior to Admission medications   Medication Sig Start Date End Date Taking? Authorizing Provider  albuterol (PROVENTIL HFA;VENTOLIN HFA) 108 (90 BASE) MCG/ACT inhaler Inhale 1-2 puffs into the lungs every 6 (six) hours as needed for wheezing or shortness of breath.    Historical Provider, MD  amLODipine (NORVASC) 10 MG tablet Take 1 tablet (10 mg total) by mouth daily.  03/27/14   Beau FannyJohn C Withrow, FNP  B Complex-Biotin-FA (SUPER B-50 COMPLEX) CAPS Take 1 capsule by mouth daily. 03/27/14   Beau FannyJohn C Withrow, FNP  Calcium-Magnesium (CALCIUM MAGNESIUM 750) 300-300 MG TABS Take 1 tablet by mouth daily. 03/27/14   Beau FannyJohn C Withrow, FNP  Cholecalciferol (VITAMIN D-3) 5000 UNITS TABS Take 5,000 Units by mouth daily. 03/27/14   Beau FannyJohn C Withrow, FNP  clarithromycin (BIAXIN) 500 MG tablet 500 mg daily. 04/17/14   Historical Provider, MD  escitalopram (LEXAPRO) 10 MG tablet Take 1 tablet (10 mg total) by mouth daily. 03/27/14   Beau FannyJohn C Withrow, FNP  levETIRAcetam (KEPPRA XR) 500 MG 24 hr tablet Take 3 tablets (1,500 mg total) by mouth daily. 05/01/14   York Spanielharles K Willis, MD  naltrexone (DEPADE) 50 MG tablet Take 1 tablet (50 mg total) by mouth daily. 05/01/14   York Spanielharles K Willis, MD  tiotropium (SPIRIVA) 18 MCG inhalation capsule Place 18 mcg into inhaler and inhale daily as needed (for shortness of breath).    Historical Provider, MD   BP 127/73 mmHg  Pulse 84  Temp(Src) 97.9 F (36.6 C) (Oral)  Resp 16  Ht 6' (1.829 m)  Wt 170 lb (77.111 kg)  BMI 23.05 kg/m2  SpO2 97% Physical Exam  Constitutional: He appears well-developed.  HENT:  Head: Atraumatic.  Eyes:  Pupils constricted but reactive  Neck: Neck supple.  Cardiovascular: Normal rate and regular rhythm.   Pulmonary/Chest:  Effort normal.  Abdominal: Soft.  Musculoskeletal: Normal range of motion.  Neurological: He has normal reflexes.  Somnolent. Awake somewhat just stimulation posterior pharynx.  Skin: Skin is warm.    ED Course  Procedures (including critical care time) Labs Review Labs Reviewed  CBC WITH DIFFERENTIAL/PLATELET  COMPREHENSIVE METABOLIC PANEL  ETHANOL  URINALYSIS, ROUTINE W REFLEX MICROSCOPIC  URINE RAPID DRUG SCREEN (HOSP PERFORMED)  ACETAMINOPHEN LEVEL  SALICYLATE LEVEL    Imaging Review No results found.   EKG Interpretation   Date/Time:  Friday July 28 2014 14:05:12  EST Ventricular Rate:  92 PR Interval:  125 QRS Duration: 81 QT Interval:  359 QTC Calculation: 444 R Axis:   71 Text Interpretation:  Sinus rhythm Left atrial enlargement Borderline T  wave abnormalities Baseline wander in lead(s) I No significant change  since last tracing Confirmed by Rubin Payor  MD, Harrold Donath [phone]) on 07/28/2014  3:06:47 PM      MDM   Final diagnoses:  None    Patient presents with altered mental status. Likely took too much of his cocaine and alcohol. No improvement with Narcan. Unable to tell me why he did. Will await lab work and monitor.    Juliet Rude. Rubin Payor, MD 07/28/14 [id number]

## 2014-07-28 NOTE — ED Notes (Signed)
Pt states that he just used $300 cocaine and lots of ETOH about 30 mins before coming in here. Pt states that he always has thoughts of wanting to hurt himself, but when asked if this was intentional OD pt shrugs his shoulders as in he doesn't know.  Pt having hard time keeping eyes open and focus while being triaged.

## 2014-07-28 NOTE — ED Provider Notes (Signed)
Pt here following a drug overdose and drug binge.  Labs pending.  Will need to be monitored for a few more hours until he is more alert and can have a psychiatric assessment to ensure no SI.  1844  Pt is alert and answering questions.  He complains of headache.  CT scan is negative.  Pt admits he was trying to get high but also wanted to hurt himself.    Medically clear at this time.    Will ask TTS to evaluate.  Linwood DibblesJon Darnel Mchan, MD 07/28/14 [phone]

## 2014-07-28 NOTE — BH Assessment (Addendum)
Assessment completed. Consulted Janann Augustori Burkett, NP who recommended inpatient treatment. [facility] Bed [facility] to the services of Dr. Dub MikesLugo. Dr. Lynelle DoctorKnapp has been informed of the disposition.

## 2014-07-28 NOTE — ED Notes (Signed)
Patient and belongings wanded by security.  

## 2014-07-28 NOTE — BH Assessment (Signed)
Tele Assessment Note   Samuel Atkinson is an 49 y.o. male [facility]. Pt stated "I did drugs and alcohol to hurt myself". Pt did not provide any additional details only stating "life sucks". Pt reported that he has attempted suicide in the past; however is currently not receiving any mental health services. Pt reported that he recently lost his job approximately 1 week ago. Pt  did not report any HI or AVH at this time. Pt denied having access to weapons or firearms. Pt did not report any pending criminal charges or upcoming court dates. PT reported that [facility] Pt stated "I was there 4 months ago and started back using drugs 2 days ago". Pt did not report any physical, sexual or emotional abuse at this time. Inpatient treatment has been recommended. Pt is willing to sign in voluntarily.    Axis I: Major Depression, Recurrent severe and Alcohol intoxication, with moderate or severe use disorder; Cocaine Use Disorder, Moderate  Past Medical History:  Past Medical History  Diagnosis Date  . Hypertension   . Seizure disorder   . Hx of substance abuse   . AVM (arteriovenous malformation)     Past Surgical History  Procedure Laterality Date  . Avm embolization  2013    Family History:  Family History  Problem Relation Age of Onset  . Hypertension Father   . Coronary artery disease Father   . Diabetes Mother   . Hypertension Mother   . Coronary artery disease Brother   . Healthy Brother   . Coronary artery disease Brother     Social History:  reports that he has been smoking.  He has never used smokeless tobacco. He reports that he drinks alcohol. He reports that he uses illicit drugs (Cocaine).  Additional Social History:  Alcohol / Drug Use Pain Medications: Pt denies Prescriptions: Pt denies Over the Counter: Pt denies History of alcohol / drug use?: Yes Longest period of sobriety (when/how long): 2  years  Negative Consequences of Use: Financial, Personal relationships, Work / School Substance #1 Name of Substance 1: Cocaine 1 - Age of First Use: Unknown 1 - Amount (size/oz): Unknown 1 - Frequency: Daily 1 - Duration: ongoing 1 - Last Use / Amount: 07-28-14 "350 worth" Substance #2 Name of Substance 2: Alcohol 2 - Age of First Use: Unknown 2 - Amount (size/oz): Unknown 2 - Frequency: Daily 2 - Duration: ongoing 2 - Last Use / Amount: 07-28-14 "200 worth"   CIWA: CIWA-Ar BP: (!) 106/50 mmHg Pulse Rate: 87 COWS:    PATIENT STRENGTHS: (choose at least two) Average or above average intelligence Capable of independent living  Allergies:  Allergies  Allergen Reactions  . Latex Rash    Home Medications:  (Not in a hospital admission)  OB/GYN Status:  No LMP for male patient.  General Assessment Data Location of Assessment: [facility] ED Is this a Tele or Face-to-Face Assessment?: Face-to-Face Is this an Initial Assessment or a Re-assessment for this encounter?: Initial Assessment Living Arrangements: Spouse/significant other Can pt return to current living arrangement?: Yes Admission Status: Voluntary Is patient capable of signing voluntary admission?: Yes Transfer from: Home Referral Source: Psychiatrist     [facility] Living Arrangements: Spouse/significant other Name of Psychiatrist: No provider reported at this time.  Name of Therapist: No provider reported at this time.   Education Status Is patient currently in school?: No Current Grade: NA Highest grade  of school patient has completed: NA Name of school: NA Contact person: NA  Risk to self with the past 6 months Suicidal Ideation: Yes-Currently Present Suicidal Intent: Yes-Currently Present Is patient at risk for suicide?: Yes Suicidal Plan?: Yes-Currently Present Specify Current Suicidal Plan: "Drinking and drugs" Access to Means: Yes Specify Access to Suicidal Means: Pt had access to alcohol  and drugs.  What has been your use of drugs/alcohol within the last 12 months?: Pt reported that he has been using drugs and drinking alcohol for the past 2 days.  Previous Attempts/Gestures: Yes How many times?: 1 Other Self Harm Risks: No other self harm risk identified at this time.  Triggers for Past Attempts: Unknown Intentional Self Injurious Behavior: None Family Suicide History: No Recent stressful life event(s): Job Loss Persecutory voices/beliefs?: No Depression: Yes Substance abuse history and/or treatment for substance abuse?: Yes Suicide prevention information given to non-admitted patients: Not applicable  Risk to Others within the past 6 months Homicidal Ideation: No Thoughts of Harm to Others: No Current Homicidal Intent: No Current Homicidal Plan: No Access to Homicidal Means: No Identified Victim: NA History of harm to others?: No Assessment of Violence: On admission Violent Behavior Description: No violent behaviors observed at this time. Pt is calm and cooperative at this time.  Does patient have access to weapons?: No Criminal Charges Pending?: No Does patient have a court date: No  Psychosis Hallucinations: None noted Delusions: None noted  Mental Status Report Appear/Hygiene: In hospital gown Eye Contact: Poor Motor Activity: Unable to assess Speech: Logical/coherent, Soft Level of Consciousness: Drowsy Mood: Depressed Affect: Appropriate to circumstance Anxiety Level: None Thought Processes: Coherent, Relevant Judgement: Partial Orientation: Person, Place, Time, Situation Obsessive Compulsive Thoughts/Behaviors: None  Cognitive Functioning Concentration: Normal Memory: Recent Intact IQ: Average Insight: Poor Impulse Control: Poor Appetite: Good Weight Loss: 0 Weight Gain: 0 Sleep: Unable to Assess Vegetative Symptoms: Unable to Assess  ADLScreening [facility] Assessment Services) Patient's cognitive ability adequate to safely complete daily  activities?: Yes Patient able to express need for assistance with ADLs?: Yes Independently performs ADLs?: Yes (appropriate for developmental age)  Prior Inpatient Therapy Prior Inpatient Therapy: Yes Prior Therapy Dates: 2015 Prior Therapy Facilty/Provider(s): [facility]; [facility] Reason for Treatment: Alcohol  Prior Outpatient Therapy Prior Outpatient Therapy: No Prior Therapy Dates: NA Prior Therapy Facilty/Provider(s): NA Reason for Treatment: NA  ADL Screening (condition at time of admission) Patient's cognitive ability adequate to safely complete daily activities?: Yes Is the patient deaf or have difficulty hearing?: No Does the patient have difficulty seeing, even when wearing glasses/contacts?: No Does the patient have difficulty concentrating, remembering, or making decisions?: No Patient able to express need for assistance with ADLs?: Yes Does the patient have difficulty dressing or bathing?: No Independently performs ADLs?: Yes (appropriate for developmental age)       Abuse/Neglect Assessment (Assessment to be complete while patient is alone) Physical Abuse: Denies Verbal Abuse: Denies Sexual Abuse: Denies Exploitation of patient/patient's resources: Denies Self-Neglect: Denies     [employer] officer (For Healthcare) Does patient have an advance directive?: No Would patient like information on creating an advanced directive?: No - patient declined information    Additional Information 1:1 In Past 12 Months?: No CIRT Risk: No Elopement Risk: No Does patient have medical clearance?: Yes     Disposition:  Disposition Initial Assessment Completed for this Encounter: Yes  Samuel Atkinson S 07/28/2014 8:12 PM

## 2014-07-29 ENCOUNTER — Inpatient Hospital Stay (HOSPITAL_COMMUNITY)
Admission: AD | Admit: 2014-07-29 | Discharge: 2014-08-03 | DRG: 885 | Disposition: A | Discharge status: Home / Self Care | Payer: PRIVATE HEALTH INSURANCE | Source: Intra-hospital | Attending: Psychiatry | Admitting: Psychiatry

## 2014-07-29 ENCOUNTER — Encounter (HOSPITAL_COMMUNITY): Payer: Self-pay

## 2014-07-29 DIAGNOSIS — F1910 Other psychoactive substance abuse, uncomplicated: Secondary | ICD-10-CM | POA: Diagnosis not present

## 2014-07-29 DIAGNOSIS — I10 Essential (primary) hypertension: Secondary | ICD-10-CM | POA: Diagnosis present

## 2014-07-29 DIAGNOSIS — K5900 Constipation, unspecified: Secondary | ICD-10-CM | POA: Diagnosis present

## 2014-07-29 DIAGNOSIS — F331 Major depressive disorder, recurrent, moderate: Secondary | ICD-10-CM | POA: Diagnosis present

## 2014-07-29 DIAGNOSIS — F17210 Nicotine dependence, cigarettes, uncomplicated: Secondary | ICD-10-CM | POA: Diagnosis present

## 2014-07-29 DIAGNOSIS — F1010 Alcohol abuse, uncomplicated: Secondary | ICD-10-CM | POA: Diagnosis present

## 2014-07-29 DIAGNOSIS — R45851 Suicidal ideations: Secondary | ICD-10-CM

## 2014-07-29 DIAGNOSIS — G4700 Insomnia, unspecified: Secondary | ICD-10-CM | POA: Diagnosis present

## 2014-07-29 DIAGNOSIS — F1410 Cocaine abuse, uncomplicated: Secondary | ICD-10-CM | POA: Diagnosis present

## 2014-07-29 DIAGNOSIS — Z833 Family history of diabetes mellitus: Secondary | ICD-10-CM

## 2014-07-29 DIAGNOSIS — F1014 Alcohol abuse with alcohol-induced mood disorder: Secondary | ICD-10-CM | POA: Diagnosis present

## 2014-07-29 DIAGNOSIS — F419 Anxiety disorder, unspecified: Secondary | ICD-10-CM | POA: Diagnosis present

## 2014-07-29 DIAGNOSIS — Z8249 Family history of ischemic heart disease and other diseases of the circulatory system: Secondary | ICD-10-CM

## 2014-07-29 HISTORY — DX: Unspecified convulsions: R56.9

## 2014-07-29 MED ORDER — CHLORDIAZEPOXIDE HCL 25 MG PO CAPS: 25.0000 mg | ORAL_CAPSULE | Freq: Four times a day (QID) | ORAL | Status: AC | PRN

## 2014-07-29 MED ORDER — CHLORDIAZEPOXIDE HCL 25 MG PO CAPS
25.0000 mg | ORAL_CAPSULE | Freq: Three times a day (TID) | ORAL | Status: AC
  Administered 2014-07-30 – 2014-07-31 (×3): 25 mg via ORAL
  Filled 2014-07-29 (×3): qty 1

## 2014-07-29 MED ORDER — ADULT MULTIVITAMIN W/MINERALS CH
1.0000 | ORAL_TABLET | Freq: Every day | ORAL | Status: DC
  Administered 2014-07-29 – 2014-08-03 (×6): 1 via ORAL
  Filled 2014-07-29 (×8): qty 1

## 2014-07-29 MED ORDER — LOPERAMIDE HCL 2 MG PO CAPS: 2.0000 mg | ORAL_CAPSULE | ORAL | Status: AC | PRN

## 2014-07-29 MED ORDER — CHLORDIAZEPOXIDE HCL 25 MG PO CAPS
25.0000 mg | ORAL_CAPSULE | Freq: Every day | ORAL | Status: AC
  Administered 2014-08-02: 25 mg via ORAL
  Filled 2014-07-29: qty 1

## 2014-07-29 MED ORDER — ONDANSETRON 4 MG PO TBDP: 4.0000 mg | ORAL_TABLET | Freq: Four times a day (QID) | ORAL | Status: AC | PRN

## 2014-07-29 MED ORDER — THIAMINE HCL 100 MG/ML IJ SOLN
100.0000 mg | Freq: Once | INTRAMUSCULAR | Status: AC
  Administered 2014-07-29: 100 mg via INTRAMUSCULAR
  Filled 2014-07-29: qty 2

## 2014-07-29 MED ORDER — ALUM & MAG HYDROXIDE-SIMETH 200-200-20 MG/5ML PO SUSP: 30.0000 mL | ORAL | Status: DC | PRN

## 2014-07-29 MED ORDER — CHLORDIAZEPOXIDE HCL 25 MG PO CAPS
25.0000 mg | ORAL_CAPSULE | Freq: Four times a day (QID) | ORAL | Status: AC
  Administered 2014-07-29 – 2014-07-30 (×6): 25 mg via ORAL
  Filled 2014-07-29 (×6): qty 1

## 2014-07-29 MED ORDER — VITAMIN B-1 100 MG PO TABS
100.0000 mg | ORAL_TABLET | Freq: Every day | ORAL | Status: DC
  Administered 2014-07-30 – 2014-08-03 (×5): 100 mg via ORAL
  Filled 2014-07-29 (×6): qty 1

## 2014-07-29 MED ORDER — CHLORDIAZEPOXIDE HCL 25 MG PO CAPS
25.0000 mg | ORAL_CAPSULE | ORAL | Status: AC
  Administered 2014-07-31 – 2014-08-01 (×2): 25 mg via ORAL
  Filled 2014-07-29 (×2): qty 1

## 2014-07-29 MED ORDER — MAGNESIUM HYDROXIDE 400 MG/5ML PO SUSP: 30.0000 mL | Freq: Every day | ORAL | Status: DC | PRN

## 2014-07-29 MED ORDER — ACETAMINOPHEN 325 MG PO TABS
650.0000 mg | ORAL_TABLET | Freq: Four times a day (QID) | ORAL | Status: DC | PRN
  Administered 2014-07-30 – 2014-07-31 (×2): 650 mg via ORAL
  Filled 2014-07-29 (×2): qty 2

## 2014-07-29 MED ORDER — HYDROXYZINE HCL 25 MG PO TABS
25.0000 mg | ORAL_TABLET | Freq: Four times a day (QID) | ORAL | Status: AC | PRN
  Administered 2014-07-30 (×2): 25 mg via ORAL
  Filled 2014-07-29 (×2): qty 1

## 2014-07-29 NOTE — Tx Team (Signed)
Initial Interdisciplinary Treatment Plan   PATIENT STRESSORS: Financial difficulties Substance abuse   PATIENT STRENGTHS: Average or above average intelligence Communication skills Supportive family/friends Work skills   PROBLEM LIST: Problem List/Patient Goals Date to be addressed Date deferred Reason deferred Estimated date of resolution  Coping skills for depression      Substance abuse cocaine and ETOH- assistance with withdrawals/ detox      SI thought /no plan                                           DISCHARGE CRITERIA:  Improved stabilization in mood, thinking, and/or behavior Motivation to continue treatment in a less acute level of care Verbal commitment to aftercare and medication compliance Withdrawal symptoms are absent or subacute and managed without 24-hour nursing intervention  PRELIMINARY DISCHARGE PLAN: Outpatient therapy Participate in family therapy Return to previous living arrangement  PATIENT/FAMIILY INVOLVEMENT: This treatment plan has been presented to and reviewed with the patient, Samuel Atkinson, and/or family member, .  The patient and family have been given the opportunity to ask questions and make suggestions.  Cooper RenderSadler, Tracee Mccreery Jean Horne 07/29/2014, 4:14 AM

## 2014-07-29 NOTE — H&P (Signed)
Psychiatric Admission Assessment Adult  Patient Identification: Samuel Atkinson MRN:  [id number] Date of Evaluation:  07/29/2014 Chief Complaint:  mdd,rec,sev Principal Diagnosis: <principal problem not specified> Diagnosis:   Patient Active Problem List   Diagnosis Date Noted  . Polysubstance abuse [F19.10]   . Suicidal ideation [R45.851]   . MDD (major depressive disorder) [F32.2] 03/27/2014  . 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 Cocaine abuse [F14.10] 03/26/2014  . Depression [F32.9] 03/26/2014  . Seizures [R56.9] 02/09/2013  . Cerebral AVM [Q28.2] 02/09/2013   History of Present Illness: [name] is 49 years old, an African-American male, admitted from the [facility]. He reports, "My wife took me to the hospital last night. I had started using drugs & alcohol again. I relapsed 2 days ago. I got very stressed, became very anxious I relapsed. I guess I did not deal with things the way that I should. I'm struggling with life in general. I was sober x 4 months before relapsing 2 days ago. I was just getting out of the [facility] for drug & alcohol abuse rehab. I was trying to overdose on drugs".   Elements:  Location:  Polysubstance dependence. Quality:  stressed, high anxiety, relapsed on drugs & alcohol. Severity:  Severe. Timing:  "I relapsed 2 days ago". Duration:  Chronic drug & alcohol problems. Context:  Sober x 4 mos, got stressed, relapsed 2 days ago.  Associated Signs/Symptoms:  Depression Symptoms:  depressed mood, insomnia, feelings of worthlessness/guilt, hopelessness, anxiety,  (Hypo) Manic Symptoms:  Impulsivity, Irritable Mood,  Anxiety Symptoms:  Excessive Worry,  Psychotic Symptoms:  Denies  PTSD Symptoms: NA  Total Time spent with patient: 1 hour  Past Medical History:  Past Medical History  Diagnosis Date  . Hypertension   . Seizure disorder   . Hx of substance abuse   . AVM (arteriovenous  malformation)   . Seizures     Past Surgical History  Procedure Laterality Date  . Avm embolization  2013   Family History:  Family History  Problem Relation Age of Onset  . Hypertension Father   . Coronary artery disease Father   . Diabetes Mother   . Hypertension Mother   . Coronary artery disease Brother   . Healthy Brother   . Coronary artery disease Brother    Social History:  History  Alcohol Use  . Yes    Comment: Unsure of amount / pt. states he drank $150 worth ETOH in 2 days     History  Drug Use  . Yes  . Special: Cocaine    Comment: Unsure of amount /states $700 worth in 2 days    History   Social History  . Marital Status: Single    Spouse Name: [name]    Number of Children: 3  . Years of Education: GED    Occupational History  . Unemployed    Social History Main Topics  . Smoking status: Current Every Day Smoker -- 0.50 packs/day    Types: Cigarettes  . Smokeless tobacco: Never Used  . Alcohol Use: Yes     Comment: Unsure of amount / pt. states he drank $150 worth ETOH in 2 days  . Drug Use: Yes    Special: Cocaine     Comment: Unsure of amount /states $700 worth in 2 days  . Sexual Activity: Yes    Birth Control/ Protection: None   Other Topics Concern  . None   Social History Narrative  Patient lives at home with [name]). Patient works full time time.   Education 12 th grade.   Caffeine two cups of coffee daily. Three mountain dews daily.   Right handed.   Additional Social History:    Pain Medications: denies  Prescriptions: see PTA Over the Counter: see PTA History of alcohol / drug use?: Yes Longest period of sobriety (when/how long): 2 years Negative Consequences of Use: Financial, Personal relationships, Work / Youth worker Withdrawal Symptoms: Agitation, Irritability, Tremors Name of Substance 1: cocaine 1 - Age of First Use: unsure 1 - Amount (size/oz): states he has spent $700 in 2 days 1 - Frequency: daily 1 - Duration:  ongoing 1 - Last Use / Amount: 07/28/2014 Name of Substance 2: alcohol 2 - Age of First Use: unsure 2 - Amount (size/oz): reports he has spent $150 on alcohol in last 2 days 2 - Frequency: daily 2 - Duration: ongoing 2 - Last Use / Amount: 07-28-2014  Musculoskeletal: Strength & Muscle Tone: within normal limits Gait & Station: normal Patient leans: N/A  Psychiatric Specialty Exam: Physical Exam  Constitutional: He is oriented to person, place, and time. He appears well-developed and well-nourished.  HENT:  Head: Normocephalic.  Eyes: Pupils are equal, round, and reactive to light.  Neck: Normal range of motion.  Cardiovascular: Normal rate.   Respiratory: Effort normal and breath sounds normal.  GI: Soft.  Genitourinary:  Denies any issues in this area  Musculoskeletal: Normal range of motion.  Neurological: He is alert and oriented to person, place, and time.  Skin: Skin is warm and dry.  Psychiatric: His speech is normal and behavior is normal. Thought content normal. His mood appears anxious. His affect is not angry, not blunt, not labile and not inappropriate. Cognition and memory are normal. He expresses impulsivity. He exhibits a depressed mood.    Review of Systems  Constitutional: Negative.   HENT: Negative.   Eyes: Negative.   Respiratory: Negative.   Cardiovascular: Negative.   Gastrointestinal: Negative.   Genitourinary: Negative.   Musculoskeletal: Negative.   Skin: Negative.   Neurological: Negative.   Endo/Heme/Allergies: Negative.   Psychiatric/Behavioral: Positive for depression and substance abuse (Alcohol & cocaine abuse). Negative for suicidal ideas, hallucinations and memory loss. The patient is nervous/anxious and has insomnia.     Blood pressure 108/61, pulse 75, temperature 98.2 F (36.8 C), temperature source Oral, resp. rate 16, height $RemoveBe'5\' 10"'pqVMcrSoT$  (1.778 m), weight 73.936 kg (163 lb).Body mass index is 23.39 kg/(m^2).  General Appearance: Casual  Eye  Contact::  Poor  Speech:  Clear and Coherent  Volume:  Decreased  Mood:  Anxious and Depressed  Affect:  Restricted  Thought Process:  Coherent  Orientation:  Full (Time, Place, and Person)  Thought Content:  Rumination  Suicidal Thoughts:  No  Homicidal Thoughts:  No  Memory:  Immediate;   Good Recent;   Good Remote;   Good  Judgement:  Impaired  Insight:  Shallow  Psychomotor Activity:  Tremor  Concentration:  Fair  Recall:  [place] of Knowledge:Fair  Language: Good  Akathisia:  No  Handed:  Right  AIMS (if indicated):     Assets:  Desire for Improvement  ADL's:  Intact  Cognition: WNL  Sleep:  Number of Hours: 2.25   Risk to Self: Is patient at risk for suicide?: No Risk to Others: No Prior Inpatient Therapy: No Prior Outpatient Therapy: o  Alcohol Screening: 1. How often do you have a drink containing alcohol?:  4 or more times a week 2. How many drinks containing alcohol do you have on a typical day when you are drinking?: 10 or more 3. How often do you have six or more drinks on one occasion?: Daily or almost daily Preliminary Score: 8 4. How often during the last year have you found that you were not able to stop drinking once you had started?: Daily or almost daily 5. How often during the last year have you failed to do what was normally expected from you becasue of drinking?: Weekly 6. How often during the last year have you needed a first drink in the morning to get yourself going after a heavy drinking session?: Weekly 7. How often during the last year have you had a feeling of guilt of remorse after drinking?: Daily or almost daily 8. How often during the last year have you been unable to remember what happened the night before because you had been drinking?: Weekly 9. Have you or someone else been injured as a result of your drinking?: No 10. Has a relative or friend or a doctor or another health worker been concerned about your drinking or suggested you cut  down?: Yes, during the last year Alcohol Use Disorder Identification Test Final Score (AUDIT): 33 Brief Intervention: Yes  Allergies:   Allergies  Allergen Reactions  . Latex Rash   Lab Results:  Results for orders placed or performed during the hospital encounter of 07/28/14 (from the past 48 hour(s))  Comprehensive metabolic panel     Status: Abnormal   Collection Time: 07/28/14  2:10 PM  Result Value Ref Range   Sodium 141 135 - 145 mmol/L   Potassium 3.5 3.5 - 5.1 mmol/L   Chloride 106 96 - 112 mmol/L   CO2 22 19 - 32 mmol/L   Glucose, Bld 127 (H) 70 - 99 mg/dL   BUN 14 6 - 23 mg/dL   Creatinine, Ser 0.89 0.50 - 1.35 mg/dL   Calcium 8.9 8.4 - 10.5 mg/dL   Total Protein 7.3 6.0 - 8.3 g/dL   Albumin 4.2 3.5 - 5.2 g/dL   AST 38 (H) 0 - 37 U/L   ALT 21 0 - 53 U/L   Alkaline Phosphatase 67 39 - 117 U/L   Total Bilirubin 0.9 0.3 - 1.2 mg/dL   GFR calc non Af Amer >90 >90 mL/min   GFR calc Af Amer >90 >90 mL/min    Comment: (NOTE) The eGFR has been calculated using the CKD EPI equation. This calculation has not been validated in all clinical situations. eGFR's persistently <90 mL/min signify possible Chronic Kidney Disease.    Anion gap 13 5 - 15  CBC with Differential     Status: None   Collection Time: 07/28/14  2:10 PM  Result Value Ref Range   WBC 8.6 4.0 - 10.5 K/uL   RBC 4.64 4.22 - 5.81 MIL/uL   Hemoglobin 15.2 13.0 - 17.0 g/dL   HCT 43.7 39.0 - 52.0 %   MCV 94.2 78.0 - 100.0 fL   MCH 32.8 26.0 - 34.0 pg   MCHC 34.8 30.0 - 36.0 g/dL   RDW 12.9 11.5 - 15.5 %   Platelets 192 150 - 400 K/uL   Neutrophils Relative % 65 43 - 77 %   Neutro Abs 5.6 1.7 - 7.7 K/uL   Lymphocytes Relative 25 12 - 46 %   Lymphs Abs 2.1 0.7 - 4.0 K/uL   Monocytes Relative 10 3 - 12 %  Monocytes Absolute 0.9 0.1 - 1.0 K/uL   Eosinophils Relative 0 0 - 5 %   Eosinophils Absolute 0.0 0.0 - 0.7 K/uL   Basophils Relative 0 0 - 1 %   Basophils Absolute 0.0 0.0 - 0.1 K/uL  Ethanol      Status: Abnormal   Collection Time: 07/28/14  2:13 PM  Result Value Ref Range   Alcohol, Ethyl (B) 105 (H) 0 - 9 mg/dL    Comment:        LOWEST DETECTABLE LIMIT FOR SERUM ALCOHOL IS 11 mg/dL FOR MEDICAL PURPOSES ONLY   Acetaminophen level     Status: Abnormal   Collection Time: 07/28/14  2:13 PM  Result Value Ref Range   Acetaminophen (Tylenol), Serum <10.0 (L) 10 - 30 ug/mL    Comment:        THERAPEUTIC CONCENTRATIONS VARY SIGNIFICANTLY. A RANGE OF 10-30 ug/mL MAY BE AN EFFECTIVE CONCENTRATION FOR MANY PATIENTS. HOWEVER, SOME ARE BEST TREATED AT CONCENTRATIONS OUTSIDE THIS RANGE. ACETAMINOPHEN CONCENTRATIONS >150 ug/mL AT 4 HOURS AFTER INGESTION AND >50 ug/mL AT 12 HOURS AFTER INGESTION ARE OFTEN ASSOCIATED WITH TOXIC REACTIONS.   Salicylate level     Status: None   Collection Time: 07/28/14  2:13 PM  Result Value Ref Range   Salicylate Lvl <2.9 2.8 - 20.0 mg/dL  Urinalysis, Routine w reflex microscopic     Status: Abnormal   Collection Time: 07/28/14  3:37 PM  Result Value Ref Range   Color, Urine YELLOW YELLOW   APPearance CLEAR CLEAR   Specific Gravity, Urine 1.004 (L) 1.005 - 1.030   pH 5.5 5.0 - 8.0   Glucose, UA NEGATIVE NEGATIVE mg/dL   Hgb urine dipstick NEGATIVE NEGATIVE   Bilirubin Urine NEGATIVE NEGATIVE   Ketones, ur NEGATIVE NEGATIVE mg/dL   Protein, ur NEGATIVE NEGATIVE mg/dL   Urobilinogen, UA 0.2 0.0 - 1.0 mg/dL   Nitrite NEGATIVE NEGATIVE   Leukocytes, UA NEGATIVE NEGATIVE    Comment: MICROSCOPIC NOT DONE ON URINES WITH NEGATIVE PROTEIN, BLOOD, LEUKOCYTES, NITRITE, OR GLUCOSE <1000 mg/dL.  Urine rapid drug screen (hosp performed)     Status: Abnormal   Collection Time: 07/28/14  3:37 PM  Result Value Ref Range   Opiates NONE DETECTED NONE DETECTED   Cocaine POSITIVE (A) NONE DETECTED   Benzodiazepines NONE DETECTED NONE DETECTED   Amphetamines NONE DETECTED NONE DETECTED   Tetrahydrocannabinol NONE DETECTED NONE DETECTED   Barbiturates NONE  DETECTED NONE DETECTED    Comment:        DRUG SCREEN FOR MEDICAL PURPOSES ONLY.  IF CONFIRMATION IS NEEDED FOR ANY PURPOSE, NOTIFY LAB WITHIN 5 DAYS.        LOWEST DETECTABLE LIMITS FOR URINE DRUG SCREEN Drug Class       Cutoff (ng/mL) Amphetamine      1000 Barbiturate      200 Benzodiazepine   528 Tricyclics       413 Opiates          300 Cocaine          300 THC              50    Current Medications: Current Facility-Administered Medications  Medication Dose Route Frequency Provider Last Rate Last Dose  . acetaminophen (TYLENOL) tablet 650 mg  650 mg Oral Q6H PRN Evanna Glenda Chroman, NP      . alum & mag hydroxide-simeth (MAALOX/MYLANTA) 200-200-20 MG/5ML suspension 30 mL  30 mL Oral Q4H PRN Evanna Glenda Chroman, NP      .  chlordiazePOXIDE (LIBRIUM) capsule 25 mg  25 mg Oral Q6H PRN Evanna Janann August, NP      . chlordiazePOXIDE (LIBRIUM) capsule 25 mg  25 mg Oral QID Audrea Muscat, NP   25 mg at 07/29/14 1221   Followed by  . [START ON 07/30/2014] chlordiazePOXIDE (LIBRIUM) capsule 25 mg  25 mg Oral TID Audrea Muscat, NP       Followed by  . [START ON 07/31/2014] chlordiazePOXIDE (LIBRIUM) capsule 25 mg  25 mg Oral BH-qamhs Evanna Janann August, NP       Followed by  . [START ON 08/02/2014] chlordiazePOXIDE (LIBRIUM) capsule 25 mg  25 mg Oral Daily Evanna Janann August, NP      . hydrOXYzine (ATARAX/VISTARIL) tablet 25 mg  25 mg Oral Q6H PRN Evanna Janann August, NP      . loperamide (IMODIUM) capsule 2-4 mg  2-4 mg Oral PRN Evanna Janann August, NP      . magnesium hydroxide (MILK OF MAGNESIA) suspension 30 mL  30 mL Oral Daily PRN Evanna Janann August, NP      . multivitamin with minerals tablet 1 tablet  1 tablet Oral Daily Evanna Janann August, NP   1 tablet at 07/29/14 0818  . ondansetron (ZOFRAN-ODT) disintegrating tablet 4 mg  4 mg Oral Q6H PRN Evanna Janann August, NP      . thiamine (B-1) injection 100 mg  100 mg Intramuscular Once Evanna Janann August, NP      .  Melene Muller ON 07/30/2014] thiamine (VITAMIN B-1) tablet 100 mg  100 mg Oral Daily Evanna Janann August, NP       PTA Medications: Prescriptions prior to admission  Medication Sig Dispense Refill Last Dose  . albuterol (PROVENTIL HFA;VENTOLIN HFA) 108 (90 BASE) MCG/ACT inhaler Inhale 1-2 puffs into the lungs every 6 (six) hours as needed for wheezing or shortness of breath.   Past Month at Unknown time  . amLODipine (NORVASC) 10 MG tablet Take 1 tablet (10 mg total) by mouth daily.   07/28/2014 at Unknown time  . B Complex-Biotin-FA (SUPER B-50 COMPLEX) CAPS Take 1 capsule by mouth daily.   07/28/2014 at Unknown time  . Calcium-Magnesium (CALCIUM MAGNESIUM 750) 300-300 MG TABS Take 1 tablet by mouth daily.  0 07/28/2014 at Unknown time  . Cholecalciferol (VITAMIN D-3) 5000 UNITS TABS Take 5,000 Units by mouth daily. 30 tablet  07/28/2014 at Unknown time  . escitalopram (LEXAPRO) 10 MG tablet Take 1 tablet (10 mg total) by mouth daily.   07/28/2014 at Unknown time  . levETIRAcetam (KEPPRA XR) 500 MG 24 hr tablet Take 3 tablets (1,500 mg total) by mouth daily. 270 tablet 3 07/28/2014 at Unknown time  . naltrexone (DEPADE) 50 MG tablet Take 1 tablet (50 mg total) by mouth daily. 30 tablet 3 More than a month at Unknown time  . tiotropium (SPIRIVA) 18 MCG inhalation capsule Place 18 mcg into inhaler and inhale daily as needed (for shortness of breath).   More than a month at Unknown time    Previous Psychotropic Medications: Yes   Substance Abuse History in the last 12 months:  Yes.    Consequences of Substance Abuse: Medical Consequences:  Liver damage, Possible death by overdose Legal Consequences:  Arrests, jail time, Loss of driving privilege. Family Consequences:  Family discord, divorce and or separation.  Results for orders placed or performed during the hospital encounter of 07/28/14 (from the past 72 hour(s))  Comprehensive metabolic panel     Status:  Abnormal   Collection Time: 07/28/14  2:10 PM   Result Value Ref Range   Sodium 141 135 - 145 mmol/L   Potassium 3.5 3.5 - 5.1 mmol/L   Chloride 106 96 - 112 mmol/L   CO2 22 19 - 32 mmol/L   Glucose, Bld 127 (H) 70 - 99 mg/dL   BUN 14 6 - 23 mg/dL   Creatinine, Ser 0.89 0.50 - 1.35 mg/dL   Calcium 8.9 8.4 - 10.5 mg/dL   Total Protein 7.3 6.0 - 8.3 g/dL   Albumin 4.2 3.5 - 5.2 g/dL   AST 38 (H) 0 - 37 U/L   ALT 21 0 - 53 U/L   Alkaline Phosphatase 67 39 - 117 U/L   Total Bilirubin 0.9 0.3 - 1.2 mg/dL   GFR calc non Af Amer >90 >90 mL/min   GFR calc Af Amer >90 >90 mL/min    Comment: (NOTE) The eGFR has been calculated using the CKD EPI equation. This calculation has not been validated in all clinical situations. eGFR's persistently <90 mL/min signify possible Chronic Kidney Disease.    Anion gap 13 5 - 15  CBC with Differential     Status: None   Collection Time: 07/28/14  2:10 PM  Result Value Ref Range   WBC 8.6 4.0 - 10.5 K/uL   RBC 4.64 4.22 - 5.81 MIL/uL   Hemoglobin 15.2 13.0 - 17.0 g/dL   HCT 43.7 39.0 - 52.0 %   MCV 94.2 78.0 - 100.0 fL   MCH 32.8 26.0 - 34.0 pg   MCHC 34.8 30.0 - 36.0 g/dL   RDW 12.9 11.5 - 15.5 %   Platelets 192 150 - 400 K/uL   Neutrophils Relative % 65 43 - 77 %   Neutro Abs 5.6 1.7 - 7.7 K/uL   Lymphocytes Relative 25 12 - 46 %   Lymphs Abs 2.1 0.7 - 4.0 K/uL   Monocytes Relative 10 3 - 12 %   Monocytes Absolute 0.9 0.1 - 1.0 K/uL   Eosinophils Relative 0 0 - 5 %   Eosinophils Absolute 0.0 0.0 - 0.7 K/uL   Basophils Relative 0 0 - 1 %   Basophils Absolute 0.0 0.0 - 0.1 K/uL  Ethanol     Status: Abnormal   Collection Time: 07/28/14  2:13 PM  Result Value Ref Range   Alcohol, Ethyl (B) 105 (H) 0 - 9 mg/dL    Comment:        LOWEST DETECTABLE LIMIT FOR SERUM ALCOHOL IS 11 mg/dL FOR MEDICAL PURPOSES ONLY   Acetaminophen level     Status: Abnormal   Collection Time: 07/28/14  2:13 PM  Result Value Ref Range   Acetaminophen (Tylenol), Serum <10.0 (L) 10 - 30 ug/mL    Comment:         THERAPEUTIC CONCENTRATIONS VARY SIGNIFICANTLY. A RANGE OF 10-30 ug/mL MAY BE AN EFFECTIVE CONCENTRATION FOR MANY PATIENTS. HOWEVER, SOME ARE BEST TREATED AT CONCENTRATIONS OUTSIDE THIS RANGE. ACETAMINOPHEN CONCENTRATIONS >150 ug/mL AT 4 HOURS AFTER INGESTION AND >50 ug/mL AT 12 HOURS AFTER INGESTION ARE OFTEN ASSOCIATED WITH TOXIC REACTIONS.   Salicylate level     Status: None   Collection Time: 07/28/14  2:13 PM  Result Value Ref Range   Salicylate Lvl <5.1 2.8 - 20.0 mg/dL  Urinalysis, Routine w reflex microscopic     Status: Abnormal   Collection Time: 07/28/14  3:37 PM  Result Value Ref Range   Color, Urine YELLOW YELLOW   APPearance  CLEAR CLEAR   Specific Gravity, Urine 1.004 (L) 1.005 - 1.030   pH 5.5 5.0 - 8.0   Glucose, UA NEGATIVE NEGATIVE mg/dL   Hgb urine dipstick NEGATIVE NEGATIVE   Bilirubin Urine NEGATIVE NEGATIVE   Ketones, ur NEGATIVE NEGATIVE mg/dL   Protein, ur NEGATIVE NEGATIVE mg/dL   Urobilinogen, UA 0.2 0.0 - 1.0 mg/dL   Nitrite NEGATIVE NEGATIVE   Leukocytes, UA NEGATIVE NEGATIVE    Comment: MICROSCOPIC NOT DONE ON URINES WITH NEGATIVE PROTEIN, BLOOD, LEUKOCYTES, NITRITE, OR GLUCOSE <1000 mg/dL.  Urine rapid drug screen (hosp performed)     Status: Abnormal   Collection Time: 07/28/14  3:37 PM  Result Value Ref Range   Opiates NONE DETECTED NONE DETECTED   Cocaine POSITIVE (A) NONE DETECTED   Benzodiazepines NONE DETECTED NONE DETECTED   Amphetamines NONE DETECTED NONE DETECTED   Tetrahydrocannabinol NONE DETECTED NONE DETECTED   Barbiturates NONE DETECTED NONE DETECTED    Comment:        DRUG SCREEN FOR MEDICAL PURPOSES ONLY.  IF CONFIRMATION IS NEEDED FOR ANY PURPOSE, NOTIFY LAB WITHIN 5 DAYS.        LOWEST DETECTABLE LIMITS FOR URINE DRUG SCREEN Drug Class       Cutoff (ng/mL) Amphetamine      1000 Barbiturate      200 Benzodiazepine   818 Tricyclics       299 Opiates          300 Cocaine          300 THC              50      Observation Level/Precautions:  15 minute checks  Laboratory:  Per ED  Psychotherapy: Group sessions   Medications:  See medication  Consultations:  As needed  Discharge Concerns: Safety, mood stability    Estimated LOS: 2-4 days  Other:     Psychological Evaluations: Yes   Treatment Plan Summary: Daily contact with patient to assess and evaluate symptoms and progress in treatment and Medication management; 1. Admit for crisis management and stabilization, estimated length of stay 3-5 days.  2. Medication management to reduce current symptoms to base line and improve the patient's overall level of functioning; continue Librium detox protocols, initiate Remeron 15 mg Q bedtime for depression/insomnia. 3. Treat health problems as indicated.  4. Develop treatment plan to decrease risk of relapse upon discharge and the need for readmission.  5. Psycho-social education regarding relapse prevention and self care.  6. Health care follow up as needed for medical problems.  7. Review, reconcile, and reinstate any pertinent home medications for other health issues where appropriate. 8. Call for consults with hospitalist for any additional specialty patient care services as needed.  Medical Decision Making:  New problem, with additional work up planned, Review of Psycho-Social Stressors (1), Review and summation of old records (2), Review of Medication Regimen & Side Effects (2) and Review of New Medication or Change in Dosage (2)  I certify that inpatient services furnished can reasonably be expected to improve the patient's condition.   Lindell Spar I, PMHNP-BC 1/30/20161:30 PM I personally assessed the patient, reviewed the physical exam and labs and formulated the treatment plan Geralyn Flash A. Sabra Heck, M.D.

## 2014-07-29 NOTE — ED Notes (Signed)
Pt transported to [facility] by [facility] transportation service for continuation of specialized care. He left in no acute distress. 

## 2014-07-29 NOTE — Plan of Care (Signed)
Problem: Ineffective individual coping Goal: STG: Patient will remain free from self harm Outcome: Progressing Patient remains free from self harm. 15 minute checks continued per protocol for patient safety.   Problem: Diagnosis: Increased Risk For Suicide Attempt Goal: STG-Patient Will Attend All Groups On The Unit Outcome: Not Progressing Patient is not attending unit groups and remains resting in bed the majority of the day.

## 2014-07-29 NOTE — [facility] Group Notes (Signed)
[facility] Group Notes:  (Nursing/MHT/Case Management/Adjunct)  Date:  07/29/2014  Time:  1000am  Type of Therapy:  Orientation/Self Inventory Group  Participation Level:  Did Not Attend  Participation Quality:  Did not attend  Affect:  Did not attend  Cognitive:  Did not attend  Insight:  None  Engagement in Group:  Did not attend  Modes of Intervention:  Discussion, Education and Support  Summary of Progress/Problems: Patient was encouraged to attend group but did not and remained in bed resting.  Lendell CapriceGuthrie, Yessika Otte A 07/29/2014, 12:46 PM

## 2014-07-29 NOTE — [facility] Group Notes (Signed)
[facility] Group Notes: (Clinical Social Work)   07/29/2014      Type of Therapy:  Group Therapy   Participation Level:  Did Not Attend - was invited by MHT, but declined   Ambrose MantleMareida Grossman-Orr, LCSW 07/29/2014, 4:02 PM

## 2014-07-29 NOTE — [facility] Suicide Risk Assessment (Signed)
[facility] Admission Suicide Risk Assessment   Nursing information obtained from:  Patient Demographic factors:  Male, Low socioeconomic status, Unemployed Current Mental Status:  Intention to act on plan to harm others Loss Factors:  Financial problems / change in socioeconomic status Historical Factors:  Family history of mental illness or substance abuse Risk Reduction Factors:  Sense of responsibility to family, Living with another person, especially a relative, Positive social support Total Time spent with patient: 30 minutes Principal Problem: <principal problem not specified> Diagnosis:   Patient Active Problem List   Diagnosis Date Noted  . Polysubstance abuse [F19.10]   . Suicidal ideation [R45.851]   . MDD (major depressive disorder) [F32.2] 03/27/2014  . 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 Cocaine abuse [F14.10] 03/26/2014  . Depression [F32.9] 03/26/2014  . Seizures [R56.9] 02/09/2013  . Cerebral AVM [Q28.2] 02/09/2013     Continued Clinical Symptoms:  Alcohol Use Disorder Identification Test Final Score (AUDIT): 33 The "Alcohol Use Disorders Identification Test", Guidelines for Use in Primary Care, Second Edition.  World Science writer [facility]). Score between 0-7:  no or low risk or alcohol related problems. Score between 8-15:  moderate risk of alcohol related problems. Score between 16-19:  high risk of alcohol related problems. Score 20 or above:  warrants further diagnostic evaluation for alcohol dependence and treatment.   CLINICAL FACTORS:   Depression:   Comorbid alcohol abuse/dependence Alcohol/Substance Abuse/Dependencies   Musculoskeletal: Strength & Muscle Tone: within normal limits Gait & Station: normal Patient leans: N/A  Psychiatric Specialty Exam: Physical Exam  Review of Systems  Constitutional: Negative.   HENT: Negative.   Eyes: Negative.   Respiratory: Negative.   Cardiovascular: Negative.    Gastrointestinal: Negative.   Genitourinary: Negative.   Musculoskeletal: Negative.   Skin: Negative.   Neurological: Negative.   Endo/Heme/Allergies: Negative.   Psychiatric/Behavioral: Positive for depression and substance abuse. The patient is nervous/anxious and has insomnia.     Blood pressure 108/61, pulse 75, temperature 98.2 F (36.8 C), temperature source Oral, resp. rate 16, height  (1.778 m), weight 73.936 kg (163 lb).Body mass index is 23.39 kg/(m^2).  General Appearance: Fairly Groomed  Patent attorney::  Fair  Speech:  Clear and Coherent  Volume:  Normal  Mood:  Anxious and worried  Affect:  anxious worried sad  Thought Process:  Coherent and Goal Directed  Orientation:  Full (Time, Place, and Person)  Thought Content:  events symptoms worries concerns regrets sense of shame and guilt  Suicidal Thoughts:  No  Homicidal Thoughts:  No  Memory:  Immediate;   Fair Recent;   Fair Remote;   Fair  Judgement:  Fair  Insight:  Present and Shallow  Psychomotor Activity:  Restlessness  Concentration:  Fair  Recall:  [employer] of Knowledge:Fair  Language: Fair  Akathisia:  No  Handed:  Right  AIMS (if indicated):     Assets:  Desire for Improvement Housing Social Support  Sleep:  Number of Hours: 2.25  Cognition: WNL  ADL's:  Intact     COGNITIVE FEATURES THAT CONTRIBUTE TO RISK:  Closed-mindedness, Polarized thinking and Thought constriction (tunnel vision)    SUICIDE RISK:   Moderate:  Frequent suicidal ideation with limited intensity, and duration, some specificity in terms of plans, no associated intent, good self-control, limited dysphoria/symptomatology, some risk factors present, and identifiable protective factors, including available and accessible social support.  PLAN OF CARE: Supportive approach/coping skills/relapse prevention  Assess for S/S of withdrawal                               Reassess and address the co  morbidities                               Assess for need to go into a residential treatment program  Medical Decision Making:  Review of Psycho-Social Stressors (1), Review of Medication Regimen & Side Effects (2) and Review of New Medication or Change in Dosage (2)  I certify that inpatient services furnished can reasonably be expected to improve the patient's condition.   Jacorie Ernsberger A 07/29/2014, 3:29 PM

## 2014-07-29 NOTE — [facility] Group Notes (Signed)
[facility] Group Notes:  (Nursing/MHT/Case Management/Adjunct)  Date:  07/29/2014  Time:  1030am  Type of Therapy:  Psychoeducational Skills  Participation Level:  Did Not Attend  Participation Quality:  Did not attend  Affect:  Did not attend  Cognitive:  Did not attend  Insight:  None  Engagement in Group:  Did not attend  Modes of Intervention:  Discussion, Education and Support  Summary of Progress/Problems: Patient was encouraged to attend group but did not and remained in bed resting.  Lendell CapriceGuthrie, Egbert Seidel A 07/29/2014, 12:47 PM

## 2014-07-29 NOTE — Progress Notes (Signed)
D: Patient is alert and oriented. Pt's mood and affect is "tired" and blunted. Pt's eye contact is fair. Pt denies SI/HI and AVH at this time. Pt denies having any withdrawal symptoms. Pt reports "I just got a lot on my mind." Pt remains in bed resting the majority of the day and minimally interacts with RN. Pt irritable at times. A: Encouragement/Support provided to pt. Pt encouraged to speak with RN. Scheduled medications administered per providers orders (See MAR). 15 minute checks continued per protocol for patient safety.  R: Patient cooperative and receptive to nursing interventions. Pt remains safe.

## 2014-07-29 NOTE — Progress Notes (Signed)
Pt. Presents from [facility] reporting SI with no plan, which he currently denies.  Pt. Request assistance to detox from alcohol and cocaine abuse. Denies previous [facility] inpatients but was inpatient [facility] [place] , which he reports was a 4 month program.  Pt. ETOH was 105 and he was positive for cocaine.  Pt. Reports to writer that he drank $150. Worth of alcohol for the last 2 days prior to admit and used $700 worth of crack cocaine.  Looking through the chart it was noted pt. Was in the ED on 27th and 28th.  Pt. Has history of HTN, seizures ( reports years since last seizure), substance abuse, AVM (arteriovenous malformation), and SI attempt in 2013 (OD). Pt. Was sleepy and difficult to keep on track during the admission process.  Pt. Was offered food and fluids and escorted to the adult unit where he was oriented to the 500 hall.

## 2014-07-30 ENCOUNTER — Encounter (HOSPITAL_COMMUNITY): Payer: Self-pay | Admitting: *Deleted

## 2014-07-30 DIAGNOSIS — F1010 Alcohol abuse, uncomplicated: Secondary | ICD-10-CM

## 2014-07-30 MED ORDER — RISPERIDONE 2 MG PO TABS
2.0000 mg | ORAL_TABLET | Freq: Every day | ORAL | Status: DC
  Administered 2014-07-30 – 2014-08-02 (×4): 2 mg via ORAL
  Filled 2014-07-30 (×7): qty 1

## 2014-07-30 NOTE — Progress Notes (Signed)
D: Pt has blunted affect and depressed mood.  Pt reports his goal today was "to make it through today."  Pt stayed in his room until it was time for evening snack.  Pt denies SI/HI, denies hallucinations, denies pain.   A: Medication administered per order.  Safety maintained.  Met with pt 1:1 and offered support and encouragement.   R: Pt verbally contracts for safety.  Will continue to monitor and assess for safety.

## 2014-07-30 NOTE — Plan of Care (Signed)
Problem: Alteration in mood & ability to function due to Goal: STG-Patient will attend groups Outcome: Not Progressing Pt did not attend evening group on 07/29/14.

## 2014-07-30 NOTE — [facility] Group Notes (Signed)
[facility] Group Notes:  (Clinical Social Work)  07/30/2014   1:15-2:15PM  Summary of Progress/Problems:  The main focus of today's process group was to   identify the patient's current support system and decide on other supports that can be put in place.  The picture on workbook was used to discuss why additional supports are needed.  An emphasis was placed on using counselor, doctor, therapy groups, 12-step groups, and problem-specific support groups to expand supports.   There was also an extensive discussion about what constitutes a healthy support versus an unhealthy support.  The patient expressed full comprehension of the concepts presented.  He demonstrated considerable insight, but stated that a lot of things regarding his supports are "in limbo."  He said he has had very good supports including his wife, friends, other addicts, his church, and his mental health professionals.  He was here just 4 months ago, however, and said that his wife has had enough.  When she dropped him off yesterday, she told him she is "done" being his enabler.  She did not come into the building with him as she did last time, but just left.  He provided good insights and suggestions to other group members also battling addiction and/or alcoholism.  Type of Therapy:  Process Group  Participation Level:  Active  Participation Quality:  Attentive and Sharing  Affect:  Blunted  Cognitive:  Appropriate and Oriented  Insight:  Engaged  Engagement in Therapy:  Engaged  Modes of Intervention:  Education,  Support and [employer]  Samuel Wann Grossman-Orr, LCSW 07/30/2014, 4:00pm

## 2014-07-30 NOTE — Progress Notes (Signed)
[facility]  07/30/2014 3:25 PM Samuel SingDavid Atkinson  MRN:  [id number]  Subjective:  Samuel Atkinson reports, "I guess I'm doing. My mind is all over the place"  Principal Problem: <principal problem not specified> Diagnosis:   Patient Active Problem List   Diagnosis Date Noted  . Polysubstance abuse [F19.10]   . Suicidal ideation [R45.851]   . MDD (major depressive disorder) [F32.2] 03/27/2014  . 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 Cocaine abuse [F14.10] 03/26/2014  . Depression [F32.9] 03/26/2014  . Seizures [R56.9] 02/09/2013  . Cerebral AVM [Q28.2] 02/09/2013   Total Time spent with patient: 36 minutes  Past Medical History:  Past Medical History  Diagnosis Date  . Hypertension   . Seizure disorder   . Hx of substance abuse   . AVM (arteriovenous malformation)   . Seizures     Past Surgical History  Procedure Laterality Date  . Avm embolization  2013   Family History:  Family History  Problem Relation Age of Onset  . Hypertension Father   . Coronary artery disease Father   . Diabetes Mother   . Hypertension Mother   . Coronary artery disease Brother   . Healthy Brother   . Coronary artery disease Brother    Social History:  History  Alcohol Use  . Yes    Comment: Unsure of amount / pt. states he drank $150 worth ETOH in 2 days     History  Drug Use  . Yes  . Special: Cocaine    Comment: Unsure of amount /states $700 worth in 2 days    History   Social History  . Marital Status: Single    Spouse Name: Vikki PortsValerie    Number of Children: 3  . Years of Education: GED    Occupational History  . Unemployed    Social History Main Topics  . Smoking status: Current Every Day Smoker -- 0.50 packs/day    Types: Cigarettes  . Smokeless tobacco: Never Used  . Alcohol Use: Yes     Comment: Unsure of amount / pt. states he drank $150 worth ETOH in 2 days  . Drug Use: Yes    Special: Cocaine     Comment: Unsure of amount /states  $700 worth in 2 days  . Sexual Activity: Yes    Birth Control/ Protection: None   Other Topics Concern  . None   Social History Narrative   Patient lives at home with [name](Valerie). Patient works full time time.   Education 12 th grade.   Caffeine two cups of coffee daily. Three mountain dews daily.   Right handed.   Additional History:    Sleep: Good  Appetite:  Good  Assessment: Patient was seen, chart reviewed. [name] Says "I guess I'm doing". He is complaining of racing mind & thoughts. Described his mood as up & down. beating himself up about his recent relapse after getting out of the [facility] [place]. Says he is interested in pursuing further substance abuse treatment, however, does not want to go back to [facility] [place], [place] not like the [place] any way. Samuel Atkinson is also worried about his wife who is currently having a hard time dealing with his drug use & recent relapse. He has agreed to try Risperdal for his racing mind. Will initiate Risperdal 2 mg.  Musculoskeletal: Strength & Muscle Tone: within normal limits Gait & Station: normal Patient leans: Right  Psychiatric Specialty Exam: Physical Exam  Review  of Systems  Constitutional: Negative.   HENT: Negative.   Eyes: Negative.   Respiratory: Negative.   Cardiovascular: Negative.   Gastrointestinal: Negative.   Genitourinary: Negative.   Musculoskeletal: Negative.   Skin: Negative.   Neurological: Negative.   Endo/Heme/Allergies: Negative.   Psychiatric/Behavioral: Positive for depression and substance abuse (Alcohol/cocaine dependence). Negative for suicidal ideas, hallucinations and memory loss. The patient is nervous/anxious. The patient does not have insomnia.     Blood pressure 126/59, pulse 64, temperature 98 F (36.7 C), temperature source Oral, resp. rate 20, height 5\' 10"  (1.778 m), weight 73.936 kg (163 lb).Body mass index is 23.39 kg/(m^2).  General Appearance: Casual  Eye Contact::  Fair   Speech:  Clear and Coherent  Volume:  Decreased  Mood:  Depressed and Hopeless  Affect:  Flat  Thought Process:  Coherent and Logical  Orientation:  Full (Time, Place, and Person)  Thought Content:  Rumination  Suicidal Thoughts:  No  Homicidal Thoughts:  No  Memory:  Immediate;   Good Recent;   Good Remote;   Good  Judgement:  Fair  Insight:  Present  Psychomotor Activity:  Worrying, frustrations  Concentration:  Poor  Recall:  Good  Fund of Knowledge:Fair  Language: Good  Akathisia:  No  Handed:  Right  AIMS (if indicated):     Assets:  Desire for Improvement  ADL's:  Intact  Cognition: WNL  Sleep:  Number of Hours: 6.75   Current Medications: Current Facility-Administered Medications  Medication Dose Route Frequency Provider Last Rate Last Dose  . acetaminophen (TYLENOL) tablet 650 mg  650 mg Oral Q6H PRN Evanna Janann August, NP      . alum & mag hydroxide-simeth (MAALOX/MYLANTA) 200-200-20 MG/5ML suspension 30 mL  30 mL Oral Q4H PRN Evanna Janann August, NP      . chlordiazePOXIDE (LIBRIUM) capsule 25 mg  25 mg Oral Q6H PRN Evanna Janann August, NP      . chlordiazePOXIDE (LIBRIUM) capsule 25 mg  25 mg Oral TID Audrea Muscat, NP       Followed by  . [START ON 07/31/2014] chlordiazePOXIDE (LIBRIUM) capsule 25 mg  25 mg Oral BH-qamhs Evanna Janann August, NP       Followed by  . [START ON 08/02/2014] chlordiazePOXIDE (LIBRIUM) capsule 25 mg  25 mg Oral Daily Evanna Cori Merry Proud, NP      . hydrOXYzine (ATARAX/VISTARIL) tablet 25 mg  25 mg Oral Q6H PRN Audrea Muscat, NP   25 mg at 07/30/14 5409  . loperamide (IMODIUM) capsule 2-4 mg  2-4 mg Oral PRN Evanna Janann August, NP      . magnesium hydroxide (MILK OF MAGNESIA) suspension 30 mL  30 mL Oral Daily PRN Evanna Janann August, NP      . multivitamin with minerals tablet 1 tablet  1 tablet Oral Daily Evanna Janann August, NP   1 tablet at 07/30/14 0858  . ondansetron (ZOFRAN-ODT) disintegrating tablet 4 mg  4 mg Oral  Q6H PRN Evanna Janann August, NP      . risperiDONE (RISPERDAL) tablet 2 mg  2 mg Oral QHS Sanjuana Kava, NP      . thiamine (VITAMIN B-1) tablet 100 mg  100 mg Oral Daily Evanna Janann August, NP   100 mg at 07/30/14 8119   Lab Results:  Results for orders placed or performed during the hospital encounter of 07/28/14 (from the past 48 hour(s))  Urinalysis, Routine w reflex microscopic     Status: Abnormal  Collection Time: 07/28/14  3:37 PM  Result Value Ref Range   Color, Urine YELLOW YELLOW   APPearance CLEAR CLEAR   Specific Gravity, Urine 1.004 (L) 1.005 - 1.030   pH 5.5 5.0 - 8.0   Glucose, UA NEGATIVE NEGATIVE mg/dL   Hgb urine dipstick NEGATIVE NEGATIVE   Bilirubin Urine NEGATIVE NEGATIVE   Ketones, ur NEGATIVE NEGATIVE mg/dL   Protein, ur NEGATIVE NEGATIVE mg/dL   Urobilinogen, UA 0.2 0.0 - 1.0 mg/dL   Nitrite NEGATIVE NEGATIVE   Leukocytes, UA NEGATIVE NEGATIVE    Comment: MICROSCOPIC NOT DONE ON URINES WITH NEGATIVE PROTEIN, BLOOD, LEUKOCYTES, NITRITE, OR GLUCOSE <1000 mg/dL.  Urine rapid drug screen (hosp performed)     Status: Abnormal   Collection Time: 07/28/14  3:37 PM  Result Value Ref Range   Opiates NONE DETECTED NONE DETECTED   Cocaine POSITIVE (A) NONE DETECTED   Benzodiazepines NONE DETECTED NONE DETECTED   Amphetamines NONE DETECTED NONE DETECTED   Tetrahydrocannabinol NONE DETECTED NONE DETECTED   Barbiturates NONE DETECTED NONE DETECTED    Comment:        DRUG SCREEN FOR MEDICAL PURPOSES ONLY.  IF CONFIRMATION IS NEEDED FOR ANY PURPOSE, NOTIFY LAB WITHIN 5 DAYS.        LOWEST DETECTABLE LIMITS FOR URINE DRUG SCREEN Drug Class       Cutoff (ng/mL) Amphetamine      1000 Barbiturate      200 Benzodiazepine   200 Tricyclics       300 Opiates          300 Cocaine          300 THC              50     Physical Findings: AIMS: Facial and Oral Movements Muscles of Facial Expression: None, normal Lips and Perioral Area: None, normal Jaw: None,  normal Tongue: None, normal,Extremity Movements Upper (arms, wrists, hands, fingers): None, normal Lower (legs, knees, ankles, toes): None, normal, Trunk Movements Neck, shoulders, hips: None, normal, Overall Severity Severity of abnormal movements (highest score from questions above): None, normal Incapacitation due to abnormal movements: None, normal Patient's awareness of abnormal movements (rate only patient's report): No Awareness, Dental Status Current problems with teeth and/or dentures?: No Does patient usually wear dentures?: No  CIWA:  CIWA-Ar Total: 0 COWS:     Treatment Plan Summary: Daily contact with patient to assess and evaluate symptoms and progress in treatment and Medication management: 1. Continue crisis management, mood stabilization & relapse prevention.. 2. Continue current medication management to reduce current symptoms to base line and improve the  patient's overall level of functioning; Continue Librium detox protocols for alcohol detox, Initiate Risperdal 2 mg Q bedtime for mood control. 3. Treat health problems as indicated. 4. Develop treatment plan to enhance medication adeherance upon discharge and the need for  readmission. 5. Psycho-social education regarding relapse prevention and self care.  Medical Decision Making:  Established Problem, Stable/Improving (1), Review of Psycho-Social Stressors (1), Review of Last Therapy Session (1), Review of Medication Regimen & Side Effects (2) and Review of New Medication or Change in Dosage (2)  Sanjuana Kava, PMHNP-BC 07/30/2014, 3:25 PM  I personally evaluated the patient. He has benefited from having the Librium detox protocol. He is still not sure how his wife is going to respond to this relapse. He is still upset about what happened with the job he was promised and did not happen. Admits that this would not  justify what he did Madie Reno A. Dub Mikes, M.D.

## 2014-07-30 NOTE — Progress Notes (Signed)
Did not attend group 

## 2014-07-30 NOTE — Progress Notes (Signed)
D: Pt is alert and oriented. Pt's mood and affect is irriable and blunted. Pt's eye contact is fair. Pt denies SI/HI and AVH at this time. Pt rates his depression 5/10, hopelessness and anxiety 6/10. Pt reports his goal for the day is "figuring out what next." Pt is attending groups. Pt expressing some desire to go to inpatient substance abuse treatment, pt requesting information, pt reports "I'd like a 14 day treatment". Pt denies having any withdrawal symptoms today.  A: Encouragement/Support provided to pt. Active listening by RN. Substance abuse treatment facilities informational hand out given to pt. Pt encouraged to speak with social worker about treatment desires. Scheduled medications administered per providers orders (See MAR). 15 minute checks continued per protocol for patient safety.  R: Patient cooperative and receptive to nursing interventions with encouragement. Pt remains safe.

## 2014-07-30 NOTE — Plan of Care (Signed)
Problem: Alteration in mood & ability to function due to Goal: LTG-Pt reports reduction in suicidal thoughts (Patient reports reduction in suicidal thoughts and is able to verbalize a safety plan for whenever patient is feeling suicidal)  Outcome: Progressing Patient denies having any suicidal thoughts today. Goal: STG-Patient will attend groups Outcome: Progressing Patient is attending unit groups today.  Problem: Ineffective individual coping Goal: STG: Patient will remain free from self harm Outcome: Progressing Patient remains free from self harm. 15 minute checks continued per protocol for patient safety.   Problem: Diagnosis: Increased Risk For Suicide Attempt Goal: LTG-Patient Will Report Absence of Withdrawal Symptoms LTG (by discharge): Patient will report absence of withdrawal symptoms.  Outcome: Progressing Patient denies having any withdrawal symptoms today.

## 2014-07-30 NOTE — [facility] Group Notes (Signed)
[facility] Group Notes:  (Nursing/MHT/Case Management/Adjunct)  Date:  07/30/2014  Time:  1015am  Type of Therapy:  Self Inventory RN Group  Participation Level:  Minimal  Participation Quality:  Resistant  Affect:  Blunted and Depressed  Cognitive:  Appropriate  Insight:  Lacking  Engagement in Group:  Engaged  Modes of Intervention:  Discussion, Education and Support  Summary of Progress/Problems: Patient attended group late but did participate. Pt minimally responds when prompted. [facility] "[facility].. Attempted suicide too." Pt reports "all I need is time" when asked if he has any additional concerns.  Lendell CapriceGuthrie, Yovany Clock A 07/30/2014, 11:12 AM

## 2014-07-30 NOTE — B[facility] Counselor (Signed)
Adult Comprehensive Assessment  Patient ID: Corky SingDavid Ernandez, male   DOB: 09/10/1965, 49 y.o.   MRN: [id number]  Information Source: Information source: Patient  Current Stressors:  Employment / Job issues: recently loss his job, trigger for relapsing Family Relationships: strained, worried about how they will feel about this relapse and hospitalization Financial / Lack of resources (include bankruptcy): finances are tight and now unemployed, wanting to get back to work ASAP Substance abuse: recent relapse on alcohol and cocaine  Living/Environment/Situation:  Living Arrangements: Spouse/significant other Living conditions (as described by patient or guardian): [place] in [place] with [place].  Pt reports this is a good environment.  How long has patient lived in current situation?: 2 years What is atmosphere in current home: Supportive, Loving, Comfortable  Family History:  Marital status: Married Number of Years Married: 2 What types of issues is patient dealing with in the relationship?: Pt reports having a good marriage but wife is tired of pt's substance abuse Additional relationship information: N/A Does patient have children?: Yes How many children?: 3 How is patient's relationship with their children?: adult children - reports being close to them  Childhood History:  By whom was/is the patient raised?: Both parents Additional childhood history information: Pt describes his childhood as "it sucked".  Pt states that his father was a Haematologistwomanizer and alcoholic.  Upset parent divorced after 34 years of marriage.   Description of patient's relationship with caregiver when they were a child: Pt reports being close to his parents growing up for the most part.  Patient's description of current relationship with people who raised him/her: Pt reports strained relationship with family now due to his history of substance use Does patient have siblings?: Yes Number of Siblings:  8 Description of patient's current relationship with siblings: 6 deceased, one living twin brother he is very close to, 1 living sister Did patient suffer any verbal/emotional/physical/sexual abuse as a child?: No Did patient suffer from severe childhood neglect?: No Has patient ever been sexually abused/assaulted/raped as an adolescent or adult?: No Was the patient ever a victim of a crime or a disaster?: No Witnessed domestic violence?: No Has patient been effected by domestic violence as an adult?: No  Education:  Highest grade of school patient has completed: graduated high school Currently a Consulting civil engineerstudent?: No Name of school: N/A Learning disability?: No  Employment/Work Situation:   Employment situation: Unemployed Patient's job has been impacted by current illness: No What is the longest time patient has a held a job?: "years" 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  Was clean and sober for 4 months prior.   If attempted suicide, did drugs/alcohol play a role in this?: No Alcohol/Substance Abuse Treatment Hx: Past Tx, Inpatient, Attends AA/NA If yes, describe treatment: [facility] - 2014 Has alcohol/substance abuse ever caused legal problems?: No  Social Support System:   Patient's Community Support System: Good Describe Community Support System: Pt reports his family is very supportive Type of faith/religion: Christian How does patient's faith help to cope with current illness?: church attendance, prayer  Leisure/Recreation:   Leisure and Hobbies:  watching TV  Strengths/Needs:   What things does the patient do well?: painting In what areas does patient struggle / problems for patient: Depression, substance use  Discharge Plan:   Does patient have access to transportation?: No Plan for no access to transportation at discharge: no driver's license, dependent on wife/others Will patient be returning to same living situation after discharge?: Yes Currently receiving community mental health services: No If no, would patient like referral for services when discharged?: Yes (What county?) [facility] [place]) Does patient have financial barriers related to discharge medications?: No  Summary/Recommendations:     Patient is a 49 year old African American Male with a diagnosis of Alcohol Use Disorder, Cocaine Use Disorder, Major Depressive Disorder.  Patient lives in [place] with his wife.  Pt states that he was at [facility] and did well for 4 months, but relapsed 1/28-1/29 after being let go from his job.  Pt states that he now worries about finances and finding a new job, and knows he didn't handle the stress in the best way.  Patient will benefit from crisis stabilization, medication evaluation, group therapy and psycho education in addition to case management for discharge planning. Discharge Process and Patient Expectations information sheet signed by patient, witnessed by writer and inserted in patient's shadow chart.    Horton, Salome Arnt. 07/30/2014

## 2014-07-31 MED ORDER — TRAZODONE HCL 50 MG PO TABS
50.0000 mg | ORAL_TABLET | Freq: Every evening | ORAL | Status: DC | PRN
  Administered 2014-07-31: 50 mg via ORAL
  Filled 2014-07-31 (×5): qty 1

## 2014-07-31 MED ORDER — BENZOCAINE 10 % MT GEL
Freq: Four times a day (QID) | OROMUCOSAL | Status: DC | PRN
  Administered 2014-07-31 – 2014-08-02 (×3): via OROMUCOSAL
  Filled 2014-07-31: qty 9.4

## 2014-07-31 NOTE — Tx Team (Signed)
Interdisciplinary Treatment Plan Update (Adult)   Date: 07/31/2014  Time Reviewed: 11:18 AM  Progress in Treatment:  Attending groups: Yes  Participating in groups:  Minimally  Taking medication as prescribed: Yes  Tolerating medication: Yes  Family/Significant othe contact made: Not yet. SPE required for this pt.  Patient understands diagnosis: Yes, AEB seeking treatment for Etoh detox/cocaine abuse, depression, SI, and med stabilization.  Discussing patient identified problems/goals with staff: Yes  Medical problems stabilized or resolved: Yes  Denies suicidal/homicidal ideation: Yes during group.  Patient has not harmed self or Others: Yes  New problem(s) identified:  Discharge Plan or Barriers: Pt hoping for shorterm i/p treatment or outpatient treatment. CSW assessing. Pt has [employer] insurance.  Additional comments: [name] is 49 years old, an African-American male, [facility]. He reports, "My wife took me to the hospital last night. I had started using drugs & alcohol again. I relapsed 2 days ago. I got very stressed, became very anxious I relapsed. I guess I did not deal with things the way that I should. I'm struggling with life in general. I was sober x 4 months before relapsing 2 days ago. I was just getting out of the [facility] for drug & alcohol abuse rehab. I was trying to overdose on drugs".  Reason for Continuation of Hospitalization: Librium taper-withdrawals Mood stabilization Medication management Estimated length of stay: 3-5 days  For review of initial/current patient goals, please see plan of care.  Attendees:  Patient:    Family:    Physician: Geoffery LyonsIrving Lugo MD 07/31/2014 11:20 AM   Nursing: Samule Dryonecia, Patrice, Foy GuadalajaraChrista RN 07/31/2014 11:20 AM   Clinical Social Worker Vandy Fong Smart, LCSWA  07/31/2014 11:20 AM   Other: Earley AbideKristin D. LCSWA  07/31/2014 11:20 AM   Other: Vikki PortsValerie; [facility]  07/31/2014 11:20 AM   Other: Liliane Badeolora Sutton, Community Care  Coordinator  07/31/2014 11:20 AM   Other:    Scribe for Treatment Team:  The Sherwin-WilliamsHeather Smart LCSWA 07/31/2014 11:21 AM

## 2014-07-31 NOTE — [facility] Group Notes (Signed)
Adult Psychoeducational Group Note  Date:  07/31/2014 Time:  10:57 PM  Group Topic/Focus:  Wrap-Up Group:   The focus of this group is to help patients review their daily goal of treatment and discuss progress on daily workbooks.  Participation Level:  Active  Participation Quality:  Appropriate  Affect:  Appropriate  Cognitive:  Appropriate  Insight: Appropriate  Engagement in Group:  Engaged  Modes of Intervention:  Discussion  Additional Comments:  [name] stated that he doesn't have bad days.  He expressed that his wife came to visit, which was unusual to him because she "never comes to see me, she just drops my stuff off and leave".  He also expressed that she seemed distant from him.  His goal was to begin to fix the things he messed up.  Caroll RancherLindsay, Amna Welker A 07/31/2014, 10:57 PM

## 2014-07-31 NOTE — Progress Notes (Signed)
[facility] Group Notes:  (Nursing/MHT/Case Management/Adjunct)  Date:  07/31/2014  Time:  12:51 AM  Type of Therapy:  Group Therapy  Participation Level:  Minimal  Participation Quality:  Appropriate  Affect:  Appropriate, Depressed and Flat  Cognitive:  Appropriate  Insight:  Lacking  Engagement in Group:  Developing/Improving  Modes of Intervention:  Socialization and Support  Summary of Progress/Problems: Pt. Participation in group discussion was minimal.  Pt. Stated he "felt like a lone man on a island."  Pt. Stated he did not have much of a support system.  Sondra ComeWilson, Louella Medaglia J 07/31/2014, 12:51 AM

## 2014-07-31 NOTE — [facility] Group Notes (Signed)
[facility]   07/31/2014 11:10 AM  Participation Quality:  Minimal  Mood/Affect:  Irritable  Depression Rating:  "not really"  Anxiety Rating:  "alittle"-issues with my wife  Thoughts of Suicide:  No Will you contract for safety?   NA  Current AVH:  No  Plan for Discharge/Comments:  Pt reports that he came to the hospital for ETOH detox, and sbustance abuse. Pt irritable and provided minimal information during group. Pt states that his wife is tired of him and does not know if she is supportive of him at this point. Pt reports feeling tired, no other w/d sx reported. He wants 14 day i/p treatment for o/p treatment-"I have to get back to work soon." Pt reports recently [facility] and relapsing soon after d/c.   Transportation Means: family member  Supports: some family/issues with wife currently   Smart, OncologistHeather LCSWA

## 2014-07-31 NOTE — Progress Notes (Signed)
[name] MD Progress Note  07/31/2014 6:36 PM Samuel Atkinson  MRN:  [id number] Subjective:  States that he is owning to the fact that he messed up when he used. States that his wife is still upset with him but that he thinks she will eventually get over it and they are going to be together. He is still upset with the employer who did not give him the job that he offered to him. 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 He is committed to abstinence Principal Problem: <principal problem not specified> Diagnosis:   Patient Active Problem List   Diagnosis Date Noted  . Polysubstance abuse [F19.10]   . Suicidal ideation [R45.851]   . MDD (major depressive disorder) [F32.2] 03/27/2014  . 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 Cocaine abuse [F14.10] 03/26/2014  . Depression [F32.9] 03/26/2014  . Seizures [R56.9] 02/09/2013  . Cerebral AVM [Q28.2] 02/09/2013   Total Time spent with patient: 30 minutes   Past Medical History:  Past Medical History  Diagnosis Date  . Hypertension   . Seizure disorder   . Hx of substance abuse   . AVM (arteriovenous malformation)   . Seizures     Past Surgical History  Procedure Laterality Date  . Avm embolization  2013   Family History:  Family History  Problem Relation Age of Onset  . Hypertension Father   . Coronary artery disease Father   . Diabetes Mother   . Hypertension Mother   . Coronary artery disease Brother   . Healthy Brother   . Coronary artery disease Brother    Social History:  History  Alcohol Use  . Yes    Comment: Unsure of amount / pt. states he drank $150 worth ETOH in 2 days     History  Drug Use  . Yes  . Special: Cocaine    Comment: Unsure of amount /states $700 worth in 2 days    History   Social History  . Marital Status: Single    Spouse Name: Samuel Atkinson    Number of Children: 3  . Years of  Education: GED    Occupational History  . Unemployed    Social History Main Topics  . Smoking status: Current Every Day Smoker -- 0.50 packs/day    Types: Cigarettes  . Smokeless tobacco: Never Used  . Alcohol Use: Yes     Comment: Unsure of amount / pt. states he drank $150 worth ETOH in 2 days  . Drug Use: Yes    Special: Cocaine     Comment: Unsure of amount /states $700 worth in 2 days  . Sexual Activity: Yes    Birth Control/ Protection: None   Other Topics Concern  . None   Social History Narrative   Patient lives at home with Samuel Atkinson). Patient works full time time.   Education 12 th grade.   Caffeine two cups of coffee daily. Three mountain dews daily.   Right handed.   Additional History:    Sleep: Fair  Appetite:  Fair   Assessment:   Musculoskeletal: Strength & Muscle Tone: within normal limits Gait & Station: normal Patient leans: N/A   Psychiatric Specialty Exam: Physical Exam  Review of Systems  Constitutional: Negative.   HENT: Negative.   Eyes: Negative.   Respiratory: Negative.   Cardiovascular: Negative.   Gastrointestinal: Negative.   Genitourinary:  Negative.   Musculoskeletal: Negative.   Skin: Negative.   Neurological: Negative.   Endo/Heme/Allergies: Negative.   Psychiatric/Behavioral: Positive for depression and substance abuse. The patient is nervous/anxious.     Blood pressure 110/58, pulse 69, temperature 97.8 F (36.6 C), temperature source Oral, resp. rate 20, height  (1.778 m), weight 73.936 kg (163 lb).Body mass index is 23.39 kg/(m^2).  General Appearance: Fairly Groomed  Patent attorney::  Fair  Speech:  Clear and Coherent  Volume:  Normal  Mood:  Anxious and worried  Affect:  anxious worried  Thought Process:  Coherent and Goal Directed  Orientation:  Full (Time, Place, and Person)  Thought Content:  symptoms event worries concerns  Suicidal Thoughts:  No  Homicidal Thoughts:  No  Memory:  Immediate;    Fair Recent;   Fair Remote;   Fair  Judgement:  Fair  Insight:  Present  Psychomotor Activity:  Restlessness  Concentration:  Fair  Recall:  [employer] of Knowledge:Fair  Language: Fair  Akathisia:  No  Handed:  Right  AIMS (if indicated):     Assets:  Desire for Improvement Housing  ADL's:  Intact  Cognition: WNL  Sleep:  Number of Hours: 6.75     Current Medications: Current Facility-Administered Medications  Medication Dose Route Frequency Provider Last Rate Last Dose  . acetaminophen (TYLENOL) tablet 650 mg  650 mg Oral Q6H PRN Audrea Muscat, NP   650 mg at 07/31/14 1614  . alum & mag hydroxide-simeth (MAALOX/MYLANTA) 200-200-20 MG/5ML suspension 30 mL  30 mL Oral Q4H PRN Evanna Janann August, NP      . benzocaine (ORAJEL) 10 % mucosal gel   Mouth/Throat QID PRN Rachael Fee, MD      . chlordiazePOXIDE (LIBRIUM) capsule 25 mg  25 mg Oral Q6H PRN Evanna Janann August, NP      . chlordiazePOXIDE (LIBRIUM) capsule 25 mg  25 mg Oral BH-qamhs Evanna Janann August, NP       Followed by  . [START ON 08/02/2014] chlordiazePOXIDE (LIBRIUM) capsule 25 mg  25 mg Oral Daily Evanna Cori Merry Proud, NP      . hydrOXYzine (ATARAX/VISTARIL) tablet 25 mg  25 mg Oral Q6H PRN Audrea Muscat, NP   25 mg at 07/30/14 2052  . loperamide (IMODIUM) capsule 2-4 mg  2-4 mg Oral PRN Evanna Janann August, NP      . magnesium hydroxide (MILK OF MAGNESIA) suspension 30 mL  30 mL Oral Daily PRN Evanna Janann August, NP      . multivitamin with minerals tablet 1 tablet  1 tablet Oral Daily Evanna Janann August, NP   1 tablet at 07/31/14 0815  . ondansetron (ZOFRAN-ODT) disintegrating tablet 4 mg  4 mg Oral Q6H PRN Audrea Muscat, NP      . risperiDONE (RISPERDAL) tablet 2 mg  2 mg Oral QHS Sanjuana Kava, NP   2 mg at 07/30/14 2121  . thiamine (VITAMIN B-1) tablet 100 mg  100 mg Oral Daily Evanna Janann August, NP   100 mg at 07/31/14 1610    Lab Results: No results found for this or any previous  visit (from the past 48 hour(s)).  Physical Findings: AIMS: Facial and Oral Movements Muscles of Facial Expression: None, normal Lips and Perioral Area: None, normal Jaw: None, normal Tongue: None, normal,Extremity Movements Upper (arms, wrists, hands, fingers): None, normal Lower (legs, knees, ankles, toes): None, normal, Trunk Movements Neck, shoulders, hips: None, normal, Overall Severity Severity  of abnormal movements (highest score from questions above): None, normal Incapacitation due to abnormal movements: None, normal Patient's awareness of abnormal movements (rate only patient's report): No Awareness, Dental Status Current problems with teeth and/or dentures?: No Does patient usually wear dentures?: No  CIWA:  CIWA-Ar Total: 1 COWS:     Treatment Plan Summary: Daily contact with patient to assess and evaluate symptoms and progress in treatment and Medication management Cocaine, Alcohol Abuse; pursue the detox create a relapse prevention plan Mood instability; continue to work with the Risperdal that he has tolerated well so far Explore a CD IOP as he does not want to pursue another residential treatment program Medical Decision Making:  Review of Psycho-Social Stressors (1), Independent Review of image, tracing or specimen (2) and Review of Medication Regimen & Side Effects (2)     Dehlia Kilner A 07/31/2014, 6:36 PM

## 2014-07-31 NOTE — Progress Notes (Signed)
D: Pt has blunted affect and anxious mood.  Pt reports his day has been "okay."  Pt reports he feels "foggy."  Pt reports he is "always anxious."  Pt reports he hopes to attend a 14 day program [facility].  He states "anything but [facility]."  Pt interacts appropriately with staff and peers.  He is visible in the milieu and laughs/smiles occasionally.  Denies SI/HI, denies hallucinations.   A: Safety maintained.  Encouraged and supported pt.  Actively listened to pt.  PRN medication administered for pain and anxiety, see flowsheet.  Medications administered per order.   R: Pt is compliant with medication.  He is interacting with staff and peers more.  He verbally contracts for safety.  Will continue to monitor and assess for safety.

## 2014-07-31 NOTE — Plan of Care (Signed)
Problem: Diagnosis: Increased Risk For Suicide Attempt Goal: STG-Patient Will Comply With Medication Regime Outcome: Progressing Pt has been compliant with all medications this shift.       

## 2014-07-31 NOTE — [facility] Group Notes (Signed)
[facility] LCSW Group Therapy  07/31/2014 4:10 PM  Type of Therapy:  Group Therapy  Participation Level:  Active  Participation Quality:  Attentive  Affect:  Depressed  Cognitive:  Alert and Oriented  Insight:  Improving  Engagement in Therapy:  Engaged  Modes of Intervention:  Confrontation, Discussion, Education, Exploration, Problem-solving, Rapport Building, Socialization and Support  Summary of Progress/Problems: Today's Topic: Overcoming Obstacles. Pt identified obstacles faced currently and processed barriers involved in overcoming these obstacles. Pt identified steps necessary for overcoming these obstacles and explored motivation (internal and external) for facing these difficulties head on. Pt further identified one area of concern in their lives and chose a skill of focus pulled from their "toolbox." Samuel Atkinson was attentive and engaged during today's processing group. He shared that his biggest obstacle is "getting over my stupid decision and forgiving myself." Samuel Atkinson shared that he made a "dumb decision that I knew was wrong" and ended up relapsing, losing his job, and nearly losing his wife. Samuel Huaavid participated in group discussion regarding the triggers associated with alcoholism and obstacles involved with people, places, and things regarding addiction. He continues to show progress in the group setting with improving insight.    Samuel Atkinson, Samuel Atkinson LCSWA  07/31/2014, 4:10 PM

## 2014-07-31 NOTE — Progress Notes (Signed)
D: Patient's affect appropriate to circumstance and mood is anxious. Declined completing the self inventory sheet today. He verbalized that he has been having racing thoughts and anxiety. Patient attending group sessions throughout the day and interactive with peers in the dayroom. In adherence with all medications and tolerating them well.  A: Support and encouragement provided to patient. Scheduled medications administered per MD orders. Monitor Q15 minute checks for safety.  R: Patient receptive. Denies SI/HI and AVH. Patient remains safe on the unit.

## 2014-08-01 MED ORDER — LEVETIRACETAM ER 500 MG PO TB24
1500.0000 mg | ORAL_TABLET | Freq: Every day | ORAL | Status: DC
  Administered 2014-08-01 – 2014-08-02 (×2): 1500 mg via ORAL
  Filled 2014-08-01 (×4): qty 3

## 2014-08-01 MED ORDER — AMLODIPINE BESYLATE 10 MG PO TABS
10.0000 mg | ORAL_TABLET | Freq: Every day | ORAL | Status: DC
  Administered 2014-08-01 – 2014-08-03 (×3): 10 mg via ORAL
  Filled 2014-08-01 (×2): qty 1
  Filled 2014-08-01: qty 2
  Filled 2014-08-01 (×2): qty 1

## 2014-08-01 MED ORDER — MIRTAZAPINE 30 MG PO TABS
30.0000 mg | ORAL_TABLET | Freq: Every day | ORAL | Status: DC
  Administered 2014-08-01 – 2014-08-02 (×2): 30 mg via ORAL
  Filled 2014-08-01 (×3): qty 1

## 2014-08-01 MED ORDER — AMLODIPINE BESYLATE 10 MG PO TABS
10.0000 mg | ORAL_TABLET | Freq: Every day | ORAL | Status: DC
  Filled 2014-08-01: qty 1

## 2014-08-01 NOTE — Progress Notes (Signed)
D: Pt presents with sad affect and appears depressed. Pt rates depression 5/10. Pt stated that it's normal for him to feel depressed at 5/10. Pt denies having any withdrawal symptoms. No complaints verbalized by pt at this time. Pt compliant with treatment.  A: Medications administered as ordered per MD. Verbal support given. Pt encouraged to attend groups. 15 minute checks performed for safety.  R: Pt receptive to treatment.

## 2014-08-01 NOTE — [facility] Group Notes (Signed)
Adult Psychoeducational Group Note  Date:  08/01/2014 Time:  10:30 PM  Group Topic/Focus:  Wrap-Up Group:   The focus of this group is to help patients review their daily goal of treatment and discuss progress on daily workbooks.  Participation Level:  Active  Participation Quality:  Appropriate  Affect:  Appropriate  Cognitive:  Appropriate  Insight: Appropriate  Engagement in Group:  Engaged  Modes of Intervention:  Discussion  Additional Comments:  [name] said his day was all right.  He stated he has been having dizzy spells and that "this has been an experience being around people with the same struggles and learning from them".  Caroll RancherLindsay, Theta Leaf A 08/01/2014, 10:30 PM

## 2014-08-01 NOTE — Progress Notes (Signed)
[facility] MD Progress Note  08/01/2014 4:55 PM Samuel Atkinson  MRN:  [id number] Subjective:  Samuel Atkinson is still trying to get things right with his wife. States that he thinks she is coming along. Understands that there are going to be trust issues with her and that he needs to be ready for this. He is wanting to get a job as soon as he gets out of here. 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 Still very resentful of the way he was treated by the guy he worked for and that he thought would become a permanent job Principal Problem: <principal problem not specified> Diagnosis:   Patient Active Problem List   Diagnosis Date Noted  . Polysubstance abuse [F19.10]   . Suicidal ideation [R45.851]   . MDD (major depressive disorder) [F32.2] 03/27/2014  . 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 Cocaine abuse [F14.10] 03/26/2014  . Depression [F32.9] 03/26/2014  . Seizures [R56.9] 02/09/2013  . Cerebral AVM [Q28.2] 02/09/2013   Total Time spent with patient: 30 minutes   Past Medical History:  Past Medical History  Diagnosis Date  . Hypertension   . Seizure disorder   . Hx of substance abuse   . AVM (arteriovenous malformation)   . Seizures     Past Surgical History  Procedure Laterality Date  . Avm embolization  2013   Family History:  Family History  Problem Relation Age of Onset  . Hypertension Father   . Coronary artery disease Father   . Diabetes Mother   . Hypertension Mother   . Coronary artery disease Brother   . Healthy Brother   . Coronary artery disease Brother    Social History:  History  Alcohol Use  . Yes    Comment: Unsure of amount / pt. states he drank $150 worth ETOH in 2 days     History  Drug Use  . Yes  . Special: Cocaine    Comment: Unsure of amount /states $700 worth in 2 days    History   Social History  . Marital Status: Single    Spouse Name: Samuel Atkinson    Number of Children: 3   . Years of Education: GED    Occupational History  . Unemployed    Social History Main Topics  . Smoking status: Current Every Day Smoker -- 0.50 packs/day    Types: Cigarettes  . Smokeless tobacco: Never Used  . Alcohol Use: Yes     Comment: Unsure of amount / pt. states he drank $150 worth ETOH in 2 days  . Drug Use: Yes    Special: Cocaine     Comment: Unsure of amount /states $700 worth in 2 days  . Sexual Activity: Yes    Birth Control/ Protection: None   Other Topics Concern  . None   Social History Narrative   Patient lives at home with Samuel Atkinson). Patient works full time time.   Education 12 th grade.   Caffeine two cups of coffee daily. Three mountain dews daily.   Right handed.   Additional History:    Sleep: Fair  Appetite:  Fair   Assessment:   Musculoskeletal: Strength & Muscle Tone: within normal limits Gait & Station: normal Patient leans: N/A   Psychiatric Specialty Exam: Physical Exam  Review of Systems  Constitutional: Negative.   HENT: Negative.   Eyes: Negative.   Respiratory: Negative.   Cardiovascular: Negative.   Gastrointestinal:  Negative.   Genitourinary: Negative.   Musculoskeletal: Negative.   Skin: Negative.   Neurological: Negative.   Endo/Heme/Allergies: Negative.   Psychiatric/Behavioral: Positive for depression and substance abuse.    Blood pressure 104/64, pulse 93, temperature 98 F (36.7 C), temperature source Oral, resp. rate 18, height  (1.778 m), weight 73.936 kg (163 lb).Body mass index is 23.39 kg/(m^2).  General Appearance: Fairly Groomed  Patent attorney::  Fair  Speech:  Clear and Coherent  Volume:  Normal  Mood:  Anxious and worried  Affect:  anxious worried  Thought Process:  Coherent and Goal Directed  Orientation:  Full (Time, Place, and Person)  Thought Content:  symptoms events worries concerns  Suicidal Thoughts:  No  Homicidal Thoughts:  No  Memory:  Immediate;   Fair Recent;   Fair Remote;    Fair  Judgement:  Fair  Insight:  Present  Psychomotor Activity:  Restlessness  Concentration:  Fair  Recall:  [employer] of Knowledge:Fair  Language: Fair  Akathisia:  No  Handed:  Right  AIMS (if indicated):     Assets:  Desire for Improvement Housing  ADL's:  Intact  Cognition: WNL  Sleep:  Number of Hours: 6.75     Current Medications: Current Facility-Administered Medications  Medication Dose Route Frequency Provider Last Rate Last Dose  . acetaminophen (TYLENOL) tablet 650 mg  650 mg Oral Q6H PRN Audrea Muscat, NP   650 mg at 07/31/14 1614  . alum & mag hydroxide-simeth (MAALOX/MYLANTA) 200-200-20 MG/5ML suspension 30 mL  30 mL Oral Q4H PRN Evanna Janann August, NP      . benzocaine (ORAJEL) 10 % mucosal gel   Mouth/Throat QID PRN Rachael Fee, MD      . Melene Muller ON 08/02/2014] chlordiazePOXIDE (LIBRIUM) capsule 25 mg  25 mg Oral Daily Evanna Cori Burkett, NP      . magnesium hydroxide (MILK OF MAGNESIA) suspension 30 mL  30 mL Oral Daily PRN Evanna Janann August, NP      . mirtazapine (REMERON) tablet 30 mg  30 mg Oral QHS Rachael Fee, MD      . multivitamin with minerals tablet 1 tablet  1 tablet Oral Daily Evanna Janann August, NP   1 tablet at 08/01/14 [phone]  . risperiDONE (RISPERDAL) tablet 2 mg  2 mg Oral QHS Sanjuana Kava, NP   2 mg at 07/31/14 2041  . thiamine (VITAMIN B-1) tablet 100 mg  100 mg Oral Daily Evanna Janann August, NP   100 mg at 08/01/14 9604    Lab Results: No results found for this or any previous visit (from the past 48 hour(s)).  Physical Findings: AIMS: Facial and Oral Movements Muscles of Facial Expression: None, normal Lips and Perioral Area: None, normal Jaw: None, normal Tongue: None, normal,Extremity Movements Upper (arms, wrists, hands, fingers): None, normal Lower (legs, knees, ankles, toes): None, normal, Trunk Movements Neck, shoulders, hips: None, normal, Overall Severity Severity of abnormal movements (highest score from  questions above): None, normal Incapacitation due to abnormal movements: None, normal Patient's awareness of abnormal movements (rate only patient's report): No Awareness, Dental Status Current problems with teeth and/or dentures?: No Does patient usually wear dentures?: No  CIWA:  CIWA-Ar Total: 0 COWS:     Treatment Plan Summary: Daily contact with patient to assess and evaluate symptoms and progress in treatment and Medication management Supportive approach/coping skills/relapse prevention Cocaine, alcohol abuse: continue work on a relapse prevention plan Mood Instability: work with the  Risperdal as well as CBT, mindfulness Insomnia continue to work with the Remeron  Medical Decision Making:  Review of Psycho-Social Stressors (1) and Review of Medication Regimen & Side Effects (2)     Lujean Ebright A 08/01/2014, 4:55 PM

## 2014-08-01 NOTE — Progress Notes (Signed)
D. Pt had been up and active in milieu this evening, did attend and participate in evening group activity. Pt spoke about difficulties with sleep as well as on-going pain in his teeth. Pt requested sleep aid to help with insomnia and was provided with trazodone. Pt spoke some of his situation before retiring to his room for the evening. A. Support and encouragement provided, medication education provided. R. Safety maintained, will continue to monitor.

## 2014-08-01 NOTE — [facility] Group Notes (Signed)
[facility] LCSW Group Therapy  08/01/2014 1:22 PM  Type of Therapy:  Group Therapy  Participation Level:  Active  Participation Quality:  Attentive  Affect:  Appropriate  Cognitive:  Alert and Oriented  Insight:  Improving  Engagement in Therapy:  Engaged  Modes of Intervention:  Discussion, Education, Exploration, Problem-solving, Socialization and Support  Summary of Progress/Problems: [facility] Speaker came to talk about his personal journey with substance abuse and addiction. The pt processed ways by which to relate to the speaker. [facility]HA speaker provided handouts and educational information pertaining to groups and services [facility].   Smart, Demetrus Pavao LCSWA 08/01/2014, 1:22 PM

## 2014-08-02 MED ORDER — DOCUSATE SODIUM 100 MG PO CAPS
100.0000 mg | ORAL_CAPSULE | Freq: Two times a day (BID) | ORAL | Status: DC
  Administered 2014-08-02 – 2014-08-03 (×2): 100 mg via ORAL
  Filled 2014-08-02 (×4): qty 1

## 2014-08-02 MED ORDER — MAGNESIUM CITRATE PO SOLN
1.0000 | Freq: Once | ORAL | Status: AC
  Administered 2014-08-02: 1 via ORAL

## 2014-08-02 NOTE — [facility] Group Notes (Signed)
[facility] LCSW Group Therapy  Emotional Regulation 1:15 - 2: 30 PM        08/02/2014  2:52 PM    Type of Therapy:  Group Therapy  Participation Level:  Appropriate  Participation Quality:  Appropriate  Affect:  Appropriate  Cognitive:  Attentive Appropriate  Insight:  Developing/Improving Engaged  Engagement in Therapy:  Developing/Improving Engaged  Modes of Intervention:  Discussion Exploration Problem-Solving Supportive  Summary of Progress/Problems:  Group topic was emotional regulations.  Patient participated in the discussion and was able to identify an emotion that needed to regulated.  He shared he is like another patient in being frustration with not being able to control much he drinks.  He also provided support to another patient and encouraged her to talk with her children about feeling she is a burden to them.  [name], Mysty Kielty Hairston 08/02/2014 2:52 PM

## 2014-08-02 NOTE — Progress Notes (Signed)
D. Pt had been up and active in milieu this evening, did attend and participate in evening group activity. Pt reports feeling better, does not display any overt signs or symptoms of withdrawal. Pt did speak some about on-going depression and spoke of working through it. Pt also spoke of having some difficulties with sleep and is hopeful change in medication will be beneficial. A. Support and encouragement provided. R. Safety maintained, will continue to monitor.

## 2014-08-02 NOTE — [facility] Suicide Risk Assessment (Signed)
[facility] INPATIENT:  Family/Significant Other Suicide Prevention Education  Suicide Prevention Education:  Education Completed; Samuel Atkinson, Wife, [phone]; has been identified by the patient as the family member/significant other with whom the patient will be residing, and identified as the person(s) who will aid the patient in the event of a mental health crisis (suicidal ideations/suicide attempt).  With written consent from the patient, the family member/significant other has been provided the following suicide prevention education, prior to the and/or following the discharge of the patient.  The suicide prevention education provided includes the following:  Suicide risk factors  Suicide prevention and interventions  National Suicide Hotline telephone number  [facility] assessment telephone number  [facility] Emergency Assistance 911  [facility] and/or [facility] Crisis Unit telephone number  Request made of family/significant other to:  Remove weapons (e.g., guns, rifles, knives), all items previously/currently identified as safety concern.  Wife advised patient does not have access to weapons.    Remove drugs/medications (over-the-counter, prescriptions, illicit drugs), all items previously/currently identified as a safety concern.  The family member/significant other verbalizes understanding of the suicide prevention education information provided.  The family member/significant other agrees to remove the items of safety concern listed above.  Samuel Atkinson 08/02/2014, 4:17 PM

## 2014-08-02 NOTE — Progress Notes (Signed)
[facility]  08/02/2014 4:49 PM Samuel Atkinson  MRN:  [id number] Subjective:  Samuel Atkinson states that he has continued to talk to his wife and they have continued to address the issues that create conflict between them. He states he has owned to the fact he should not have reacted the way he did what led to the relapse. He states he could have done something different like call the people he is closed too. States that one of the reasons he did not is that these people look up to him. He states it was probably pride. But states that this would never happen again, but if he got to this point again he will know what to do. He is grateful that his wife is still wanting to be with him. He states that she suggested that he keep more of his income and not give her 70% for household expenses Principal Problem: <principal problem not specified> Diagnosis:   Patient Active Problem List   Diagnosis Date Noted  . Polysubstance abuse [F19.10]   . Suicidal ideation [R45.851]   . MDD (major depressive disorder) [F32.2] 03/27/2014  . 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 Cocaine abuse [F14.10] 03/26/2014  . Depression [F32.9] 03/26/2014  . Seizures [R56.9] 02/09/2013  . Cerebral AVM [Q28.2] 02/09/2013   Total Time spent with patient: 30 minutes   Past Medical History:  Past Medical History  Diagnosis Date  . Hypertension   . Seizure disorder   . Hx of substance abuse   . AVM (arteriovenous malformation)   . Seizures     Past Surgical History  Procedure Laterality Date  . Avm embolization  2013   Family History:  Family History  Problem Relation Age of Onset  . Hypertension Father   . Coronary artery disease Father   . Diabetes Mother   . Hypertension Mother   . Coronary artery disease Brother   . Healthy Brother   . Coronary artery disease Brother    Social History:  History  Alcohol Use  . Yes    Comment: Unsure of amount / pt. states he drank $150  worth ETOH in 2 days     History  Drug Use  . Yes  . Special: Cocaine    Comment: Unsure of amount /states $700 worth in 2 days    History   Social History  . Marital Status: Single    Spouse Name: Samuel Atkinson    Number of Children: 3  . Years of Education: GED    Occupational History  . Unemployed    Social History Main Topics  . Smoking status: Current Every Day Smoker -- 0.50 packs/day    Types: Cigarettes  . Smokeless tobacco: Never Used  . Alcohol Use: Yes     Comment: Unsure of amount / pt. states he drank $150 worth ETOH in 2 days  . Drug Use: Yes    Special: Cocaine     Comment: Unsure of amount /states $700 worth in 2 days  . Sexual Activity: Yes    Birth Control/ Protection: None   Other Topics Concern  . None   Social History Narrative   Patient lives at home with Samuel Atkinson(Samuel Atkinson). Patient works full time time.   Education 12 th grade.   Caffeine two cups of coffee daily. Three mountain dews daily.   Right handed.   Additional History:    Sleep: Fair  Appetite:  Fair   Assessment:   Musculoskeletal: Strength &  Muscle Tone: within normal limits Gait & Station: normal Patient leans: N/A   Psychiatric Specialty Exam: Physical Exam  Review of Systems  Constitutional: Negative.   HENT: Negative.   Eyes: Negative.   Respiratory: Negative.   Cardiovascular: Negative.   Gastrointestinal: Positive for constipation.  Genitourinary: Negative.   Musculoskeletal: Negative.   Skin: Negative.   Neurological: Negative.   Endo/Heme/Allergies: Negative.   Psychiatric/Behavioral: Positive for substance abuse. The patient is nervous/anxious.     Blood pressure 109/67, pulse 85, temperature 97.8 F (36.6 C), temperature source Oral, resp. rate 18, height  (1.778 m), weight 73.936 kg (163 lb).Body mass index is 23.39 kg/(m^2).  General Appearance: Fairly Groomed  Patent attorney::  Fair  Speech:  Clear and Coherent  Volume:  Normal  Mood:  Anxious  Affect:   anxious worried  Thought Process:  Coherent and Goal Directed  Orientation:  Full (Time, Place, and Person)  Thought Content:  symptoms events worries concerns  Suicidal Thoughts:  No  Homicidal Thoughts:  No  Memory:  Immediate;   Fair Recent;   Fair Remote;   Fair  Judgement:  Fair  Insight:  Present  Psychomotor Activity:  Restlessness  Concentration:  Fair  Recall:  [employer] of Knowledge:Fair  Language: Fair  Akathisia:  No  Handed:  Right  AIMS (if indicated):     Assets:  Desire for Improvement Housing Social Support  ADL's:  Intact  Cognition: WNL  Sleep:  Number of Hours: 6.25     Current Medications: Current Facility-Administered Medications  Medication Dose Route Frequency Provider Last Rate Last Dose  . acetaminophen (TYLENOL) tablet 650 mg  650 mg Oral Q6H PRN Audrea Muscat, NP   650 mg at 07/31/14 1614  . alum & mag hydroxide-simeth (MAALOX/MYLANTA) 200-200-20 MG/5ML suspension 30 mL  30 mL Oral Q4H PRN Evanna Janann August, NP      . amLODipine (NORVASC) tablet 10 mg  10 mg Oral Daily Kerry Hough, PA-C   10 mg at 08/02/14 1610  . benzocaine (ORAJEL) 10 % mucosal gel   Mouth/Throat QID PRN Rachael Fee, MD      . docusate sodium (COLACE) capsule 100 mg  100 mg Oral BID Rachael Fee, MD      . levETIRAcetam (KEPPRA XR) 24 hr tablet 1,500 mg  1,500 mg Oral QHS Kerry Hough, PA-C   1,500 mg at 08/01/14 2154  . magnesium hydroxide (MILK OF MAGNESIA) suspension 30 mL  30 mL Oral Daily PRN Evanna Janann August, NP      . mirtazapine (REMERON) tablet 30 mg  30 mg Oral QHS Rachael Fee, MD   30 mg at 08/01/14 2154  . multivitamin with minerals tablet 1 tablet  1 tablet Oral Daily Evanna Janann August, NP   1 tablet at 08/02/14 [phone]  . risperiDONE (RISPERDAL) tablet 2 mg  2 mg Oral QHS Sanjuana Kava, NP   2 mg at 08/01/14 2154  . thiamine (VITAMIN B-1) tablet 100 mg  100 mg Oral Daily Evanna Janann August, NP   100 mg at 08/02/14 5409    Lab Results: No  results found for this or any previous visit (from the past 48 hour(s)).  Physical Findings: AIMS: Facial and Oral Movements Muscles of Facial Expression: None, normal Lips and Perioral Area: None, normal Jaw: None, normal Tongue: None, normal,Extremity Movements Upper (arms, wrists, hands, fingers): None, normal Lower (legs, knees, ankles, toes): None, normal, Trunk Movements Neck,  shoulders, hips: None, normal, Overall Severity Severity of abnormal movements (highest score from questions above): None, normal Incapacitation due to abnormal movements: None, normal Patient's awareness of abnormal movements (rate only patient's report): No Awareness, Dental Status Current problems with teeth and/or dentures?: No Does patient usually wear dentures?: No  CIWA:  CIWA-Ar Total: 0 COWS:     Treatment Plan Summary: Daily contact with patient to assess and evaluate symptoms and progress in treatment and Medication management Alcohol/cocaine abuse: complete detox, address the cravings/develop a relapse prevention plan Mood instability: continue the Risperdal Insomnia; continue Remeron  Constipation: Mag Citrate and colace Medical Decision Making:  Review of Psycho-Social Stressors (1), Review of Medication Regimen & Side Effects (2) and Review of New Medication or Change in Dosage (2)     Malaya Cagley A 08/02/2014, 4:49 PM

## 2014-08-02 NOTE — Progress Notes (Signed)
D: Pt presents with flat affect and depressed mood. Pt noted to have minimal interaction this morning. Pt did not voice any concerns. Pt denies suicidal/homicidal thoughts. Pt reports depression 3/10. Pt reported sleeping well last night. Pt discharge plans are to return back home. Pt hygiene noted appropriate for pt. Pt appears well groomed for situation.  Pt compliant with taking meds and attending groups.  A: Medications administered as ordered per MD. Verbal support given. Pt encouraged to attend groups. 15 minute checks performed for safety.  R: Pt receptive to treatment.

## 2014-08-02 NOTE — Clinical Social Work Note (Signed)
CSW spoke with patient wife who wants someone to let patient know he is expected to take care of his end of responsibilities and go to meetings and attend meetings and get a sponsor.  Wife advised she has told patient if he does not do these things, he will have to leave the home.

## 2014-08-02 NOTE — Progress Notes (Signed)
Recreation Therapy Notes   Animal-Assisted Activity/Therapy (AAA/T) Program Checklist/Progress Notes Patient Eligibility Criteria Checklist & Daily Group note for Rec Tx Intervention  Date: 02.02.2016 Time: 2:45pm [address]   AAA/T Program Assumption of Risk Form signed by Patient/ or Parent Legal Guardian yes  Patient is free of allergies or sever asthma yes  Patient reports no fear of animals yes  Patient reports no history of cruelty to animals yes  Patient understands his/her participation is voluntary yes  Patient washes hands before animal contact yes  Patient washes hands after animal contact yes  Behavioral Response: Appropriate   Education: Hand Washing, Appropriate Animal Interaction   Education Outcome: Acknowledges education.   Clinical Observations/Feedback: Patient interacted appropriately with therapy dog, petting him appropriately from floor level.   Marykay Lexenise L Chaynce Schafer, LRT/CTRS  Nikeya Maxim L 08/02/2014 7:52 AM

## 2014-08-02 NOTE — Progress Notes (Signed)
Pt attended NA group this evening.  

## 2014-08-02 NOTE — [facility] Group Notes (Signed)
[facility]   08/02/2014 11:15 AM    Participation Quality:  Appropraite  Mood/Affect:  Appropriate  Depression Rating:  3  Anxiety Rating:  3  Thoughts of Suicide:  No  Will you contract for safety?   NA  Current AVH:  No  Plan for Discharge/Comments:  Patient attended discharge planning group and actively participated in group. He reports doing okay and hopeful to discharge tomorrow.  he will follow up with the [facility] for outpatient services. Suicide prevention education reviewed and SPE document provided.   Transportation Means: Patient uses public transportation.   Supports:  Patient has a support system.   Samuel Atkinson, Samuel Atkinson

## 2014-08-03 DIAGNOSIS — F331 Major depressive disorder, recurrent, moderate: Secondary | ICD-10-CM | POA: Insufficient documentation

## 2014-08-03 MED ORDER — CALCIUM-MAGNESIUM 300-300 MG PO TABS
1.0000 | ORAL_TABLET | Freq: Every day | ORAL | Status: DC
Start: 2014-08-03 — End: 2014-09-11

## 2014-08-03 MED ORDER — SUPER B-50 COMPLEX PO CAPS: 1.0000 | ORAL_CAPSULE | Freq: Every day | ORAL | Status: DC

## 2014-08-03 MED ORDER — MIRTAZAPINE 30 MG PO TABS: 30.0000 mg | ORAL_TABLET | Freq: Every day | ORAL | Status: DC

## 2014-08-03 MED ORDER — RISPERIDONE 2 MG PO TABS: 2.0000 mg | ORAL_TABLET | Freq: Every day | ORAL | Status: DC

## 2014-08-03 MED ORDER — MAGNESIUM CITRATE PO SOLN
1.0000 | Freq: Once | ORAL | Status: AC
Start: 2014-08-03 — End: 2014-08-03
  Administered 2014-08-03: 1 via ORAL

## 2014-08-03 MED ORDER — ALBUTEROL SULFATE HFA 108 (90 BASE) MCG/ACT IN AERS: 1.0000 | INHALATION_SPRAY | Freq: Four times a day (QID) | RESPIRATORY_TRACT | Status: DC | PRN

## 2014-08-03 MED ORDER — TIOTROPIUM BROMIDE MONOHYDRATE 18 MCG IN CAPS: 18.0000 ug | ORAL_CAPSULE | Freq: Every day | RESPIRATORY_TRACT | Status: DC | PRN

## 2014-08-03 MED ORDER — ADULT MULTIVITAMIN W/MINERALS CH: 1.0000 | ORAL_TABLET | Freq: Every day | ORAL | Status: DC

## 2014-08-03 MED ORDER — LEVETIRACETAM ER 750 MG PO TB24: 1500.0000 mg | ORAL_TABLET | Freq: Every day | ORAL | Status: DC

## 2014-08-03 MED ORDER — AMLODIPINE BESYLATE 10 MG PO TABS: 10.0000 mg | ORAL_TABLET | Freq: Every day | ORAL | Status: DC

## 2014-08-03 MED ORDER — THIAMINE HCL 100 MG PO TABS: 100.0000 mg | ORAL_TABLET | Freq: Every day | ORAL | Status: DC

## 2014-08-03 NOTE — Progress Notes (Addendum)
Discharge Note:  Patient discharged home with family member.  Denied SI and HI.  Denied A/V hallucinations.  Patient stated he received all his belongings, clothing, misc items, toiletries, coat, 2 pairs shoes, strings, medications.  Suicide prevention information given and discussed with patient who stated he understood and had no questions.  Patient stated he [facility].  Patient was in a hurry to go to dentist.  Family member was told and she rolled her eyes and shook her head at patient.  Patient would not wait for his prescriptions.  Patient had not asked for any pain medications this morning.

## 2014-08-03 NOTE — Progress Notes (Signed)
D:  Patient's self inventory sheet, patient sleep good, sleep medication is helpful.  Good appetite, normal energy level, good concentration.  Rated depression, hopeless and anxiety #1.  Complained of constipation.  Denied physical problem.  Denied physical pain. Goal is to go home and success in life.  Plans to stay positive. A:  Medications administered per MD orders.  Emotional support and encouragement given patient. R:  Denied SI and HI.  Denied A/V hallucinations.  Safety maintained with 15 minute checks. Patient stated he needed his tennis shoes out of his locker to clean before discharge.  Nurse and security guard got his tennis shoes without strings and brought to patient.  Patient has continually said he needs to leave by noon.  Then patient told [facility] that he needs to hurry and go to dentist and have tooth pulled.  Patient denied pain this morning to nurse.  Patient has no sign of pain/distress noted on patient's face or body movements.   Safety maintained with 15 minute checks.

## 2014-08-03 NOTE — [facility] Group Notes (Signed)
The focus of this group is to educate the patient on the purpose and policies of crisis stabilization and provide a format to answer questions about their admission.  The group details unit policies and expectations of patients while admitted.  Patient attended 0900 nurse education orientation group this morning.  Patient actively participated, appropriate affect, alert, appropriate insight and engagement.  Today patient will work on 3 goals for discharge.  

## 2014-08-03 NOTE — Progress Notes (Signed)
  [facility] Adult [facility] Management Discharge Plan :  Will you be returning to the same living situation after discharge:  Yes,  home with wife At discharge, do you have transportation home?: Yes,  wife  Do you have the ability to pay for your medications: Yes,  [employer]  Release of information consent forms completed and submitted to medical records by CSW.  Patient to Follow up at: Follow-up Information    Follow up with [facility] On 08/07/2014.   Why:  Appt for assessment on this date at 12:00PM. (assessment is for medication management and SA IOP). Please arrive by 11:45AM  to complete new patient paperwork and bring [employer] insurance card.    Contact information:   [address] [place], [place] [address] Phone: [phone] Fax: [phone]      Patient denies SI/HI: Yes,  during group/self report.    Safety Planning and Suicide Prevention discussed: Yes,  SPE completed with pt's wife. SPI pamphlet also provided to pt and he was encouraged to share information with support network, ask questions, and talk about any concerns relating to SPE.  Has patient been referred to the Quitline?: Patient refused referral-the patches do nothing for me. I've been quitting on my own. Pt given Quitline info if he changes mind and wants help establishing quit plan.   Smart, Ricquel Foulk LCSWA  08/03/2014, 10:27 AM

## 2014-08-03 NOTE — Discharge Summary (Signed)
Physician Discharge Summary Note  Patient:  Samuel Atkinson is an 49 y.o., male MRN:  [id number] DOB:  Oct 28, 1965 Patient phone:  [phone] (home)  Patient address:   [address] [place] [address],  Total Time spent with patient: 30 minutes  Date of Admission:  07/29/2014 Date of Discharge: 08/03/2014  Reason for Admission:  Samuel Atkinson is 49 years old, an African-American male, admitted from the [facility]. He reports, "My wife took me to the hospital last night. I had started using drugs & alcohol again. I relapsed 2 days ago. I got very stressed, became very anxious I relapsed. I guess I did not deal with things the way that I should. I'm struggling with life in general. I was sober x 4 months before relapsing 2 days ago. I was just getting out of the [facility] for drug & alcohol abuse rehab. I was trying to overdose on drugs".   Principal Problem: MDD (major depressive disorder) Discharge Diagnoses: Patient Active Problem List   Diagnosis Date Noted  . Major depressive disorder, recurrent episode, moderate [F33.1]   . Polysubstance abuse [F19.10]   . Suicidal ideation [R45.851]   . MDD (major depressive disorder) [F32.2] 03/27/2014  . 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 Cocaine abuse [F14.10] 03/26/2014  . Depression [F32.9] 03/26/2014  . Seizures [R56.9] 02/09/2013  . Cerebral AVM [Q28.2] 02/09/2013   Musculoskeletal: Strength & Muscle Tone: within normal limits Gait & Station: normal Patient leans: N/A  Psychiatric Specialty Exam: Physical Exam  Vitals reviewed. Psychiatric: He has a normal mood and affect. His speech is normal and behavior is normal. Cognition and memory are normal.    Review of Systems  Constitutional: Negative.   HENT: Negative.   Eyes: Negative.   Respiratory: Negative.   Cardiovascular: Negative.   Gastrointestinal: Negative.   Genitourinary: Negative.   Musculoskeletal: Negative.    Skin: Negative.   Neurological: Negative.   Endo/Heme/Allergies: Negative.   Psychiatric/Behavioral: The patient is nervous/anxious.     Blood pressure 149/78, pulse 105, temperature 98.2 F (36.8 C), temperature source Oral, resp. rate 24, height  (1.778 m), weight 73.936 kg (163 lb).Body mass index is 23.39 kg/(m^2).   Past Medical History:  Past Medical History  Diagnosis Date  . Hypertension   . Seizure disorder   . Hx of substance abuse   . AVM (arteriovenous malformation)   . Seizures     Past Surgical History  Procedure Laterality Date  . Avm embolization  2013   Family History:  Family History  Problem Relation Age of Onset  . Hypertension Father   . Coronary artery disease Father   . Diabetes Mother   . Hypertension Mother   . Coronary artery disease Brother   . Healthy Brother   . Coronary artery disease Brother    Social History:  History  Alcohol Use  . Yes    Comment: Unsure of amount / pt. states he drank $150 worth ETOH in 2 days     History  Drug Use  . Yes  . Special: Cocaine    Comment: Unsure of amount /states $700 worth in 2 days    History   Social History  . Marital Status: Single    Spouse Name: [name]    Number of Children: 3  . Years of Education: GED    Occupational History  . Unemployed    Social History Main Topics  . Smoking status: Current Every  Day Smoker -- 0.50 packs/day    Types: Cigarettes  . Smokeless tobacco: Never Used  . Alcohol Use: Yes     Comment: Unsure of amount / pt. states he drank $150 worth ETOH in 2 days  . Drug Use: Yes    Special: Cocaine     Comment: Unsure of amount /states $700 worth in 2 days  . Sexual Activity: Yes    Birth Control/ Protection: None   Other Topics Concern  . None   Social History Narrative   Patient lives at home with [name]). Patient works full time time.   Education 12 th grade.   Caffeine two cups of coffee daily. Three mountain dews daily.   Right handed.     Past Psychiatric History: Hospitalizations:  Depression [facility]  Outpatient Care:  Substance Abuse Care:  Self-Mutilation:  Suicidal Attempts:  Violent Behaviors:   Risk to Self: Is patient at risk for suicide?: No What has been your use of drugs/alcohol within the last 12 months?: Cocaine - $700 worth over 2 days 1/28-1/29, $150 worth of alcohol over 2 days.  Was clean and sober for 4 months prior.   Risk to Others:   Prior Inpatient Therapy:   Prior Outpatient Therapy:    Level of Care:  OP  Hospital Course:  Samuel Atkinson is 49 years old, an African-American male, admitted from the [facility]. He reports, "My wife took me to the hospital last night. I had started using drugs & alcohol again. I relapsed 2 days ago. I got very stressed, became very anxious I relapsed. I guess I did not deal with things the way that I should. I'm struggling with life in general. I was sober x 4 months before relapsing 2 days ago. I was just getting out of the [facility] for drug & alcohol abuse rehab. I was trying to overdose on drugs".   Unnamed showed gradual improvement with inpatient treatment.  He was re-stabilized on med management and group therapy.  Per nursing, he was adherent to his medication regimen and was observed to attend groups.  At time of discharge, he  rated both depression and anxiety levels to be manageable and minimal.  He was able to identify the triggers of his emotional crises and de-stabilizations.  He stated that he learned better ways to deal with feelings of depression and etoh dependence.  He did well with the medications prescribed for him.  Denies physiological concerns/SI/HI/AVH at time of discharge.  He has spoken with his wife and states he has satisfactory support network and home environment and will adhere to medication compliance and outpatient treatment.     Consults:  rehabilitation medicine  Significant Diagnostic Studies:  labs: per ED  Discharge  Vitals:   Blood pressure 149/78, pulse 105, temperature 98.2 F (36.8 C), temperature source Oral, resp. rate 24, height  (1.778 m), weight 73.936 kg (163 lb). Body mass index is 23.39 kg/(m^2). Lab Results:   No results found for this or any previous visit (from the past 72 hour(s)).  Physical Findings: AIMS: Facial and Oral Movements Muscles of Facial Expression: None, normal Lips and Perioral Area: None, normal Jaw: None, normal Tongue: None, normal,Extremity Movements Upper (arms, wrists, hands, fingers): None, normal Lower (legs, knees, ankles, toes): None, normal, Trunk Movements Neck, shoulders, hips: None, normal, Overall Severity Severity of abnormal movements (highest score from questions above): None, normal Incapacitation due to abnormal movements: None, normal Patient's awareness of abnormal movements (rate  only patient's report): No Awareness, Dental Status Current problems with teeth and/or dentures?: No Does patient usually wear dentures?: No  CIWA:  CIWA-Ar Total: 1 COWS:  COWS Total Score: 2   See Psychiatric Specialty Exam and Suicide Risk Assessment completed by Attending Physician prior to discharge.  Discharge destination:  Home  Is patient on multiple antipsychotic therapies at discharge:  No   Has Patient had three or more failed trials of antipsychotic monotherapy by history:  No  Recommended Plan for Multiple Antipsychotic Therapies: NA    Medication List    STOP taking these medications        escitalopram 10 MG tablet  Commonly known as:  LEXAPRO     naltrexone 50 MG tablet  Commonly known as:  DEPADE     Vitamin D-3 5000 UNITS Tabs      TAKE these medications      Indication   albuterol 108 (90 BASE) MCG/ACT inhaler  Commonly known as:  PROVENTIL HFA;VENTOLIN HFA  Inhale 1-2 puffs into the lungs every 6 (six) hours as needed for wheezing or shortness of breath.   Indication:  Chronic Obstructive Lung Disease     amLODipine 10  MG tablet  Commonly known as:  NORVASC  Take 1 tablet (10 mg total) by mouth daily.   Indication:  High Blood Pressure     Calcium-Magnesium 300-300 MG Tabs  Commonly known as:  CALCIUM MAGNESIUM 750  Take 1 tablet by mouth daily.   Indication:  deficiency     Levetiracetam 750 MG Tb24  Take 2 tablets (1,500 mg total) by mouth at bedtime.   Indication:  mood stabilization     mirtazapine 30 MG tablet  Commonly known as:  REMERON  Take 1 tablet (30 mg total) by mouth at bedtime.   Indication:  Trouble Sleeping     multivitamin with minerals Tabs tablet  Take 1 tablet by mouth daily.   Indication:  supplement     risperiDONE 2 MG tablet  Commonly known as:  RISPERDAL  Take 1 tablet (2 mg total) by mouth at bedtime.   Indication:  Mood control     SUPER B-50 COMPLEX Caps  Take 1 capsule by mouth daily.   Indication:  deficiency     thiamine 100 MG tablet  Take 1 tablet (100 mg total) by mouth daily.   Indication:  Deficiency in Thiamine or Vitamin B1     tiotropium 18 MCG inhalation capsule  Commonly known as:  SPIRIVA  Place 1 capsule (18 mcg total) into inhaler and inhale daily as needed (for shortness of breath).   Indication:  Chronic Obstructive Lung Disease       Follow-up Information    Follow up with [facility] On 08/07/2014.   Why:  Appt for assessment on this date at 12:00PM. (assessment is for medication management and SA IOP). Please arrive by 11:45AM  to complete new patient paperwork and bring [employer] card.    Contact information:   [address] [place], [place] [address] Phone: [phone] Fax: [phone]      Follow-up recommendations:  Activity:  as tol, diet as tol  Comments:  1.  Take all your medications as prescribed.              2.  Report any adverse side effects to outpatient provider.                       3.  Patient instructed to not use alcohol or illegal drugs while on prescription medicines.            4.  In the  event of worsening symptoms, instructed patient to call 911, the crisis hotline or go to nearest emergency room for evaluation of symptoms.  Total Discharge Time:  30 min  Signed: Adonis BrookGUSTIN, SHEILA MAY, AGNP-BC 08/03/2014, 5:14 PM  I personally assessed the patient and formulated the plan Madie RenoIrving A. Dub MikesLugo, M.D.

## 2014-08-03 NOTE — [facility] Suicide Risk Assessment (Signed)
[facility] Discharge Suicide Risk Assessment   Demographic Factors:  Male  Total Time spent with patient: 30 minutes  Musculoskeletal: Strength & Muscle Tone: within normal limits Gait & Station: normal Patient leans: N/A  Psychiatric Specialty Exam: Physical Exam  Review of Systems  Constitutional: Negative.   HENT: Negative.   Eyes: Negative.   Respiratory: Negative.   Cardiovascular: Negative.   Gastrointestinal: Negative.   Genitourinary: Negative.   Musculoskeletal: Negative.   Skin: Negative.   Neurological: Negative.   Endo/Heme/Allergies: Negative.   Psychiatric/Behavioral: Positive for substance abuse. The patient is nervous/anxious.     Blood pressure 138/53, pulse 97, temperature 98.2 F (36.8 C), temperature source Oral, resp. rate 24, height  (1.778 m), weight 73.936 kg (163 lb).Body mass index is 23.39 kg/(m^2).  General Appearance: Fairly Groomed  Patent attorney::  Fair  Speech:  Clear and Coherent409  Volume:  Normal  Mood:  Euthymic  Affect:  Appropriate  Thought Process:  Coherent and Goal Directed  Orientation:  Full (Time, Place, and Person)  Thought Content:  plans as he moves on, relapse prevention plan  Suicidal Thoughts:  No  Homicidal Thoughts:  No  Memory:  Immediate;   Fair Recent;   Fair Remote;   Fair  Judgement:  Fair  Insight:  Present  Psychomotor Activity:  Restlessness  Concentration:  Fair  Recall:  [employer] of Knowledge:Fair  Language: Fair  Akathisia:  No  Handed:  Right  AIMS (if indicated):     Assets:  Desire for Improvement Housing Social Support Talents/Skills  Sleep:  Number of Hours: 6.75  Cognition: WNL  ADL's:  Intact   Have you used any form of tobacco in the last 30 days? (Cigarettes, Smokeless Tobacco, Cigars, and/or Pipes): Yes  Has this patient used any form of tobacco in the last 30 days? (Cigarettes, Smokeless Tobacco, Cigars, and/or Pipes) Yes, A prescription for an FDA-approved tobacco cessation  medication was offered at discharge and the patient refused  Mental Status Per Nursing Assessment::   On Admission:  Intention to act on plan to harm others  Current Mental Status by Physician: in full contact with reality. There are no active S/S of withdrawal. There are no active SI/HI plans or intent. He is willing and motivated to pursue outpatient treatment. He is going to be taken by wife to see a dentist today as he has a toothache that he wants to address as soon as possible. 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   Loss Factors: NA  Historical Factors: NA  Risk Reduction Factors:   Sense of responsibility to family, Religious beliefs about death, Living with another person, especially a relative and Positive social support  Continued Clinical Symptoms:  Depression:   Comorbid alcohol abuse/dependence Alcohol/Substance Abuse/Dependencies  Cognitive Features That Contribute To Risk:  Closed-mindedness, Polarized thinking and Thought constriction (tunnel vision)    Suicide Risk:  Minimal: No identifiable suicidal ideation.  Patients presenting with no risk factors but with morbid ruminations; may be classified as minimal risk based on the severity of the depressive symptoms  Principal Problem: <principal problem not specified> Discharge Diagnoses:  Patient Active Problem List   Diagnosis Date Noted  . Polysubstance abuse [F19.10]   . Suicidal ideation [R45.851]   . MDD (major depressive disorder) [F32.2] 03/27/2014  . 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 Cocaine abuse [F14.10] 03/26/2014  . Depression [F32.9] 03/26/2014  . Seizures [R56.9] 02/09/2013  .  Cerebral AVM [Q28.2] 02/09/2013    Follow-up Information    Follow up with [facility] On 08/07/2014.   Why:  Appt for assessment on this date at 12:00PM. (assessment is for medication management and SA IOP). Please arrive by 11:45AM  to complete new  patient paperwork and bring [employer] insurance card.    Contact information:   [address] [place], [place] [address] Phone: [phone] Fax: [phone]      Plan Of Care/Follow-up recommendations:  Activity:  as tolerated Diet:  regular Follow up [facility] as above Is patient on multiple antipsychotic therapies at discharge:  No   Has Patient had three or more failed trials of antipsychotic monotherapy by history:  No  Recommended Plan for Multiple Antipsychotic Therapies: NA    Tyrion Glaude A 08/03/2014, 10:15 AM

## 2014-08-03 NOTE — Progress Notes (Signed)
D. Pt had been up and visible in milieu this evening, did attend and participate in evening group session. Pt did speak of impending discharge in the morning and spoke about how he feels ready for discharge. Pt did not appear to be in any acute distress this evening and did receive all medications without incident. A. Support and encouragement provided. R. Safety maintained, will continue to monitor.

## 2014-08-08 NOTE — Progress Notes (Signed)
Patient Discharge Instructions:  After Visit Summary (AVS):   Faxed to:  08/08/14 Discharge Summary Note:   Faxed to:  08/08/14 Psychiatric Admission Assessment Note:   Faxed to:  08/08/14 Suicide Risk Assessment - Discharge Assessment:   Faxed to:  08/08/14 Faxed/Sent to the Next Level Care provider:  08/08/14 Faxed to [facility] @ [phone]  Jerelene ReddenSheena E , 08/08/2014, 3:49 PM

## 2014-08-28 ENCOUNTER — Ambulatory Visit: Payer: Managed Care, Other (non HMO) | Admitting: Neurology

## 2014-08-30 ENCOUNTER — Ambulatory Visit: Payer: Managed Care, Other (non HMO) | Admitting: Adult Health

## 2014-08-31 ENCOUNTER — Encounter: Payer: Self-pay | Admitting: Adult Health

## 2014-09-01 ENCOUNTER — Encounter (HOSPITAL_COMMUNITY): Payer: Self-pay

## 2014-09-01 ENCOUNTER — Emergency Department (HOSPITAL_COMMUNITY)
Admission: EM | Admit: 2014-09-01 | Discharge: 2014-09-01 | Disposition: A | Discharge status: Home / Self Care | Payer: 59 | Attending: Emergency Medicine | Admitting: Emergency Medicine

## 2014-09-01 DIAGNOSIS — F1419 Cocaine abuse with unspecified cocaine-induced disorder: Secondary | ICD-10-CM | POA: Insufficient documentation

## 2014-09-01 DIAGNOSIS — F1410 Cocaine abuse, uncomplicated: Secondary | ICD-10-CM

## 2014-09-01 DIAGNOSIS — I10 Essential (primary) hypertension: Secondary | ICD-10-CM | POA: Diagnosis not present

## 2014-09-01 DIAGNOSIS — G40909 Epilepsy, unspecified, not intractable, without status epilepticus: Secondary | ICD-10-CM | POA: Diagnosis not present

## 2014-09-01 DIAGNOSIS — Z91040 Latex allergy status: Secondary | ICD-10-CM | POA: Insufficient documentation

## 2014-09-01 DIAGNOSIS — Z79899 Other long term (current) drug therapy: Secondary | ICD-10-CM | POA: Insufficient documentation

## 2014-09-01 DIAGNOSIS — Z8774 Personal history of (corrected) congenital malformations of heart and circulatory system: Secondary | ICD-10-CM | POA: Insufficient documentation

## 2014-09-01 DIAGNOSIS — Z008 Encounter for other general examination: Secondary | ICD-10-CM | POA: Diagnosis present

## 2014-09-01 DIAGNOSIS — Z720 Tobacco use: Secondary | ICD-10-CM | POA: Diagnosis not present

## 2014-09-01 LAB — RAPID URINE DRUG SCREEN, HOSP PERFORMED
Amphetamines: NOT DETECTED
Barbiturates: NOT DETECTED
Benzodiazepines: NOT DETECTED
COCAINE: POSITIVE — AB
OPIATES: NOT DETECTED
Tetrahydrocannabinol: NOT DETECTED

## 2014-09-01 LAB — ACETAMINOPHEN LEVEL: Acetaminophen (Tylenol), Serum: <10 ug/mL — ABNORMAL LOW (ref 10–30)

## 2014-09-01 LAB — SALICYLATE LEVEL

## 2014-09-01 LAB — ETHANOL: Alcohol, Ethyl (B): <5 mg/dL (ref 0–9)

## 2014-09-01 NOTE — ED Provider Notes (Signed)
CSN: [id number]     Arrival date & time 09/01/14  1319 History   First MD Initiated Contact with Patient 09/01/14 1510     Chief Complaint  Patient presents with  . Medical Clearance     (Consider location/radiation/quality/duration/timing/severity/associated sxs/prior Treatment) HPI   Samuel Atkinson is a 49 y.o. male who is here for agitation and anger outbursts. He also used cocaine recently because he "stopped taking my medications." He states that he does not take his medicines because he does not like how it. They make him feel. He was discharged from [facility] January 2015, with recommendation to follow-up for intensive outpatient treatment. He did not do that. He works temporary jobs. He lives with his wife who is here with him. They have gone to marriage counseling in the past but not currently. He is not seeing therapist or psychiatry at this time. He denies recent illnesses, including fever, chills, nausea, vomiting, cough, shortness of breath or chest pain. He denies suicidal or homicidal ideation. There are no other known modifying factors.   Past Medical History  Diagnosis Date  . Hypertension   . Seizure disorder   . Hx of substance abuse   . AVM (arteriovenous malformation)   . Seizures    Past Surgical History  Procedure Laterality Date  . Avm embolization  2013   Family History  Problem Relation Age of Onset  . Hypertension Father   . Coronary artery disease Father   . Diabetes Mother   . Hypertension Mother   . Coronary artery disease Brother   . Healthy Brother   . Coronary artery disease Brother    History  Substance Use Topics  . Smoking status: Current Every Day Smoker -- 0.50 packs/day    Types: Cigarettes  . Smokeless tobacco: Never Used  . Alcohol Use: Yes     Comment: Unsure of amount / pt. states he drank $150 worth ETOH in 2 days    Review of Systems  All other systems reviewed and are negative.     Allergies  Latex  Home  Medications   Prior to Admission medications   Medication Sig Start Date End Date Taking? Authorizing Provider  albuterol (PROVENTIL HFA;VENTOLIN HFA) 108 (90 BASE) MCG/ACT inhaler Inhale 1-2 puffs into the lungs every 6 (six) hours as needed for wheezing or shortness of breath. 08/03/14   Velna Hatchet May Agustin, NP  amLODipine (NORVASC) 10 MG tablet Take 1 tablet (10 mg total) by mouth daily. 08/03/14   Velna Hatchet May Agustin, NP  B Complex-Biotin-FA (SUPER B-50 COMPLEX) CAPS Take 1 capsule by mouth daily. 08/03/14   Velna Hatchet May Agustin, NP  Calcium-Magnesium (CALCIUM MAGNESIUM 750) 300-300 MG TABS Take 1 tablet by mouth daily. 08/03/14   Velna Hatchet May Agustin, NP  levETIRAcetam 750 MG TB24 Take 2 tablets (1,500 mg total) by mouth at bedtime. 08/03/14   Velna Hatchet May Agustin, NP  mirtazapine (REMERON) 30 MG tablet Take 1 tablet (30 mg total) by mouth at bedtime. 08/03/14   Velna Hatchet May Agustin, NP  Multiple Vitamin (MULTIVITAMIN WITH MINERALS) TABS tablet Take 1 tablet by mouth daily. 08/03/14   Velna Hatchet May Agustin, NP  risperiDONE (RISPERDAL) 2 MG tablet Take 1 tablet (2 mg total) by mouth at bedtime. 08/03/14   Lindwood Qua, NP  thiamine 100 MG tablet Take 1 tablet (100 mg total) by mouth daily. 08/03/14   Velna Hatchet May Agustin, NP  tiotropium (SPIRIVA) 18 MCG inhalation capsule Place 1 capsule (18 mcg total) into inhaler  and inhale daily as needed (for shortness of breath). 08/03/14   Velna HatchetSheila May Agustin, NP   BP 125/57 mmHg  Pulse 70  Temp(Src) 98.4 F (36.9 C) (Oral)  Resp 18  SpO2 96% Physical Exam  Constitutional: He is oriented to person, place, and time. He appears well-developed and well-nourished.  HENT:  Head: Normocephalic and atraumatic.  Right Ear: External ear normal.  Left Ear: External ear normal.  Eyes: Conjunctivae and EOM are normal. Pupils are equal, round, and reactive to light.  Neck: Normal range of motion and phonation normal. Neck supple.  Cardiovascular: Normal rate, regular rhythm and normal  heart sounds.   Pulmonary/Chest: Effort normal and breath sounds normal. He exhibits no bony tenderness.  Abdominal: Soft. There is no tenderness.  Musculoskeletal: Normal range of motion.  Neurological: He is alert and oriented to person, place, and time. No cranial nerve deficit or sensory deficit. He exhibits normal muscle tone. Coordination normal.  Skin: Skin is warm, dry and intact.  Psychiatric: He has a normal mood and affect. His behavior is normal. Judgment and thought content normal.  Nursing note and vitals reviewed.   ED Course  Procedures (including critical care time)   I discussed the process for evaluation of these types of problems. Patient chose to not stay and talk to TTS. He stated that if I gave him the phone number for intensive outpatient, he would call them and schedule follow-up for treatment. His wife is with him, and was supportive of this plan. She will make sure that he calls IOP   Labs Review Labs Reviewed  URINE RAPID DRUG SCREEN (HOSP PERFORMED) - Abnormal; Notable for the following:    Cocaine POSITIVE (*)    All other components within normal limits  ACETAMINOPHEN LEVEL  CBC  COMPREHENSIVE METABOLIC PANEL  ETHANOL  SALICYLATE LEVEL    Imaging Review No results found.   EKG Interpretation None      MDM   Final diagnoses:  Cocaine abuse    Cocaine abuse, with medical noncompliance. Patient is not suicidal, homicidal or in danger of worsening symptoms from cocaine abuse.  Nursing Notes Reviewed/ Care Coordinated Applicable Imaging Reviewed Interpretation of Laboratory Data incorporated into ED treatment  The patient appears reasonably screened and/or stabilized for discharge and I doubt any other medical condition or other [facility] requiring further screening, evaluation, or treatment in the ED at this time prior to discharge.  Plan: Home Medications- usual; Home Treatments- rest; return here if the recommended treatment, does not improve  the symptoms; Recommended follow up- F/U IOP asap     Flint MelterElliott L Ajit Errico, MD 09/01/14 1555

## 2014-09-01 NOTE — Discharge Instructions (Signed)
Call the IOP program to schedule an appointment asap. Their number is, [phone]    \Stimulant Use Disorder-Cocaine Cocaine is one of a group of powerful drugs called stimulants. Cocaine has medical uses for stopping nosebleeds and for pain control before minor nose or dental surgery. However, cocaine is misused because of the effects that it produces. These effects include:   A feeling of extreme pleasure.  Alertness.  High energy. Common street names for cocaine include coke, crack, blow, snow, and nose candy. Cocaine is snorted, dissolved in water and injected, or smoked.  Stimulants are addictive because they activate regions of the brain that produce both the pleasurable sensation of "reward" and psychological dependence. Together, these actions account for loss of control and the rapid development of drug dependence. This means you become ill without the drug (withdrawal) and need to keep using it to function.  Stimulant use disorder is use of stimulants that disrupts your daily life. It disrupts relationships with family and friends and how you do your job. Cocaine increases your blood pressure and heart rate. It can cause a heart attack or stroke. Cocaine can also cause death from irregular heart rate or seizures. SYMPTOMS Symptoms of stimulant use disorder with cocaine include:  Use of cocaine in larger amounts or over a longer period of time than intended.  Unsuccessful attempts to cut down or control cocaine use.  A lot of time spent obtaining, using, or recovering from the effects of cocaine.  A strong desire or urge to use cocaine (craving).  Continued use of cocaine in spite of major problems at work, school, or home because of use.  Continued use of cocaine in spite of relationship problems because of use.  Giving up or cutting down on important life activities because of cocaine use.  Use of cocaine over and over in situations when it is physically hazardous, such as  driving a car.  Continued use of cocaine in spite of a physical problem that is likely related to use. Physical problems can include:  Malnutrition.  Nosebleeds.  Chest pain.  High blood pressure.  A hole that develops between the part of your nose that separates your nostrils (perforated nasal septum).  Lung and kidney damage.  Continued use of cocaine in spite of a mental problem that is likely related to use. Mental problems can include:  Schizophrenia-like symptoms.  Depression.  Bipolar mood swings.  Anxiety.  Sleep problems.  Need to use more and more cocaine to get the same effect, or lessened effect over time with use of the same amount of cocaine (tolerance).  Having withdrawal symptoms when cocaine use is stopped, or using cocaine to reduce or avoid withdrawal symptoms. Withdrawal symptoms include:  Depressed or irritable mood.  Low energy or restlessness.  Bad dreams.  Poor or excessive sleep.  Increased appetite. DIAGNOSIS Stimulant use disorder is diagnosed by your health care provider. You may be asked questions about your cocaine use and how it affects your life. A physical exam may be done. A drug screen may be ordered. You may be referred to a mental health professional. The diagnosis of stimulant use disorder requires at least two symptoms within 12 months. The type of stimulant use disorder depends on the number of signs and symptoms you have. The type may be:  Mild. Two or three signs and symptoms.  Moderate. Four or five signs and symptoms.  Severe. Six or more signs and symptoms. TREATMENT Treatment for stimulant use disorder is usually  provided by mental health professionals with training in substance use disorders. The following options are available:  Counseling or talk therapy. Talk therapy addresses the reasons you use cocaine and ways to keep you from using again. Goals of talk therapy include:  Identifying and avoiding triggers for  use.  Handling cravings.  Replacing use with healthy activities.  Support groups. Support groups provide emotional support, advice, and guidance.  Medicine. Certain medicines may decrease cocaine cravings or withdrawal symptoms. HOME CARE INSTRUCTIONS  Take medicines only as directed by your health care provider.  Identify the people and activities that trigger your cocaine use and avoid them.  Keep all follow-up visits as directed by your health care provider. SEEK MEDICAL CARE IF:  Your symptoms get worse or you relapse.  You are not able to take medicines as directed. SEEK IMMEDIATE MEDICAL CARE IF:  You have serious thoughts about hurting yourself or others.  You have a seizure, chest pain, sudden weakness, or loss of speech or vision. FOR MORE INFORMATION  National Institute on Drug Abuse: http://www.price-smith.com/  Substance Abuse and Mental Health Services Administration: SkateOasis.com.pt Document Released: 06/13/2000 Document Revised: 10/31/2013 Document Reviewed: 06/29/2013 [facility] Patient Information 2015 [place], [place]. This information is not intended to replace advice given to you by your health care provider. Make sure you discuss any questions you have with your health care provider.    Emergency Department Resource Guide 1) Find a Doctor and Pay Out of Pocket Although you won't have to find out who is covered by your insurance plan, it is a good idea to ask around and get recommendations. You will then need to call the office and see if the doctor you have chosen will accept you as a new patient and what types of options they offer for patients who are self-pay. Some doctors offer discounts or will set up payment plans for their patients who do not have insurance, but you will need to ask so you aren't surprised when you get to your appointment.  2) Contact Your Local Health Department Not all health departments have doctors that can see patients for sick visits, but  many do, so it is worth a call to see if yours does. If you don't know where your local health department is, you can check in your phone book. The CDC also has a tool to help you locate your state's health department, and many state websites also have listings of all of their local health departments.  3) Find a Walk-in Clinic If your illness is not likely to be very severe or complicated, you may want to try a walk in clinic. These are popping up all over the country in pharmacies, drugstores, and shopping centers. They're usually staffed by nurse practitioners or physician assistants that have been trained to treat common illnesses and complaints. They're usually fairly quick and inexpensive. However, if you have serious medical issues or chronic medical problems, these are probably not your best option.  No Primary Care Doctor: - Call Health Connect at  [phone] - they can help you locate a primary care doctor that  accepts your insurance, provides certain services, etc. - Physician Referral Service- [phone]  Chronic Pain Problems: Organization         Address  Phone   Notes  [facility]  [phone] Patients need to be referred by their primary care doctor.   Medication Assistance: Retail buyer  Notes  [facility] [address] [place], [place] [address] [phone] --Must be a resident of [facility] -- Must have NO insurance coverage whatsoever (no Medicaid/ Medicare, etc.) -- The pt. MUST have a primary care doctor that directs their care regularly and follows them in the community   [facility]ist  [phone]) [phone]   [employer]  [phone]    Agencies that provide inexpensive medical care: Organization         Address  Phone   Notes  [facility]  [phone]   [facility]    [phone]) [phone]   [facility] [address] [place], [place] [address] [phone]) [phone]   [facility] [address], [place] [phone]) [phone]   [facility]anned Parenthood    [phone](336) [phone]   [facility]    [phone]) [phone]   [facility]  [address], [place] Phone:  [phone], Fax:  [phone] Hours of Operation:  9 am - 6 pm, M-F.  Also accepts Medicaid/Medicare and self-pay.  [facility]  [address], [place] Phone: [phone], Fax: [phone]. Hours of Operation:  8:30 am - 5:30 pm, M-F.  Also accepts Medicaid and self-pay.  [facility] [place] [address], [place] Phone: [phone]   [facility] [address], [place], [place] [phone], Ext. [phone] Mondays & Thursdays: 7-9 AM.  First 15 patients are seen on a first come, first serve basis.    Medicaid-accepting [facility] Providers:  Organization         Address  Phone   Notes  [facility] [address], [place] [phone] Also accepts self-pay patients.  [facility] [address] [place], [place]  [phone]) [phone]   [facility] [address], [place] [phone]) [phone]   [facility] [address] [place] [address], [place] [phone]) [phone]   Renaye RakersVeita Bland [address], [place]   [phone]) [phone] Only accepts [place] Access [place] patients after they have their name applied to their card.   Self-Pay (no insurance) in [facility] [place]:  Organization         Address  Phone   Notes  Sickle Cell Patients, [facility] [address], [place] [phone]) [phone]   [facility] [address], [place] [phone]) [phone]   [facility] [place]  [address], [place] [phone]) [phone]   [facility]/Dr. Osei-Bonsu  [address] [place] [address], [place] or  [phone], [place] [phone] Phone number for both [place] and [place] locations is the same.  [facility] [address], [place] [phone]   [facility] [address] [place] [address], [place] or [address] [phone]   [facility] [address], [place] [phone]) [phone], phone; [phone]) [phone], fax Sees patients 1st and 3rd Saturday of every month.  Must not qualify for public or private insurance (i.e. Medicaid, Medicare, [place] Health Choice, Veterans' Benefits)  Household income should be no more than 200% of the poverty level The clinic cannot treat you if you are pregnant or think you are pregnant  Sexually transmitted  diseases are not treated at the clinic.    Dental Care: Organization         Address  Phone  Notes  [facility] Department of [facility] [address] [place], [place] [phone] Accepts children up to age 19 who are enrolled in [place] or [place] Health Choice; pregnant women with a Medicaid card; and children who have applied for Medicaid or [place] Health Choice, but were declined, whose parents can pay a reduced fee at time of service.  [facility] Department of [facility]  [address], [place] [phone] Accepts children up to age 62 who are enrolled in [place] or [place] Health Choice; pregnant women with a Medicaid card; and children who have applied for Medicaid or [place] Health Choice, but were declined, whose parents can pay a reduced fee at time of service.  [facility]  [address] [place], [place] [phone] Patients are seen by appointment only. Walk-ins are not accepted. [facility] will see patients 11 years of age and older. Monday - Tuesday (8am-5pm) Most Wednesdays (8:30-5pm) $30 per visit, cash only  [facility] Adult Dental Access PROGRAM  [address], [facility] [phone] Patients are seen by appointment only. Walk-ins are not accepted. [facility] will see patients 35 years of age and older. One Wednesday Evening (Monthly: Volunteer Based).  $30 per visit, cash only  [employer]  [phone] for adults; Children under age 62, call Graduate Pediatric Dentistry at [phone]. Children aged 86-14, please call [phone] to request a pediatric application.  Dental services are provided in all areas of dental care including fillings, crowns and bridges, complete and partial dentures, implants, gum treatment, root canals, and extractions. Preventive care is also provided. Treatment is provided to both adults and children. Patients are selected via a lottery and there is often a waiting list.   [facility] [address] [place]  [phone] [email or web address]   [facility] [address] [place], [place] [phone], Ext. [phone] Second and Fourth Thursday of each month, opens at 6:30 AM; Clinic ends at 9 AM.  Patients are seen on a first-come first-served basis, and a limited number are seen during each clinic.   [facility]  [address] [place], [place] [phone]   Eligibility Requirements You must have lived in [place], [place], or [place] counties for at least the last three months.   You cannot be eligible for state or federal sponsored [place], including [employer], [place], or [employer].   You generally cannot be eligible for healthcare insurance through your employer.    How to apply: Eligibility screenings are held every Tuesday and Wednesday afternoon from 1:00 pm until 4:00 pm. You do not need an appointment for the interview!  [facility] [address], [place], [place] [phone]   [facility] Health Department  [phone]   [facility] Health Department  [phone]   [facility]  Health Department  [phone]    Behavioral Health Resources in the Community: Intensive Outpatient Programs Organization         Address  Phone  Notes  [facility] [address] [place], [place] [phone]   [facility] Outpatient [address], [place], [place] [phone]   [facility] [address], [place], [place]  [phone]  [facility] [address]  [place], [place] [phone] or [phone]   Substance Abuse Resources Organization         Address  Phone  Notes  [facility] Drug [facility]  [phone]   [facility]  [phone]   [facility]  [phone]   Floydene Flock  [phone]   Residential & Outpatient Substance Abuse Program  [phone]   Psychological Services Organization         Address  Phone  Notes  [facility]  [phone]   [facility]  [phone]   [facility] Mental [facility] [address], [place] [phone] or [phone]    Mobile Crisis Teams Organization         Address  Phone  Notes  [facility], Mobile Crisis Care Unit  [phone]   [facility]  [address] [place], [place] [phone]   Doristine Locks [address] [place] [phone]    Self-Help/Support Groups Organization         Address  Phone             Notes  [facility] of [place] - variety of support groups  [phone]- I7437963 Call for more information  Narcotics Anonymous (NA), [facility] [address], [employer] [place]  2 meetings at this location   Statistician         Address  Phone  Notes  [facility] [address],    [place]  [phone]   [facility]  [address] [place] [address], [place], [place] [phone]   [facility] [address] [place], [place] [phone]  Admissions: 8am-3pm M-F  [facility] [address],    [place], [place] [phone]   [facility] [address] [place], [place], [place] [phone]   [facility] [address]  [place], [place] [phone]   [facility] - Intensive Outpatient [address], [place], [place] [phone]   [facility] (Addiction Recovery [facility] [address] [place]  [place], [place] [phone] or [phone]   [facility] ([facility]) [address], [place], [place] [phone] Accepts Medicaid  [facility] [place] [address],  [place] [phone] Substance Abuse/Addiction Treatment   [facility] Organization         Address  Phone  Notes  [facility]  [phone]   Angie Fava, PhD [address] [place], [place]   [phone] or [phone]   [facility]   [address] [place], [place] [phone]   [facility] [address], [place], [place] [phone] Insurance/Medicaid/sponsorship through [facility] Families [address]                                    [place], [place] [phone] Therapy/tele-psych/case  [facility] [address], [place] [phone]    Dr. Lolly Mustache  [phone]   [facility]  [employer] [facility] Dept. 1) [address]  2) [address], [place] 3)  [address], [place] [phone]  [phone]   [facility] [place] [facility] Child Abuse Hotline (234)759-3165 or [phone] (After Hours)

## 2014-09-01 NOTE — ED Notes (Signed)
Pt from home and brought in by wife. Pt reports he has been off his meds x weeks for depression and Bipolar. Pt denies SI or HI. He reports that "I feel smothered". Pt has flat affect during triage.  No psychiatrist currently per pt but he sees a neurologist who he reports prescribes his psych meds.

## 2014-09-05 ENCOUNTER — Encounter (HOSPITAL_COMMUNITY): Payer: Self-pay | Admitting: Psychology

## 2014-09-06 ENCOUNTER — Other Ambulatory Visit (HOSPITAL_COMMUNITY): Payer: PRIVATE HEALTH INSURANCE | Attending: Psychiatry | Admitting: Psychology

## 2014-09-07 ENCOUNTER — Encounter (HOSPITAL_COMMUNITY): Payer: Self-pay | Admitting: Psychology

## 2014-09-07 DIAGNOSIS — F1420 Cocaine dependence, uncomplicated: Secondary | ICD-10-CM | POA: Insufficient documentation

## 2014-09-07 NOTE — Progress Notes (Signed)
Samuel SingDavid Atkinson is a 49 y.o. male patient. 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 [place] with his wife of 2 years. The patient reported a long history of drug use that began in his teens. He first used powder cocaine at age 49. By age 49, the patient had switched to crack cocaine. He has been using it off and on ever since then. The patient reported he first drank alcohol at age 49, but he only uses when he is smoking crack. He smoked cannabis for the first time at age 89, but he has not smoked since the mid-80's. "It made me stupid", the patent explained. He denied any other drug use. The patient is a binge user and remains drug-free for about 3-4 weeks and then he goes on a binge of anywhere from 1-4 days. He will smoke $500 + of crack and drink 6-10 beers a day while on the binge. Most recently, the patient [facility], March 4,   [facility] after a binge. Prior to that, [facility]. The patient reported numerous residential [place] episodes, [place] where he has lived most of his life. [facility] also [facility] and [facility]. The patient reported that in 2013, he came off a long crack binge and felt very hopeless. He decided to end it and took a large quantity of pills, including tramadol and Zyrtec. He was taken to the hospital where he suffered a seizure. 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  He was determined to remain abstinent after that episode and attained 18 months of sobriety before relapsing. Prior to that he had once stayed clean 2 years. The patient attributed that period of sobriety with going to church and living out his faith. The patient reported his father was an alcoholic and he has 5 brothers, 4 of whom are  deceased. They were all heavy drug users. In addition to his chemical dependency, the patient is also diagnosed with Major Depressive Disorder. He is prescribed Risperdal (2 mg) and Remeron (30 mg). He also suffers from COPD and is prescribed Spiriva and Albuterol. The patient is currently unemployed, but has sought day labor in the past. 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 The patient was very agreeable throughout the orientation and very open about the dire consequences for his marriage and life if he continues to use drugs. The documentation was reviewed, signed and the orientation completed accordingly. The patient will return tomorrow to begin the CD-IOP.            Jewett Mcgann, LCAS

## 2014-09-07 NOTE — Progress Notes (Signed)
    Daily Group Progress Note  Program: CD-IOP   Group Time: 1-2:30 pm  Participation Level: Active  Behavioral Response: Appropriate and Sharing  Type of Therapy: Process Group  Topic: After checking in, the group went outside to complete a walking meditation.  After returning inside, group members shared and how the experience was for them.  Group members then took turns sharing about any recovery-related activities they had engaged in since the last group meeting, as well as any challenges they faced.  One group member reported that he had relapsed.  Drug test results were returned to some group members, and drug tests were collected from others.   Group Time: 2:45- 4pm  Participation Level: Active  Behavioral Response: Appropriate  Type of Therapy: Psycho-education Group  Topic: During the second portion of the group, counselors checked in with group members about the topic of core beliefs, and gave them some time to share what they had been thinking about and reflecting on over the past couple of days.  Group members then completed a mask activity.  On the front of the mask, group members were prompted to write words that express how they present themselves to the world and what others may see when looking at them.  On the inside of the mask, group members wrote words to show how they truly feel about themselves.  After the activity, they took turns sharing about their masks and discussed the differences between the inside and outside of their mask.  Summary: Today was the patient's first group meeting, and he was able to take the time to introduce himself to the group and explain what has led him to treatment at this time.  He told the group that he went out on a binge on 3/1 and realized that he does not like where his life is heading.  The patient talked about his use of crack, the losses he has experienced, and health issues he has.  He told the group that his wife is frustrated  with him and that he wants to get out of the vicious cycle he is in.  He had feedback for other group members and stated that, when he has cravings, he battles them himself and "never wins".  The patient served to highlight the importance of sharing with others and having a support network that one can reach out to. He also discussed his negative self talk.  During the mask activity, the patient wrote words such as "hopeful", "thoughtful", and "friendly" on the outside of his mask, while on the inside he wrote "loved", "faithful", and that he doesn't "measure up".  The patient responded well to this first intervention. His sobriety date is 3/2.   Family Program: Family present? No   Name of family member(s):   UDS collected: Yes Results: pending  AA/NA attended?: No  Sponsor?: No   Elodie Panameno, LCAS

## 2014-09-08 ENCOUNTER — Other Ambulatory Visit (HOSPITAL_COMMUNITY): Payer: PRIVATE HEALTH INSURANCE

## 2014-09-09 ENCOUNTER — Emergency Department (HOSPITAL_COMMUNITY)
Admission: EM | Admit: 2014-09-09 | Discharge: 2014-09-10 | Disposition: A | Discharge status: Other Acute Inpatient Hospital | Payer: 59 | Attending: Emergency Medicine | Admitting: Emergency Medicine

## 2014-09-09 ENCOUNTER — Encounter (HOSPITAL_COMMUNITY): Payer: Self-pay | Admitting: Emergency Medicine

## 2014-09-09 DIAGNOSIS — F1010 Alcohol abuse, uncomplicated: Secondary | ICD-10-CM | POA: Diagnosis present

## 2014-09-09 DIAGNOSIS — Z91040 Latex allergy status: Secondary | ICD-10-CM | POA: Insufficient documentation

## 2014-09-09 DIAGNOSIS — R45851 Suicidal ideations: Secondary | ICD-10-CM | POA: Insufficient documentation

## 2014-09-09 DIAGNOSIS — Z79899 Other long term (current) drug therapy: Secondary | ICD-10-CM | POA: Diagnosis not present

## 2014-09-09 DIAGNOSIS — I10 Essential (primary) hypertension: Secondary | ICD-10-CM | POA: Diagnosis not present

## 2014-09-09 DIAGNOSIS — G40909 Epilepsy, unspecified, not intractable, without status epilepticus: Secondary | ICD-10-CM | POA: Diagnosis not present

## 2014-09-09 DIAGNOSIS — F331 Major depressive disorder, recurrent, moderate: Secondary | ICD-10-CM

## 2014-09-09 DIAGNOSIS — F1420 Cocaine dependence, uncomplicated: Secondary | ICD-10-CM | POA: Diagnosis present

## 2014-09-09 DIAGNOSIS — F1994 Other psychoactive substance use, unspecified with psychoactive substance-induced mood disorder: Secondary | ICD-10-CM | POA: Diagnosis present

## 2014-09-09 DIAGNOSIS — Z720 Tobacco use: Secondary | ICD-10-CM | POA: Insufficient documentation

## 2014-09-09 LAB — CBC WITH DIFFERENTIAL/PLATELET
Basophils Absolute: 0 10*3/uL (ref 0.0–0.1)
Basophils Relative: 0 % (ref 0–1)
EOS ABS: 0 10*3/uL (ref 0.0–0.7)
Eosinophils Relative: 0 % (ref 0–5)
HEMATOCRIT: 44.3 % (ref 39.0–52.0)
HEMOGLOBIN: 15.8 g/dL (ref 13.0–17.0)
Lymphocytes Relative: 22 % (ref 12–46)
Lymphs Abs: 2.1 10*3/uL (ref 0.7–4.0)
MCH: 33.3 pg (ref 26.0–34.0)
MCHC: 35.7 g/dL (ref 30.0–36.0)
MCV: 93.3 fL (ref 78.0–100.0)
MONO ABS: 1.1 10*3/uL — AB (ref 0.1–1.0)
Monocytes Relative: 11 % (ref 3–12)
NEUTROS ABS: 6.1 10*3/uL (ref 1.7–7.7)
Neutrophils Relative %: 67 % (ref 43–77)
PLATELETS: 218 10*3/uL (ref 150–400)
RBC: 4.75 MIL/uL (ref 4.22–5.81)
RDW: 13 % (ref 11.5–15.5)
WBC: 9.3 10*3/uL (ref 4.0–10.5)

## 2014-09-09 LAB — COMPREHENSIVE METABOLIC PANEL
ALT: 32 U/L (ref 0–53)
AST: 62 U/L — AB (ref 0–37)
Albumin: 4.3 g/dL (ref 3.5–5.2)
Alkaline Phosphatase: 68 U/L (ref 39–117)
Anion gap: 10 (ref 5–15)
BILIRUBIN TOTAL: 0.8 mg/dL (ref 0.3–1.2)
BUN: 19 mg/dL (ref 6–23)
CO2: 22 mmol/L (ref 19–32)
Calcium: 8.8 mg/dL (ref 8.4–10.5)
Chloride: 107 mmol/L (ref 96–112)
Creatinine, Ser: 1.24 mg/dL (ref 0.50–1.35)
GFR calc Af Amer: 77 mL/min — ABNORMAL LOW (ref >90)
GFR, EST NON AFRICAN AMERICAN: 67 mL/min — AB (ref >90)
Glucose, Bld: 67 mg/dL — ABNORMAL LOW (ref 70–99)
POTASSIUM: 4.2 mmol/L (ref 3.5–5.1)
Sodium: 139 mmol/L (ref 135–145)
Total Protein: 7.2 g/dL (ref 6.0–8.3)

## 2014-09-09 LAB — RAPID URINE DRUG SCREEN, HOSP PERFORMED
Amphetamines: NOT DETECTED
BENZODIAZEPINES: NOT DETECTED
Barbiturates: NOT DETECTED
COCAINE: POSITIVE — AB
OPIATES: NOT DETECTED
TETRAHYDROCANNABINOL: NOT DETECTED

## 2014-09-09 LAB — ACETAMINOPHEN LEVEL

## 2014-09-09 LAB — CBG MONITORING, ED
GLUCOSE-CAPILLARY: 159 mg/dL — AB (ref 70–99)
Glucose-Capillary: 57 mg/dL — ABNORMAL LOW (ref 70–99)

## 2014-09-09 LAB — SALICYLATE LEVEL: Salicylate Lvl: <4 mg/dL (ref 2.8–20.0)

## 2014-09-09 LAB — ETHANOL: Alcohol, Ethyl (B): 53 mg/dL — ABNORMAL HIGH (ref 0–9)

## 2014-09-09 MED ORDER — ALBUTEROL SULFATE HFA 108 (90 BASE) MCG/ACT IN AERS: 1.0000 | INHALATION_SPRAY | Freq: Four times a day (QID) | RESPIRATORY_TRACT | Status: DC | PRN

## 2014-09-09 MED ORDER — LORAZEPAM 1 MG PO TABS: 1.0000 mg | ORAL_TABLET | Freq: Three times a day (TID) | ORAL | Status: DC | PRN

## 2014-09-09 MED ORDER — LEVETIRACETAM ER 500 MG PO TB24
1500.0000 mg | ORAL_TABLET | Freq: Every day | ORAL | Status: DC
  Administered 2014-09-09: 1500 mg via ORAL
  Filled 2014-09-09: qty 3

## 2014-09-09 MED ORDER — ONDANSETRON HCL 4 MG PO TABS: 4.0000 mg | ORAL_TABLET | Freq: Three times a day (TID) | ORAL | Status: DC | PRN

## 2014-09-09 MED ORDER — NICOTINE 21 MG/24HR TD PT24
21.0000 mg | MEDICATED_PATCH | Freq: Every day | TRANSDERMAL | Status: DC
  Filled 2014-09-09: qty 1

## 2014-09-09 MED ORDER — DEXTROSE 50 % IV SOLN: 1.0000 | Freq: Once | INTRAVENOUS | Status: DC

## 2014-09-09 MED ORDER — RISPERIDONE 2 MG PO TABS
2.0000 mg | ORAL_TABLET | Freq: Every day | ORAL | Status: DC
  Administered 2014-09-09: 2 mg via ORAL
  Filled 2014-09-09: qty 1

## 2014-09-09 MED ORDER — AMLODIPINE BESYLATE 10 MG PO TABS
10.0000 mg | ORAL_TABLET | Freq: Every day | ORAL | Status: DC
  Filled 2014-09-09: qty 1

## 2014-09-09 MED ORDER — ACETAMINOPHEN 325 MG PO TABS: 650.0000 mg | ORAL_TABLET | ORAL | Status: DC | PRN

## 2014-09-09 NOTE — ED Notes (Signed)
[facility] Expected date:  Expected time:  Means of arrival:  Comments: EMS 49yo M / SI

## 2014-09-09 NOTE — ED Notes (Signed)
Pt. And belongings searched and wanded by security. Pt. Has 2 belongings bags and 1 black and red bag. Pt. Has 1 white t-shirt, 1 blue jean pant, 1 white sock, 1 white snickers, 1 black belt, 1 wallet(no money) 1 gray tank top, 1 brown glasses, 1 white cord, 1 gray shirt, and 1 gray underwear. Pt. Belongings locked up at the nurses station in the yellow zone.

## 2014-09-09 NOTE — BH Assessment (Signed)
Assessment completed. Consulted Birdena JubileeIjeoma Nwaze, NP who agrees that pt meets inpatient criteria. TTS will contact other facilities for placement. Antony MaduraKelly Humes, PA-C has been informed of the recommendation.

## 2014-09-09 NOTE — BH Assessment (Signed)
Assessment completed. Consulted Birdena JubileeIjeoma Nwaze, NP who agrees that pt meets inpatient criteria. TTS will contact other facilities for placement.

## 2014-09-09 NOTE — ED Notes (Signed)
Patient lying in bed requesting a sandwich; alert and oriented x4; denies any current SI/HI/AVH; NAD.

## 2014-09-09 NOTE — BH Assessment (Signed)
Binnie RailJoann Glover, [facility], confirmed adult unit is at capacity. Contacted the following facilities for placement:  AT CAPACITY: [facility], per [facility], [facility], per [facility] per [facility], per [facility], per [facility], per [facility], per [facility], per [facility], per [facility], per [facility], per [facility], per [facility], per [facility], per [facility] Cal[facility], per [facility], per [facility], per [facility], per Tess   Harlin RainFord Ellis Patsy BaltimoreWarrick Jr, [facility], [facility] Triage Specialist [phone]

## 2014-09-09 NOTE — ED Provider Notes (Signed)
CSN: [id number]     Arrival date & time 09/09/14  0113 History   First MD Initiated Contact with Patient 09/09/14 0114     Chief Complaint  Patient presents with  . Suicide Attempt  . Altered Mental Status     (Consider location/radiation/quality/duration/timing/severity/associated sxs/prior Treatment) HPI Comments: Patient is a 49 year old male who presents to the emergency department for further evaluation of suicidal thoughts. Patient was on a bus last stop bench and waived a PTAR unit down, stating he was suicidal. Patient was exhibiting a bizarre behavior. PTAR took a CBG which was found to be 54. Patient also had a knife which was taken away from him. Patient states that he doesn't want to live anymore. He does state that he had a razor on him. He doesn't want to hurt anyone else, he just wants to die. He reports binging on crack cocaine over the past 2 days with associated alcohol use. Patient was seen for medical clearance on 09/01/2014 on which date he was discharged.  Patient is a 49 y.o. male presenting with altered mental status. The history is provided by the patient. No language interpreter was used.  Altered Mental Status   Past Medical History  Diagnosis Date  . Hypertension   . Seizure disorder   . Hx of substance abuse   . AVM (arteriovenous malformation)   . Seizures    Past Surgical History  Procedure Laterality Date  . Avm embolization  2013   Family History  Problem Relation Age of Onset  . Hypertension Father   . Coronary artery disease Father   . Alcohol abuse Father   . Diabetes Mother   . Hypertension Mother   . Coronary artery disease Brother   . Healthy Brother   . Coronary artery disease Brother   . Drug abuse Brother   . Drug abuse Brother    History  Substance Use Topics  . Smoking status: Current Every Day Smoker -- 0.50 packs/day    Types: Cigarettes  . Smokeless tobacco: Never Used  . Alcohol Use: 3.6 - 7.2 oz/week    6-12 Cans of beer  per week     Comment: Unsure of amount / pt. states he drank $150 worth ETOH in 2 days    Review of Systems  Psychiatric/Behavioral: Positive for suicidal ideas and behavioral problems.  All other systems reviewed and are negative.   Allergies  Latex  Home Medications   Prior to Admission medications   Medication Sig Start Date End Date Taking? Authorizing Provider  amLODipine (NORVASC) 10 MG tablet Take 1 tablet (10 mg total) by mouth daily. 08/03/14  Yes Adonis Brook, NP  B Complex-Biotin-FA (SUPER B-50 COMPLEX) CAPS Take 1 capsule by mouth daily. 08/03/14  Yes Adonis Brook, NP  Calcium-Magnesium (CALCIUM MAGNESIUM 750) 300-300 MG TABS Take 1 tablet by mouth daily. 08/03/14  Yes Adonis Brook, NP  levETIRAcetam 750 MG TB24 Take 2 tablets (1,500 mg total) by mouth at bedtime. 08/03/14  Yes Adonis Brook, NP  mirtazapine (REMERON) 30 MG tablet Take 1 tablet (30 mg total) by mouth at bedtime. 08/03/14  Yes Adonis Brook, NP  Multiple Vitamin (MULTIVITAMIN WITH MINERALS) TABS tablet Take 1 tablet by mouth daily. 08/03/14  Yes Adonis Brook, NP  risperiDONE (RISPERDAL) 2 MG tablet Take 1 tablet (2 mg total) by mouth at bedtime. 08/03/14  Yes Adonis Brook, NP  thiamine 100 MG tablet Take 1 tablet (100 mg total) by mouth daily. 08/03/14  Yes Adonis Brook, NP  tiotropium (SPIRIVA) 18 MCG inhalation capsule Place 1 capsule (18 mcg total) into inhaler and inhale daily as needed (for shortness of breath). 08/03/14  Yes Adonis BrookSheila Agustin, NP  albuterol (PROVENTIL HFA;VENTOLIN HFA) 108 (90 BASE) MCG/ACT inhaler Inhale 1-2 puffs into the lungs every 6 (six) hours as needed for wheezing or shortness of breath. 08/03/14   Adonis BrookSheila Agustin, NP   BP 124/51 mmHg  Pulse 106  Temp(Src) 98.1 F (36.7 C) (Oral)  Resp 18  SpO2 94%   Physical Exam  Constitutional: He is oriented to person, place, and time. He appears well-developed and well-nourished. No distress.  HENT:  Head: Normocephalic and atraumatic.  Eyes:  Conjunctivae and EOM are normal. No scleral icterus.  Neck: Normal range of motion.  Pulmonary/Chest: Effort normal. No respiratory distress.  Musculoskeletal: Normal range of motion.  Neurological: He is alert and oriented to person, place, and time. He exhibits normal muscle tone. Coordination normal.  GCS 15. Patient is alert and oriented. He answers questions appropriately and follows simple commands. Speech is goal oriented.  Skin: Skin is warm and dry. No rash noted. He is not diaphoretic. No erythema. No pallor.  Psychiatric: He is withdrawn. Cognition and memory are normal. He exhibits a depressed mood. He expresses suicidal ideation. He expresses no homicidal ideation. He expresses suicidal plans. He expresses no homicidal plans.  Nursing note and vitals reviewed.   ED Course  Procedures (including critical care time) Labs Review Labs Reviewed  CBC WITH DIFFERENTIAL/PLATELET - Abnormal; Notable for the following:    Monocytes Absolute 1.1 (*)    All other components within normal limits  COMPREHENSIVE METABOLIC PANEL - Abnormal; Notable for the following:    Glucose, Bld 67 (*)    AST 62 (*)    GFR calc non Af Amer 67 (*)    GFR calc Af Amer 77 (*)    All other components within normal limits  ETHANOL - Abnormal; Notable for the following:    Alcohol, Ethyl (B) 53 (*)    All other components within normal limits  ACETAMINOPHEN LEVEL - Abnormal; Notable for the following:    Acetaminophen (Tylenol), Serum <10.0 (*)    All other components within normal limits  CBG MONITORING, ED - Abnormal; Notable for the following:    Glucose-Capillary 57 (*)    All other components within normal limits  CBG MONITORING, ED - Abnormal; Notable for the following:    Glucose-Capillary 159 (*)    All other components within normal limits  SALICYLATE LEVEL  URINE RAPID DRUG SCREEN (HOSP PERFORMED)    Imaging Review No results found.   EKG Interpretation None      MDM   Final  diagnoses:  Suicidal ideation  Major depressive disorder, recurrent episode, moderate    49 year old male presents to the emergency department for suicidal ideations. He was initially hypoglycemic on arrival with a CABG at 2657. This improved after patient drank juice and ate a sandwich. Repeat CBG 159. Patient medically cleared and evaluated by TTS. He meets criteria for inpatient treatment for his depression and suicidal ideations. Placement is currently pending. Disposition to be determined by oncoming ED provider.   Filed Vitals:   09/09/14 0123 09/09/14 0444  BP: 121/40 124/51  Pulse: 110 106  Temp: 97.9 F (36.6 C) 98.1 F (36.7 C)  TempSrc: Oral Oral  Resp: 16 18  SpO2: 91% 94%     Antony MaduraKelly Arlie Riker, PA-C 09/09/14 0523  Paula LibraJohn Molpus, MD 09/09/14 [phone]

## 2014-09-09 NOTE — ED Notes (Signed)
Pt is suicidal; [place] [facility] [address] [facility] on [facility] bus stop bench; pt admitted to taking crack and ETOH; pt waived a PTAR unit down and said he was suicidal, pts behavior was bizarre; CBG of 54 mg/dl; took knife away from pt and [facility] was called; pt was slumped over drooling from side of mouth with continued bizarre behavior. VS: 142/95, pulse 109, 12.5 grams of D50 given, 4 mg of Zofran given by [facility]

## 2014-09-09 NOTE — ED Notes (Signed)
Pt. Made aware for the need of urine. 

## 2014-09-09 NOTE — Consult Note (Signed)
[facility]   Reason for Consult:  Cocaine abuse, suicidal ideations Referring Physician:  EDP Patient Identification: Corky SingDavid Melody MRN:  [id number] Principal Diagnosis: Suicidal ideation Diagnosis:   Patient Active Problem List   Diagnosis Date Noted  . Alcohol abuse [F10.10] 09/09/2014    Priority: High  . Substance induced mood disorder [F19.94] 09/09/2014    Priority: High  . Cocaine use disorder, severe, dependence [F14.20] 09/07/2014    Priority: High  . Suicidal ideation [R45.851]     Priority: High  . Seizures [R56.9] 02/09/2013  . Cerebral AVM [Q28.2] 02/09/2013    Total Time spent with patient: 45 minutes  Subjective:   Corky SingDavid Calica is a 49 y.o. male patient admitted with depression and suicidal ideations/plan.  HPI:  [name] reporting suicidal ideations. Pt stated "I don't want to live anymore". "I had a razor earlier because drugs didn't do the trick; I gotta think of something else". Pt reported that he has attempted suicide in the past and [facility]. Pt stated "I need long term treatment". Pt is currently not receiving any mental health treatment at this time. When pt was asked about stressors pt stated "I don't have no job or wife; I don't have nothing". "What's the point, nobody cares". Pt did not respond to the depression questionnaire; however he shared that his sleep and appetite has decreased. Pt denies HI and AVH at this time. Pt denied having access to weapons or firearms and stated "they took my razor". Pt reported that he uses crack/cocaine and drinks alcohol daily. Pt did not report any physical, sexual or emotional abuse at this time.  HPI Elements:   Location:  generalized. Quality:  acute. Severity:  severe. Timing:  constant. Duration:  few days. Context:  stressors.  Past Medical History:  Past Medical History  Diagnosis Date  . Hypertension   . Seizure disorder   . Hx of substance abuse   . AVM  (arteriovenous malformation)   . Seizures     Past Surgical History  Procedure Laterality Date  . Avm embolization  2013   Family History:  Family History  Problem Relation Age of Onset  . Hypertension Father   . Coronary artery disease Father   . Alcohol abuse Father   . Diabetes Mother   . Hypertension Mother   . Coronary artery disease Brother   . Healthy Brother   . Coronary artery disease Brother   . Drug abuse Brother   . Drug abuse Brother    Social History:  History  Alcohol Use  . 3.6 - 7.2 oz/week  . 6-12 Cans of beer per week    Comment: Unsure of amount / pt. states he drank $150 worth ETOH in 2 days     History  Drug Use  . Yes    Comment: Unsure of amount /states $700 worth in 2 days    History   Social History  . Marital Status: Single    Spouse Name: Vikki PortsValerie  . Number of Children: 3  . Years of Education: GED    Occupational History  . Unemployed    Social History Main Topics  . Smoking status: Current Every Day Smoker -- 0.50 packs/day    Types: Cigarettes  . Smokeless tobacco: Never Used  . Alcohol Use: 3.6 - 7.2 oz/week    6-12 Cans of beer per week     Comment: Unsure of amount / pt. states he drank $150 worth ETOH in  2 days  . Drug Use: Yes     Comment: Unsure of amount /states $700 worth in 2 days  . Sexual Activity: Yes    Birth Control/ Protection: None   Other Topics Concern  . None   Social History Narrative   Patient lives at home with [name]). Patient works full time time.   Education 12 th grade.   Caffeine two cups of coffee daily. Three mountain dews daily.   Right handed.   Additional Social History:    Pain Medications: Pt denies Prescriptions: Pt denies Over the Counter: Pt denies History of alcohol / drug use?: Yes Longest period of sobriety (when/how long): 2 years  Negative Consequences of Use: Financial, Personal relationships, Work / School Name of Substance 1: Cocaine 1 - Age of First Use: Unknown 1 -  Amount (size/oz): Unknown 1 - Frequency: Daily 1 - Duration: ongoing 1 - Last Use / Amount: 09-08-14 Name of Substance 2: Alcohol 2 - Age of First Use: Unknown 2 - Frequency: Daily 2 - Duration: ongoing 2 - Last Use / Amount: 09-08-14                 Allergies:   Allergies  Allergen Reactions  . Latex Rash    Vitals: Blood pressure 95/39, pulse 82, temperature 97.1 F (36.2 C), temperature source Oral, resp. rate 18, SpO2 100 %.  Risk to Self: Suicidal Ideation: Yes-Currently Present Suicidal Intent: Yes-Currently Present Is patient at risk for suicide?: Yes Suicidal Plan?: Yes-Currently Present Specify Current Suicidal Plan: Razor blade Access to Means: Yes Specify Access to Suicidal Means: Pt reported that he had a razor blade in his possession.  What has been your use of drugs/alcohol within the last 12 months?: Daily drugs and alcohol use reported.  How many times?: 2 Other Self Harm Risks: No other self harm risk identified at this time.  Triggers for Past Attempts: Unknown Intentional Self Injurious Behavior: None Risk to Others: Homicidal Ideation: No Thoughts of Harm to Others: No Current Homicidal Intent: No Current Homicidal Plan: No Access to Homicidal Means: No Identified Victim: NA History of harm to others?: No Assessment of Violence: On admission Violent Behavior Description: No violent behaviors observed. Pt is calm and cooperative at this time.  Does patient have access to weapons?: No Criminal Charges Pending?: No Does patient have a court date: No Prior Inpatient Therapy: Prior Inpatient Therapy: Yes Prior Therapy Dates: 2015; 2016 Prior Therapy Facilty/Provider(s): [facility]; [facility] Reason for Treatment: Alcohol Prior Outpatient Therapy: Prior Outpatient Therapy: No Prior Therapy Dates: NA Prior Therapy Facilty/Provider(s): NA Reason for Treatment: NA  Current Facility-Administered Medications  Medication Dose Route Frequency Provider Last  Rate Last Dose  . acetaminophen (TYLENOL) tablet 650 mg  650 mg Oral Q4H PRN Antony Madura, PA-C      . albuterol (PROVENTIL HFA;VENTOLIN HFA) 108 (90 BASE) MCG/ACT inhaler 1-2 puff  1-2 puff Inhalation Q6H PRN Charm Rings, NP      . amLODipine (NORVASC) tablet 10 mg  10 mg Oral Daily Charm Rings, NP      . levETIRAcetam (KEPPRA XR) 24 hr tablet 1,500 mg  1,500 mg Oral QHS Charm Rings, NP      . LORazepam (ATIVAN) tablet 1 mg  1 mg Oral Q8H PRN Antony Madura, PA-C      . nicotine (NICODERM CQ - dosed in mg/24 hours) patch 21 mg  21 mg Transdermal Daily Antony Madura, PA-C   21 mg at 09/09/14  1033  . ondansetron (ZOFRAN) tablet 4 mg  4 mg Oral Q8H PRN Antony Madura, PA-C      . risperiDONE (RISPERDAL) tablet 2 mg  2 mg Oral QHS Charm Rings, NP       Current Outpatient Prescriptions  Medication Sig Dispense Refill  . amLODipine (NORVASC) 10 MG tablet Take 1 tablet (10 mg total) by mouth daily. 30 tablet 0  . B Complex-Biotin-FA (SUPER B-50 COMPLEX) CAPS Take 1 capsule by mouth daily.    . Calcium-Magnesium (CALCIUM MAGNESIUM 750) 300-300 MG TABS Take 1 tablet by mouth daily.  0  . levETIRAcetam 750 MG TB24 Take 2 tablets (1,500 mg total) by mouth at bedtime. 60 tablet 0  . mirtazapine (REMERON) 30 MG tablet Take 1 tablet (30 mg total) by mouth at bedtime. 30 tablet 0  . Multiple Vitamin (MULTIVITAMIN WITH MINERALS) TABS tablet Take 1 tablet by mouth daily. 30 tablet 0  . risperiDONE (RISPERDAL) 2 MG tablet Take 1 tablet (2 mg total) by mouth at bedtime. 30 tablet 0  . thiamine 100 MG tablet Take 1 tablet (100 mg total) by mouth daily. 30 tablet 0  . tiotropium (SPIRIVA) 18 MCG inhalation capsule Place 1 capsule (18 mcg total) into inhaler and inhale daily as needed (for shortness of breath). 30 capsule 12  . albuterol (PROVENTIL HFA;VENTOLIN HFA) 108 (90 BASE) MCG/ACT inhaler Inhale 1-2 puffs into the lungs every 6 (six) hours as needed for wheezing or shortness of breath.       Musculoskeletal: Strength & Muscle Tone: within normal limits Gait & Station: normal Patient leans: N/A  Psychiatric Specialty Exam:     Blood pressure 95/39, pulse 82, temperature 97.1 F (36.2 C), temperature source Oral, resp. rate 18, SpO2 100 %.There is no weight on file to calculate BMI.  General Appearance: Casual  Eye Contact::  Fair  Speech:  Normal Rate  Volume:  Decreased  Mood:  Depressed  Affect:  Congruent  Thought Process:  Coherent  Orientation:  Full (Time, Place, and Person)  Thought Content:  WDL  Suicidal Thoughts:  Yes.  with intent/plan  Homicidal Thoughts:  No  Memory:  Immediate;   Fair Recent;   Fair Remote;   Fair  Judgement:  Fair  Insight:  Fair  Psychomotor Activity:  Decreased  Concentration:  Fair  Recall:  [employer] of Knowledge:Fair  Language: Fair  Akathisia:  No  Handed:  Right  AIMS (if indicated):     Assets:  Leisure Time Physical Health Resilience Social Support  ADL's:  Intact  Cognition: WNL  Sleep:      Medical Decision Making: Review of Psycho-Social Stressors (1), Review or order clinical lab tests (1) and Review of Medication Regimen & Side Effects (2)  Treatment Plan Summary: Daily contact with patient to assess and evaluate symptoms and progress in treatment, Medication management and Plan Admit to inpatient at [facility] for stabilization  Plan:  Recommend psychiatric Inpatient admission when medically cleared. Disposition: Eloise Levels, PMH-NP 09/09/2014 2:36 PM  Patient seen and I agree with assessment and plan  Diannia Ruder M.D.

## 2014-09-09 NOTE — ED Notes (Signed)
Pt given a sandwich and 2 apple juices.  °

## 2014-09-09 NOTE — ED Notes (Signed)
Assumed care of patient, patient sleeping, remains in hall C bed.

## 2014-09-09 NOTE — BH Assessment (Signed)
Tele Assessment Note   Samuel Atkinson is an 49 y.o. male presenting to [facility] reporting suicidal ideations. Pt stated "I don't want to live anymore". "I had a razor earlier because drugs didn't do the trick; I gotta think of something else". Pt reported that he has attempted suicide in the past and was recently discharged from [facility]. Pt stated "I need long term treatment". Pt is currently not receiving any mental health treatment at this time.  When pt was asked about stressors pt stated "I don't have no job or wife; I don't have nothing". "What's the point, nobody cares". Pt did not respond to the depression questionnaire; however he shared that his sleep and appetite has decreased. Pt denies HI and AVH at this time. Pt denied having access to weapons or firearms and stated "they took my razor". Pt reported that he uses crack/cocaine and drinks alcohol daily. Pt did not report any physical, sexual or emotional abuse at this time.  Pt is drowsy and oriented to person, place and situation. Pt is calm and cooperative at this time. Pt maintained poor eye contact throughout this assessment.  Pt mood is depressed and sad; affect congruent with mood. Pt speech is soft. Thought process is logical and coherent. Inpatient treatment is recommended.   Axis I: Major Depression, Recurrent severe and Cocaine use disorder, severe, dependence; alcohol use disorder, moderate   Past Medical History:  Past Medical History  Diagnosis Date  . Hypertension   . Seizure disorder   . Hx of substance abuse   . AVM (arteriovenous malformation)   . Seizures     Past Surgical History  Procedure Laterality Date  . Avm embolization  2013    Family History:  Family History  Problem Relation Age of Onset  . Hypertension Father   . Coronary artery disease Father   . Alcohol abuse Father   . Diabetes Mother   . Hypertension Mother   . Coronary artery disease Brother   . Healthy Brother   . Coronary artery disease Brother    . Drug abuse Brother   . Drug abuse Brother     Social History:  reports that he has been smoking Cigarettes.  He has been smoking about 0.50 packs per day. He has never used smokeless tobacco. He reports that he drinks about 3.6 - 7.2 oz of alcohol per week. He reports that he uses illicit drugs.  Additional Social History:  Alcohol / Drug Use Pain Medications: Pt denies Prescriptions: Pt denies Over the Counter: Pt denies History of alcohol / drug use?: Yes Longest period of sobriety (when/how long): 2 years  Negative Consequences of Use: Financial, Personal relationships, Work / School Substance #1 Name of Substance 1: Cocaine 1 - Age of First Use: Unknown 1 - Amount (size/oz): Unknown 1 - Frequency: Daily 1 - Duration: ongoing 1 - Last Use / Amount: 09-08-14 Substance #2 Name of Substance 2: Alcohol 2 - Age of First Use: Unknown 2 - Frequency: Daily 2 - Duration: ongoing 2 - Last Use / Amount: 09-08-14  CIWA: CIWA-Ar BP: (!) 121/40 mmHg Pulse Rate: 110 COWS:    PATIENT STRENGTHS: (choose at least two) Average or above average intelligence Motivation for treatment/growth  Allergies:  Allergies  Allergen Reactions  . Latex Rash    Home Medications:  (Not in a hospital admission)  OB/GYN Status:  No LMP for male patient.  General Assessment Data Location of Assessment: [facility] Is this a Tele or Face-to-Face Assessment?: Face-to-Face  Is this an Initial Assessment or a Re-assessment for this encounter?: Initial Assessment Living Arrangements: Spouse/significant other Can pt return to current living arrangement?: Yes Admission Status: Voluntary Is patient capable of signing voluntary admission?: Yes Transfer from: Home Referral Source: Self/Family/Friend     [facility] Living Arrangements: Spouse/significant other Name of Psychiatrist: No provider reported at this time.  Name of Therapist: No provider reported at this time.   Education Status Is  patient currently in school?: No Current Grade: NA Highest grade of school patient has completed: NA Name of school: NA Contact person: NA  Risk to self with the past 6 months Suicidal Ideation: Yes-Currently Present Suicidal Intent: Yes-Currently Present Is patient at risk for suicide?: Yes Suicidal Plan?: Yes-Currently Present Specify Current Suicidal Plan: Razor blade Access to Means: Yes Specify Access to Suicidal Means: Pt reported that he had a razor blade in his possession.  What has been your use of drugs/alcohol within the last 12 months?: Daily drugs and alcohol use reported.  Previous Attempts/Gestures: Yes How many times?: 2 Other Self Harm Risks: No other self harm risk identified at this time.  Triggers for Past Attempts: Unknown Intentional Self Injurious Behavior: None Family Suicide History: No Recent stressful life event(s): Job Loss, Other (Comment) (Relationship stressors ) Persecutory voices/beliefs?: No Depression: Yes Depression Symptoms:  (Pt would not report. ) Substance abuse history and/or treatment for substance abuse?: Yes Suicide prevention information given to non-admitted patients: Not applicable  Risk to Others within the past 6 months Homicidal Ideation: No Thoughts of Harm to Others: No Current Homicidal Intent: No Current Homicidal Plan: No Access to Homicidal Means: No Identified Victim: NA History of harm to others?: No Assessment of Violence: On admission Violent Behavior Description: No violent behaviors observed. Pt is calm and cooperative at this time.  Does patient have access to weapons?: No Criminal Charges Pending?: No Does patient have a court date: No  Psychosis Hallucinations: None noted Delusions: None noted  Mental Status Report Appear/Hygiene: Unremarkable Eye Contact: Poor Motor Activity: Freedom of movement Speech: Logical/coherent, Soft Level of Consciousness: Drowsy Mood: Sad, Depressed Affect: Appropriate to  circumstance Anxiety Level: None Thought Processes: Coherent, Relevant Judgement: Partial Orientation: Appropriate for developmental age Obsessive Compulsive Thoughts/Behaviors: None  Cognitive Functioning Concentration: Unable to Assess Memory: Recent Intact IQ: Average Insight: Poor Impulse Control: Poor Appetite: Poor Weight Loss: 0 Weight Gain: 0 Sleep: Decreased Total Hours of Sleep: 4 Vegetative Symptoms: Unable to Assess  ADLScreening [facility] Assessment Services) Patient's cognitive ability adequate to safely complete daily activities?: Yes Patient able to express need for assistance with ADLs?: Yes Independently performs ADLs?: Yes (appropriate for developmental age)  Prior Inpatient Therapy Prior Inpatient Therapy: Yes Prior Therapy Dates: 2015; 2016 [facility] Reason for Treatment: Alcohol  Prior Outpatient Therapy Prior Outpatient Therapy: No Prior Therapy Dates: NA Prior Therapy Facilty/Provider(s): NA Reason for Treatment: NA  ADL Screening (condition at time of admission) Patient's cognitive ability adequate to safely complete daily activities?: Yes Is the patient deaf or have difficulty hearing?: No Does the patient have difficulty seeing, even when wearing glasses/contacts?: No Does the patient have difficulty concentrating, remembering, or making decisions?: No Patient able to express need for assistance with ADLs?: Yes Does the patient have difficulty dressing or bathing?: No Independently performs ADLs?: Yes (appropriate for developmental age)       Abuse/Neglect Assessment (Assessment to be complete while patient is alone) Physical Abuse: Denies Verbal Abuse: Denies Sexual Abuse:  Denies Exploitation of patient/patient's resources: Denies Self-Neglect: Denies          Additional Information 1:1 In Past 12 Months?: No CIRT Risk: No Elopement Risk: No Does patient have medical clearance?: Yes      Disposition:  Disposition Initial Assessment Completed for this Encounter: Yes Type of inpatient treatment program:  (Pt has been accepted to [facility] Room 505 Bed 1. )  Breniya Goertzen S 09/09/2014 3:26 AM

## 2014-09-09 NOTE — ED Notes (Addendum)
Pt transferred to 41. Report called to [place].

## 2014-09-10 ENCOUNTER — Encounter (HOSPITAL_COMMUNITY): Payer: Self-pay | Admitting: *Deleted

## 2014-09-10 ENCOUNTER — Inpatient Hospital Stay (HOSPITAL_COMMUNITY)
Admission: AD | Admit: 2014-09-10 | Discharge: 2014-09-12 | DRG: 881 | Disposition: A | Discharge status: Home / Self Care | Payer: PRIVATE HEALTH INSURANCE | Source: Intra-hospital | Attending: Emergency Medicine | Admitting: Emergency Medicine

## 2014-09-10 DIAGNOSIS — F332 Major depressive disorder, recurrent severe without psychotic features: Secondary | ICD-10-CM | POA: Diagnosis not present

## 2014-09-10 DIAGNOSIS — G4710 Hypersomnia, unspecified: Secondary | ICD-10-CM | POA: Diagnosis present

## 2014-09-10 DIAGNOSIS — I10 Essential (primary) hypertension: Secondary | ICD-10-CM | POA: Diagnosis present

## 2014-09-10 DIAGNOSIS — F1020 Alcohol dependence, uncomplicated: Secondary | ICD-10-CM | POA: Diagnosis present

## 2014-09-10 DIAGNOSIS — Z833 Family history of diabetes mellitus: Secondary | ICD-10-CM

## 2014-09-10 DIAGNOSIS — F1420 Cocaine dependence, uncomplicated: Secondary | ICD-10-CM | POA: Diagnosis present

## 2014-09-10 DIAGNOSIS — M7989 Other specified soft tissue disorders: Secondary | ICD-10-CM | POA: Diagnosis present

## 2014-09-10 DIAGNOSIS — Z590 Homelessness: Secondary | ICD-10-CM | POA: Diagnosis not present

## 2014-09-10 DIAGNOSIS — F17210 Nicotine dependence, cigarettes, uncomplicated: Secondary | ICD-10-CM | POA: Diagnosis present

## 2014-09-10 DIAGNOSIS — Z811 Family history of alcohol abuse and dependence: Secondary | ICD-10-CM

## 2014-09-10 DIAGNOSIS — F1410 Cocaine abuse, uncomplicated: Secondary | ICD-10-CM | POA: Diagnosis not present

## 2014-09-10 DIAGNOSIS — M79641 Pain in right hand: Secondary | ICD-10-CM

## 2014-09-10 DIAGNOSIS — F329 Major depressive disorder, single episode, unspecified: Secondary | ICD-10-CM | POA: Diagnosis present

## 2014-09-10 DIAGNOSIS — Z8249 Family history of ischemic heart disease and other diseases of the circulatory system: Secondary | ICD-10-CM

## 2014-09-10 DIAGNOSIS — F1994 Other psychoactive substance use, unspecified with psychoactive substance-induced mood disorder: Secondary | ICD-10-CM | POA: Diagnosis present

## 2014-09-10 DIAGNOSIS — R45851 Suicidal ideations: Secondary | ICD-10-CM | POA: Diagnosis present

## 2014-09-10 MED ORDER — LORAZEPAM 1 MG PO TABS: 1.0000 mg | ORAL_TABLET | Freq: Two times a day (BID) | ORAL | Status: DC

## 2014-09-10 MED ORDER — ACETAMINOPHEN 325 MG PO TABS
650.0000 mg | ORAL_TABLET | Freq: Four times a day (QID) | ORAL | Status: DC | PRN
  Administered 2014-09-10: 650 mg via ORAL
  Filled 2014-09-10: qty 2

## 2014-09-10 MED ORDER — LOPERAMIDE HCL 2 MG PO CAPS: 2.0000 mg | ORAL_CAPSULE | ORAL | Status: DC | PRN

## 2014-09-10 MED ORDER — TRAZODONE HCL 50 MG PO TABS
50.0000 mg | ORAL_TABLET | Freq: Every evening | ORAL | Status: DC | PRN
  Administered 2014-09-10 – 2014-09-11 (×2): 50 mg via ORAL
  Filled 2014-09-10 (×2): qty 1

## 2014-09-10 MED ORDER — ADULT MULTIVITAMIN W/MINERALS CH
1.0000 | ORAL_TABLET | Freq: Every day | ORAL | Status: DC
  Administered 2014-09-10: 1 via ORAL
  Filled 2014-09-10 (×2): qty 1

## 2014-09-10 MED ORDER — MAGNESIUM HYDROXIDE 400 MG/5ML PO SUSP: 30.0000 mL | Freq: Every day | ORAL | Status: DC | PRN

## 2014-09-10 MED ORDER — AMLODIPINE BESYLATE 10 MG PO TABS
10.0000 mg | ORAL_TABLET | Freq: Every day | ORAL | Status: DC
  Administered 2014-09-10 – 2014-09-12 (×3): 10 mg via ORAL
  Filled 2014-09-10 (×5): qty 1

## 2014-09-10 MED ORDER — TIOTROPIUM BROMIDE MONOHYDRATE 18 MCG IN CAPS: 18.0000 ug | ORAL_CAPSULE | Freq: Every day | RESPIRATORY_TRACT | Status: DC | PRN

## 2014-09-10 MED ORDER — LORAZEPAM 1 MG PO TABS: 1.0000 mg | ORAL_TABLET | Freq: Every day | ORAL | Status: DC

## 2014-09-10 MED ORDER — LORAZEPAM 1 MG PO TABS
1.0000 mg | ORAL_TABLET | Freq: Four times a day (QID) | ORAL | Status: AC
  Administered 2014-09-10 – 2014-09-11 (×4): 1 mg via ORAL
  Filled 2014-09-10 (×4): qty 1

## 2014-09-10 MED ORDER — LORAZEPAM 1 MG PO TABS
1.0000 mg | ORAL_TABLET | Freq: Three times a day (TID) | ORAL | Status: AC
  Administered 2014-09-11 – 2014-09-12 (×3): 1 mg via ORAL
  Filled 2014-09-10 (×3): qty 1

## 2014-09-10 MED ORDER — ALBUTEROL SULFATE HFA 108 (90 BASE) MCG/ACT IN AERS: 1.0000 | INHALATION_SPRAY | Freq: Four times a day (QID) | RESPIRATORY_TRACT | Status: DC | PRN

## 2014-09-10 MED ORDER — LORAZEPAM 1 MG PO TABS
1.0000 mg | ORAL_TABLET | Freq: Four times a day (QID) | ORAL | Status: DC | PRN
  Administered 2014-09-11: 1 mg via ORAL
  Filled 2014-09-10: qty 1

## 2014-09-10 MED ORDER — THIAMINE HCL 100 MG/ML IJ SOLN
100.0000 mg | Freq: Once | INTRAMUSCULAR | Status: AC
  Administered 2014-09-10: 100 mg via INTRAMUSCULAR
  Filled 2014-09-10: qty 2

## 2014-09-10 MED ORDER — ALUM & MAG HYDROXIDE-SIMETH 200-200-20 MG/5ML PO SUSP: 30.0000 mL | ORAL | Status: DC | PRN

## 2014-09-10 MED ORDER — VITAMIN B-1 100 MG PO TABS
100.0000 mg | ORAL_TABLET | Freq: Every day | ORAL | Status: DC
  Administered 2014-09-11 – 2014-09-12 (×2): 100 mg via ORAL
  Filled 2014-09-10 (×4): qty 1

## 2014-09-10 MED ORDER — ADULT MULTIVITAMIN W/MINERALS CH
1.0000 | ORAL_TABLET | Freq: Every day | ORAL | Status: DC
  Administered 2014-09-10 – 2014-09-12 (×3): 1 via ORAL
  Filled 2014-09-10 (×5): qty 1

## 2014-09-10 MED ORDER — ONDANSETRON 4 MG PO TBDP: 4.0000 mg | ORAL_TABLET | Freq: Four times a day (QID) | ORAL | Status: DC | PRN

## 2014-09-10 MED ORDER — HYDROXYZINE HCL 25 MG PO TABS
25.0000 mg | ORAL_TABLET | Freq: Four times a day (QID) | ORAL | Status: DC | PRN
  Filled 2014-09-10: qty 1

## 2014-09-10 NOTE — [facility] Suicide Risk Assessment (Signed)
[facility]   Nursing information obtained from:    Demographic factors:    Current Mental Status:    Loss Factors:    Historical Factors:    Risk Reduction Factors:    Total Time spent with patient: 30 minutes Principal Problem: <principal problem not specified> Diagnosis:   Patient Active Problem List   Diagnosis Date Noted  . Major depressive disorder [F32.2] 09/10/2014  . Cocaine abuse [F14.10] 09/10/2014  . Alcohol dependence [F10.20] 09/10/2014  . Alcohol abuse [F10.10] 09/09/2014  . Substance induced mood disorder [F19.94] 09/09/2014  . Cocaine use disorder, severe, dependence [F14.20] 09/07/2014  . Suicidal ideation [R45.851]   . Seizures [R56.9] 02/09/2013  . Cerebral AVM [Q28.2] 02/09/2013     Continued Clinical Symptoms:  Alcohol Use Disorder Identification Test Final Score (AUDIT): 12 The "Alcohol Use Disorders Identification Test", Guidelines for Use in Primary Care, Second Edition.  World Science writerHealth Organization [facility](WHO). Score between 0-7:  no or low risk or alcohol related problems. Score between 8-15:  moderate risk of alcohol related problems. Score between 16-19:  high risk of alcohol related problems. Score 20 or above:  warrants further diagnostic evaluation for alcohol dependence and treatment.   CLINICAL FACTORS:   Depression:   Comorbid alcohol abuse/dependence Alcohol/Substance Abuse/Dependencies  Psychiatric Specialty Exam: Physical Exam  ROS  Blood pressure 108/64, pulse 96, temperature 98 F (36.7 C), temperature source Oral, resp. rate 16, height 5\' 11"  (1.803 m), weight 73.936 kg (163 lb), SpO2 99 %.Body mass index is 22.74 kg/(m^2).  COGNITIVE FEATURES THAT CONTRIBUTE TO RISK:  Closed-mindedness, Polarized thinking and Thought constriction (tunnel vision)    SUICIDE RISK:   Moderate:  Frequent suicidal ideation with limited intensity, and duration, some specificity in terms of plans, no associated intent, good  self-control, limited dysphoria/symptomatology, some risk factors present, and identifiable protective factors, including available and accessible social support.  PLAN OF CARE: Supportive approach/coping skills/relapse prevention                               Alcohol detox protocol                                Reassess for antidepressants and or mood stabilizers                               Explore residential treatment options Medical Decision Making:  Review of Psycho-Social Stressors (1), Review or order clinical lab tests (1), Review of Medication Regimen & Side Effects (2) and Review of New Medication or Change in Dosage (2)  I certify that inpatient services furnished can reasonably be expected to improve the patient's condition.   Jeaninne Lodico A 09/10/2014, 6:11 PM

## 2014-09-10 NOTE — BHH Counselor (Deleted)
Adult Comprehensive Assessment  Patient ID: Samuel Atkinson, male DOB: 07/31/1965, 49 y.o. 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, petroleum" Financial / Lack of resources (include bankruptcy): "No income" Housing: "Homeless"  Physical Health: NA Social Relationships: Isolating Substance abuse: recent relapse on alcohol and cocaine Bereavement: NA  Living/Environment/Situation:  Living Arrangements: Homeless Living conditions (as described by patient or guardian):  How long has patient lived in current situation?: 2 weeks or more What is atmosphere in current home: Dangerous  Family History:  Marital status: Married Number of Years Married: 2 What types of issues is patient dealing with in the relationship?: Pt unable to return home Additional relationship information: N/A Does patient have children?: Yes How many children?: 3 How is patient's relationship with their children?: adult children - reports being close to them but "they would be better off without me"  Childhood History:  By whom was/is the patient raised?: Both parents Additional childhood history information: Pt describes his childhood as "it sucked". Pt states that his father was a Haematologistwomanizer and alcoholic. Upset parent divorced after 34 years of marriage.  Description of patient's relationship with caregiver when they were a child: Pt reports being close to his parents growing up for the most part.  Patient's description of current relationship with people who raised him/her: Pt reports strained relationship with family now due to his history of substance use Does patient have siblings?: Yes Number of Siblings: 8 Description of patient's current relationship with siblings: 6 deceased, one living twin brother he is very close to, 1 living  sister Did patient suffer any verbal/emotional/physical/sexual abuse as a child?: No Did patient suffer from severe childhood neglect?: No Has patient ever been sexually abused/assaulted/raped as an adolescent or adult?: No Was the patient ever a victim of a crime or a disaster?: No Witnessed domestic violence?: No Has patient been effected by domestic violence as an adult?: No  Education:  Highest grade of school patient has completed: 12 Currently a student?: No Name of school: N/A Learning disability?: No  Employment/Work Situation:  Employment situation: Unemployed Patient's job has been impacted by current illness: No What is the longest time patient has a held a job?: "years" 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 Patient vague as to amounts used yet reports he doesn't run out, does black out and once he starts no matter what time of day he continues using until he passes out If attempted suicide, did drugs/alcohol play a role in this?: No Alcohol/Substance Abuse Treatment Hx: Past Tx, Inpatient, Attends AA/NA If yes, describe treatment: [facility] Has alcohol/substance abuse ever caused legal problems?: No  Social Support System:  [employer] CorporationPatient's Community Support System: Good usually, just not right now with the wife Describe Community Support System: Pt reports his family is very supportive Type of faith/religion: Ephriam KnucklesChristian How does patient's faith help to cope with current illness? Nothing is helping, I have no faith right now  Leisure/Recreation:  Leisure and Hobbies:  Nothing  Strengths/Needs:  What things does the patient do well?: Good worker In what areas does patient struggle / problems for patient: Depression, substance use, housing  Discharge Plan:  Does patient have access to transportation?: No Plan for no access to transportation at discharge: Bus voucher Will patient be returning to same living situation after discharge?: Patient requesting long term referral Currently receiving community mental health services: No (Did not follow up with [facility] at last discharge If no, would patient like referral for services when discharged?: Yes (What county?) [facility]) Does patient have financial barriers related to discharge medications?: No  Summary/Recommendations:   Patient is a 49 year old African American unemployed male admitted with a diagnosis of Major Depressive Disorder Recurrent Severe, Cocaine Use Disorder Severe Dependence and Alcohol Use Disorder Moderate.  Patient reports he is currently homeless and experiencing suicidal ideation. Patient requesting long term substance abuse treatment. Patient will benefit from crisis stabilization, medication evaluation, group therapy and psycho education in addition to case management for discharge planning. Patient is smoker yet refused [place] Quit[facility] referral. Suicide Prevention pamphlet provided to patient.  Discharge Process and Patient Expectations information sheet signed by patient, witnessed by writer and inserted in patient's shadow chart.

## 2014-09-10 NOTE — H&P (Signed)
Psychiatric Admission Assessment Adult  Patient Identification: Samuel Atkinson MRN:  [id number] Date of Evaluation:  09/10/2014 Chief Complaint:  MDD,REC SEV COCAINE USE DISORDER ETOH USE DISORDER Principal Diagnosis: <principal problem not specified> Diagnosis:   Patient Active Problem List   Diagnosis Date Noted  . Major depressive disorder [F32.2] 09/10/2014  . Alcohol abuse [F10.10] 09/09/2014  . Substance induced mood disorder [F19.94] 09/09/2014  . Cocaine use disorder, severe, dependence [F14.20] 09/07/2014  . Suicidal ideation [R45.851]   . Seizures [R56.9] 02/09/2013  . Cerebral AVM [Q28.2] 02/09/2013   History of Present Illness:: 49 Y/O male who was discharged from our unit on February the 4 th. He came back as states " I lost everything," my marriage, my job... States that his wife put him out. " I am in the streets I am homeless." I just drink, use cocaine ( crack.) cant tell how much he drinks. States he drinks as much as he can get. Admits to suicidal ideas with plans to cut himself The initial assessment at the ED is as follows: [name] is an 49 y.o. male presenting to [place] reporting suicidal ideations. Pt stated "I don't want to live anymore". "I had a razor earlier because drugs didn't do the trick; I gotta think of something else". Pt reported that he has attempted suicide in the past and was recently discharged from [facility]. Pt stated "I need long term treatment". Pt is currently not receiving any mental health treatment at this time. When pt was asked about stressors pt stated "I don't have no job or wife; I don't have nothing". "What's the point, nobody cares". Pt did not respond to the depression questionnaire; however he shared that his sleep and appetite has decreased. Pt denies HI and AVH at this time. Pt denied having access to weapons or firearms and stated "they took my razor". Pt reported that he uses crack/cocaine and drinks alcohol daily. Pt did not report any  physical, sexual or emotional abuse at this time.  Pt is drowsy and oriented to person, place and situation. Pt is calm and cooperative at this time. Pt maintained poor eye contact throughout this assessment. Pt mood is depressed and sad; affect congruent with mood. Pt speech is soft. Thought process is logical and coherent. Inpatient treatment is recommended.   Elements:  Location:  major depression wiht alcohol cocaine abuse. Quality:  things getting worst wants to kill himseld. Severity:  severe. Timing:  every day. Duration:  pretty much since he left Feb 4. Context:  major depression wiht alcohol cocaine dependence wiht SI and plan after he lost his job and his wife kicked him out. Associated Signs/Symptoms: Depression Symptoms:  depressed mood, anhedonia, hypersomnia, fatigue, difficulty concentrating, hopelessness, suicidal thoughts with specific plan, loss of energy/fatigue, disturbed sleep, weight loss, (Hypo) Manic Symptoms:  Irritable Mood, Labiality of Mood, Anxiety Symptoms:  denies Psychotic Symptoms:  denies PTSD Symptoms: Negative Total Time spent with patient: 45 minutes  Past Medical History:  Past Medical History  Diagnosis Date  . Hypertension   . Seizure disorder   . Hx of substance abuse   . AVM (arteriovenous malformation)   . Seizures     Past Surgical History  Procedure Laterality Date  . Avm embolization  2013   Family History:  Family History  Problem Relation Age of Onset  . Hypertension Father   . Coronary artery disease Father   . Alcohol abuse Father   . Diabetes Mother   . Hypertension Mother   .  Coronary artery disease Brother   . Healthy Brother   . Coronary artery disease Brother   . Drug abuse Brother   . Drug abuse Brother    Social History:  History  Alcohol Use  . 3.6 - 7.2 oz/week  . 6-12 Cans of beer per week    Comment: Unsure of amount / pt. states he drank $150 worth ETOH in 2 days     History  Drug Use  .  Yes    Comment: Unsure of amount /states $700 worth in 2 days    History   Social History  . Marital Status: Single    Spouse Name: [name]  . Number of Children: 3  . Years of Education: GED    Occupational History  . Unemployed    Social History Main Topics  . Smoking status: Current Every Day Smoker -- 0.50 packs/day    Types: Cigarettes  . Smokeless tobacco: Never Used  . Alcohol Use: 3.6 - 7.2 oz/week    6-12 Cans of beer per week     Comment: Unsure of amount / pt. states he drank $150 worth ETOH in 2 days  . Drug Use: Yes     Comment: Unsure of amount /states $700 worth in 2 days  . Sexual Activity: Yes    Birth Control/ Protection: None   Other Topics Concern  . None   Social History Narrative   Patient lives at home with [name]). Patient works full time time.   Education 12 th grade.   Caffeine two cups of coffee daily. Three mountain dews daily.   Right handed.   Additional Social History:     Right now homeless, wife threw him out, has 3 children they are grown in [place], was working in [employer] for few months HS diploma                      Musculoskeletal: Strength & Muscle Tone: within normal limits Gait & Station: normal Patient leans: N/A  Psychiatric Specialty Exam: Physical Exam  Review of Systems  Constitutional: Positive for weight loss and malaise/fatigue.  HENT: Negative.   Eyes: Negative.   Respiratory:       Pack a day  Cardiovascular: Negative.   Gastrointestinal: Negative.   Genitourinary: Negative.   Musculoskeletal: Negative.   Skin: Negative.   Neurological: Positive for dizziness and weakness.  Endo/Heme/Allergies: Negative.   Psychiatric/Behavioral: Positive for depression, suicidal ideas and substance abuse. The patient is nervous/anxious.     Blood pressure 108/64, pulse 96, temperature 98 F (36.7 C), temperature source Oral, resp. rate 16, height $RemoveBe'5\' 11"'vHrfOFKIc$  (1.803 m), weight 73.936 kg (163 lb), SpO2 99  %.Body mass index is 22.74 kg/(m^2).  General Appearance: Disheveled  Eye Contact::  Minimal  Speech:  Clear and Coherent and not spontaneous, reserved guarded  Volume:  fluctuates  Mood:  Angry, Depressed, Dysphoric and Irritable  Affect:  Restricted and angry  Thought Process:  Coherent and Goal Directed  Orientation:  Full (Time, Place, and Person)  Thought Content:  events symptoms worries concers  Suicidal Thoughts:  Yes.  with intent/plan  Homicidal Thoughts:  No  Memory:  Immediate;   Fair Recent;   Fair Remote;   Fair  Judgement:  Fair  Insight:  Present and Shallow  Psychomotor Activity:  Restlessness and tense  Concentration:  Fair  Recall:  [place]  Language: Fair  Akathisia:  No  Handed:  Right  AIMS (  if indicated):     Assets:  Desire for Improvement  ADL's:  Intact  Cognition: WNL  Sleep:  Number of Hours: 3.75   Risk to Self: Is patient at risk for suicide?: No Risk to Others:   Prior Inpatient Therapy: [facility] Prior Outpatient Therapy: He has not followed up outpatient  Alcohol Screening: 1. How often do you have a drink containing alcohol?: 4 or more times a week 2. How many drinks containing alcohol do you have on a typical day when you are drinking?: 5 or 6 3. How often do you have six or more drinks on one occasion?: Weekly Preliminary Score: 5 4. How often during the last year have you found that you were not able to stop drinking once you had started?: Never 5. How often during the last year have you failed to do what was normally expected from you becasue of drinking?: Never 6. How often during the last year have you needed a first drink in the morning to get yourself going after a heavy drinking session?: Weekly 7. How often during the last year have you had a feeling of guilt of remorse after drinking?: Never 8. How often during the last year have you been unable to remember what happened the night before because you had been  drinking?: Never 9. Have you or someone else been injured as a result of your drinking?: No 10. Has a relative or friend or a doctor or another health worker been concerned about your drinking or suggested you cut down?: No Alcohol Use Disorder Identification Test Final Score (AUDIT): 12 Brief Intervention: Patient declined brief intervention  Allergies:   Allergies  Allergen Reactions  . Latex Rash   Lab Results:  Results for orders placed or performed during the hospital encounter of 09/09/14 (from the past 48 hour(s))  CBC with Differential     Status: Abnormal   Collection Time: 09/09/14  2:23 AM  Result Value Ref Range   WBC 9.3 4.0 - 10.5 K/uL   RBC 4.75 4.22 - 5.81 MIL/uL   Hemoglobin 15.8 13.0 - 17.0 g/dL   HCT 44.3 39.0 - 52.0 %   MCV 93.3 78.0 - 100.0 fL   MCH 33.3 26.0 - 34.0 pg   MCHC 35.7 30.0 - 36.0 g/dL   RDW 13.0 11.5 - 15.5 %   Platelets 218 150 - 400 K/uL   Neutrophils Relative % 67 43 - 77 %   Neutro Abs 6.1 1.7 - 7.7 K/uL   Lymphocytes Relative 22 12 - 46 %   Lymphs Abs 2.1 0.7 - 4.0 K/uL   Monocytes Relative 11 3 - 12 %   Monocytes Absolute 1.1 (H) 0.1 - 1.0 K/uL   Eosinophils Relative 0 0 - 5 %   Eosinophils Absolute 0.0 0.0 - 0.7 K/uL   Basophils Relative 0 0 - 1 %   Basophils Absolute 0.0 0.0 - 0.1 K/uL  Comprehensive metabolic panel     Status: Abnormal   Collection Time: 09/09/14  2:23 AM  Result Value Ref Range   Sodium 139 135 - 145 mmol/L   Potassium 4.2 3.5 - 5.1 mmol/L   Chloride 107 96 - 112 mmol/L   CO2 22 19 - 32 mmol/L   Glucose, Bld 67 (L) 70 - 99 mg/dL   BUN 19 6 - 23 mg/dL   Creatinine, Ser 1.24 0.50 - 1.35 mg/dL   Calcium 8.8 8.4 - 10.5 mg/dL   Total Protein 7.2 6.0 - 8.3 g/dL  Albumin 4.3 3.5 - 5.2 g/dL   AST 62 (H) 0 - 37 U/L   ALT 32 0 - 53 U/L   Alkaline Phosphatase 68 39 - 117 U/L   Total Bilirubin 0.8 0.3 - 1.2 mg/dL   GFR calc non Af Amer 67 (L) >90 mL/min   GFR calc Af Amer 77 (L) >90 mL/min    Comment: (NOTE) The  eGFR has been calculated using the CKD EPI equation. This calculation has not been validated in all clinical situations. eGFR's persistently <90 mL/min signify possible Chronic Kidney Disease.    Anion gap 10 5 - 15  Ethanol     Status: Abnormal   Collection Time: 09/09/14  2:24 AM  Result Value Ref Range   Alcohol, Ethyl (B) 53 (H) 0 - 9 mg/dL    Comment:        LOWEST DETECTABLE LIMIT FOR SERUM ALCOHOL IS 11 mg/dL FOR MEDICAL PURPOSES ONLY   Acetaminophen level     Status: Abnormal   Collection Time: 09/09/14  2:24 AM  Result Value Ref Range   Acetaminophen (Tylenol), Serum <10.0 (L) 10 - 30 ug/mL    Comment:        THERAPEUTIC CONCENTRATIONS VARY SIGNIFICANTLY. A RANGE OF 10-30 ug/mL MAY BE AN EFFECTIVE CONCENTRATION FOR MANY PATIENTS. HOWEVER, SOME ARE BEST TREATED AT CONCENTRATIONS OUTSIDE THIS RANGE. ACETAMINOPHEN CONCENTRATIONS >150 ug/mL AT 4 HOURS AFTER INGESTION AND >50 ug/mL AT 12 HOURS AFTER INGESTION ARE OFTEN ASSOCIATED WITH TOXIC REACTIONS.   Salicylate level     Status: None   Collection Time: 09/09/14  2:24 AM  Result Value Ref Range   Salicylate Lvl <5.9 2.8 - 20.0 mg/dL  CBG monitoring, ED     Status: Abnormal   Collection Time: 09/09/14  2:26 AM  Result Value Ref Range   Glucose-Capillary 57 (L) 70 - 99 mg/dL  CBG monitoring, ED     Status: Abnormal   Collection Time: 09/09/14  3:19 AM  Result Value Ref Range   Glucose-Capillary 159 (H) 70 - 99 mg/dL  Urine rapid drug screen (hosp performed)     Status: Abnormal   Collection Time: 09/09/14  4:30 AM  Result Value Ref Range   Opiates NONE DETECTED NONE DETECTED   Cocaine POSITIVE (A) NONE DETECTED   Benzodiazepines NONE DETECTED NONE DETECTED   Amphetamines NONE DETECTED NONE DETECTED   Tetrahydrocannabinol NONE DETECTED NONE DETECTED   Barbiturates NONE DETECTED NONE DETECTED    Comment:        DRUG SCREEN FOR MEDICAL PURPOSES ONLY.  IF CONFIRMATION IS NEEDED FOR ANY PURPOSE, NOTIFY  LAB WITHIN 5 DAYS.        LOWEST DETECTABLE LIMITS FOR URINE DRUG SCREEN Drug Class       Cutoff (ng/mL) Amphetamine      1000 Barbiturate      200 Benzodiazepine   563 Tricyclics       875 Opiates          300 Cocaine          300 THC              50    Current Medications: Current Facility-Administered Medications  Medication Dose Route Frequency Provider Last Rate Last Dose  . acetaminophen (TYLENOL) tablet 650 mg  650 mg Oral Q6H PRN Niel Hummer, NP      . albuterol (PROVENTIL HFA;VENTOLIN HFA) 108 (90 BASE) MCG/ACT inhaler 1-2 puff  1-2 puff Inhalation Q6H PRN Niel Hummer,  NP      . alum & mag hydroxide-simeth (MAALOX/MYLANTA) 200-200-20 MG/5ML suspension 30 mL  30 mL Oral Q4H PRN Niel Hummer, NP      . amLODipine (NORVASC) tablet 10 mg  10 mg Oral Daily Niel Hummer, NP      . magnesium hydroxide (MILK OF MAGNESIA) suspension 30 mL  30 mL Oral Daily PRN Niel Hummer, NP      . multivitamin with minerals tablet 1 tablet  1 tablet Oral Daily Niel Hummer, NP      . tiotropium [facility]) inhalation capsule 18 mcg  18 mcg Inhalation Daily PRN Niel Hummer, NP      . traZODone (DESYREL) tablet 50 mg  50 mg Oral QHS PRN Niel Hummer, NP       PTA Medications: Prescriptions prior to admission  Medication Sig Dispense Refill Last Dose  . albuterol (PROVENTIL HFA;VENTOLIN HFA) 108 (90 BASE) MCG/ACT inhaler Inhale 1-2 puffs into the lungs every 6 (six) hours as needed for wheezing or shortness of breath.   unknown at unknown  . amLODipine (NORVASC) 10 MG tablet Take 1 tablet (10 mg total) by mouth daily. 30 tablet 0 09/08/2014 at Unknown time  . B Complex-Biotin-FA (SUPER B-50 COMPLEX) CAPS Take 1 capsule by mouth daily.   09/08/2014 at Unknown time  . Calcium-Magnesium (CALCIUM MAGNESIUM 750) 300-300 MG TABS Take 1 tablet by mouth daily.  0 09/08/2014 at Unknown time  . levETIRAcetam 750 MG TB24 Take 2 tablets (1,500 mg total) by mouth at bedtime. 60 tablet 0 09/08/2014 at  Unknown time  . mirtazapine (REMERON) 30 MG tablet Take 1 tablet (30 mg total) by mouth at bedtime. 30 tablet 0 09/08/2014 at Unknown time  . Multiple Vitamin (MULTIVITAMIN WITH MINERALS) TABS tablet Take 1 tablet by mouth daily. 30 tablet 0 09/08/2014 at Unknown time  . risperiDONE (RISPERDAL) 2 MG tablet Take 1 tablet (2 mg total) by mouth at bedtime. 30 tablet 0 09/08/2014 at Unknown time  . thiamine 100 MG tablet Take 1 tablet (100 mg total) by mouth daily. 30 tablet 0 09/08/2014 at Unknown time  . tiotropium (SPIRIVA) 18 MCG inhalation capsule Place 1 capsule (18 mcg total) into inhaler and inhale daily as needed (for shortness of breath). 30 capsule 12 09/08/2014 at Unknown time    Previous Psychotropic Medications: Yes   Substance Abuse History in the last 12 months:  Yes.      Consequences of Substance Abuse: Withdrawal Symptoms:   "just hot"  Results for orders placed or performed during the hospital encounter of 09/09/14 (from the past 72 hour(s))  CBC with Differential     Status: Abnormal   Collection Time: 09/09/14  2:23 AM  Result Value Ref Range   WBC 9.3 4.0 - 10.5 K/uL   RBC 4.75 4.22 - 5.81 MIL/uL   Hemoglobin 15.8 13.0 - 17.0 g/dL   HCT 44.3 39.0 - 52.0 %   MCV 93.3 78.0 - 100.0 fL   MCH 33.3 26.0 - 34.0 pg   MCHC 35.7 30.0 - 36.0 g/dL   RDW 13.0 11.5 - 15.5 %   Platelets 218 150 - 400 K/uL   Neutrophils Relative % 67 43 - 77 %   Neutro Abs 6.1 1.7 - 7.7 K/uL   Lymphocytes Relative 22 12 - 46 %   Lymphs Abs 2.1 0.7 - 4.0 K/uL   Monocytes Relative 11 3 - 12 %   Monocytes Absolute 1.1 (H) 0.1 -  1.0 K/uL   Eosinophils Relative 0 0 - 5 %   Eosinophils Absolute 0.0 0.0 - 0.7 K/uL   Basophils Relative 0 0 - 1 %   Basophils Absolute 0.0 0.0 - 0.1 K/uL  Comprehensive metabolic panel     Status: Abnormal   Collection Time: 09/09/14  2:23 AM  Result Value Ref Range   Sodium 139 135 - 145 mmol/L   Potassium 4.2 3.5 - 5.1 mmol/L   Chloride 107 96 - 112 mmol/L   CO2 22  19 - 32 mmol/L   Glucose, Bld 67 (L) 70 - 99 mg/dL   BUN 19 6 - 23 mg/dL   Creatinine, Ser 1.24 0.50 - 1.35 mg/dL   Calcium 8.8 8.4 - 10.5 mg/dL   Total Protein 7.2 6.0 - 8.3 g/dL   Albumin 4.3 3.5 - 5.2 g/dL   AST 62 (H) 0 - 37 U/L   ALT 32 0 - 53 U/L   Alkaline Phosphatase 68 39 - 117 U/L   Total Bilirubin 0.8 0.3 - 1.2 mg/dL   GFR calc non Af Amer 67 (L) >90 mL/min   GFR calc Af Amer 77 (L) >90 mL/min    Comment: (NOTE) The eGFR has been calculated using the CKD EPI equation. This calculation has not been validated in all clinical situations. eGFR's persistently <90 mL/min signify possible Chronic Kidney Disease.    Anion gap 10 5 - 15  Ethanol     Status: Abnormal   Collection Time: 09/09/14  2:24 AM  Result Value Ref Range   Alcohol, Ethyl (B) 53 (H) 0 - 9 mg/dL    Comment:        LOWEST DETECTABLE LIMIT FOR SERUM ALCOHOL IS 11 mg/dL FOR MEDICAL PURPOSES ONLY   Acetaminophen level     Status: Abnormal   Collection Time: 09/09/14  2:24 AM  Result Value Ref Range   Acetaminophen (Tylenol), Serum <10.0 (L) 10 - 30 ug/mL    Comment:        THERAPEUTIC CONCENTRATIONS VARY SIGNIFICANTLY. A RANGE OF 10-30 ug/mL MAY BE AN EFFECTIVE CONCENTRATION FOR MANY PATIENTS. HOWEVER, SOME ARE BEST TREATED AT CONCENTRATIONS OUTSIDE THIS RANGE. ACETAMINOPHEN CONCENTRATIONS >150 ug/mL AT 4 HOURS AFTER INGESTION AND >50 ug/mL AT 12 HOURS AFTER INGESTION ARE OFTEN ASSOCIATED WITH TOXIC REACTIONS.   Salicylate level     Status: None   Collection Time: 09/09/14  2:24 AM  Result Value Ref Range   Salicylate Lvl <4.1 2.8 - 20.0 mg/dL  CBG monitoring, ED     Status: Abnormal   Collection Time: 09/09/14  2:26 AM  Result Value Ref Range   Glucose-Capillary 57 (L) 70 - 99 mg/dL  CBG monitoring, ED     Status: Abnormal   Collection Time: 09/09/14  3:19 AM  Result Value Ref Range   Glucose-Capillary 159 (H) 70 - 99 mg/dL  Urine rapid drug screen (hosp performed)     Status: Abnormal    Collection Time: 09/09/14  4:30 AM  Result Value Ref Range   Opiates NONE DETECTED NONE DETECTED   Cocaine POSITIVE (A) NONE DETECTED   Benzodiazepines NONE DETECTED NONE DETECTED   Amphetamines NONE DETECTED NONE DETECTED   Tetrahydrocannabinol NONE DETECTED NONE DETECTED   Barbiturates NONE DETECTED NONE DETECTED    Comment:        DRUG SCREEN FOR MEDICAL PURPOSES ONLY.  IF CONFIRMATION IS NEEDED FOR ANY PURPOSE, NOTIFY LAB WITHIN 5 DAYS.        LOWEST DETECTABLE  LIMITS FOR URINE DRUG SCREEN Drug Class       Cutoff (ng/mL) Amphetamine      1000 Barbiturate      200 Benzodiazepine   932 Tricyclics       355 Opiates          300 Cocaine          300 THC              50     Observation Level/Precautions:  15 minute checks  Laboratory:  As per the ED  Psychotherapy:  Individual/group  Medications:  Ativan Detox  Consultations:    Discharge Concerns:    Estimated LOS: 3-5 days  Other:     Psychological Evaluations: No   Treatment Plan Summary: Daily contact with patient to assess and evaluate symptoms and progress in treatment and Medication management Alcohol dependence: Ativan Detox protocol Cocaine dependence: monitor mood instability coming from coming off the cocaine Major Depression; reassess and address need  for psychotropics.  CBT, mindfulness Work a relapse Scientist, research (medical) Decision Making:  Review of Psycho-Social Stressors (1), Review or order clinical lab tests (1), Review of Medication Regimen & Side Effects (2) and Review of New Medication or Change in Dosage (2)  I certify that inpatient services furnished can reasonably be expected to improve the patient's condition.   Mardee Clune A 3/13/201610:26 AM

## 2014-09-10 NOTE — Progress Notes (Signed)
D) Pt spent a good part of the day in his bed and did not attend any of the groups. Affect is angry and mood is irritable. Pt has gone down for meals. C/O a swollen right hand from where the IV was. Ice applied.  A) Pt provided with a 1:1. Given support and encouragement and allowed to rest due to getting in late last night. Reoriented to the unit. All his medications explained.  R) Pt denies SI and HI. Did not want to fill out his paper work and refused to do the inventory today.

## 2014-09-10 NOTE — [facility] Suicide Risk Assessment (Signed)
[facility] INPATIENT:  Family/Significant Other Suicide Prevention Education  Suicide Prevention Education:  Education Completed; Samuel Atkinson at [phone] has been identified by the patient as the family member/significant other with whom the patient will be residing, and identified as the person(s) who will aid the patient in the event of a mental health crisis (suicidal ideations/suicide attempt).  With written consent from the patient, the family member/significant other has been provided the following suicide prevention education, prior to the and/or following the discharge of the patient.  The suicide prevention education provided includes the following:  Suicide risk factors  Suicide prevention and interventions  National Suicide Hotline telephone number  [facility] assessment telephone number  [facility] Assistance 911  [facility] and/or [facility]idential [facility] telephone number  Request made of family/significant other to:  Remove weapons (e.g., guns, rifles, knives), all items previously/currently identified as safety concern.    Remove drugs/medications (over-the-counter, prescriptions, illicit drugs), all items previously/currently identified as a safety concern.  The family member/significant other verbalizes understanding of the suicide prevention education information provided.  The family member/significant other agrees to remove the items of safety concern listed above.  Samuel Atkinson, Samuel Atkinson 09/10/2014, 11:47 AM

## 2014-09-10 NOTE — Progress Notes (Signed)
Admission Note:  Patient is 49 years old male who presents voluntarily in no acute distress for the treatment of SI and Depression. Pt appears sleepy and tired though concentrated more on his meal than the admission process. Patient responds to YES or NO answers by nodding and wouldn't talk and at times closes his eyes while sewing on his sandwich and salad.  A:Skin was assessed, tattoo found on both arms ad right shoulder. No wound , rashes,or abnormalities seen. POC and unit policies explained and understanding verbalized. Consents obtained.  R: Pt had no additional questions or concerns.

## 2014-09-10 NOTE — [facility] Counselor (Signed)
Adult Comprehensive Assessment  Patient ID: Samuel Atkinson, male DOB: 08-12-1965, 49 y.o. MRN: [id number]  Information Source: Information source: Patient  Current Stressors:  Education/learning: NA Employment / Job issues: Unemployed Family Relationships: Strained; "they'd be better off without meEngineer, petroleum / Lack of resources (include bankruptcy): "No income" Housing: "Homeless"  Physical Health: NA Social Relationships: Isolating Substance abuse: recent relapse on alcohol and cocaine Bereavement: NA  Living/Environment/Situation:  Living Arrangements: Homeless Living conditions (as described by patient or guardian):  How long has patient lived in current situation?: 2 weeks or more What is atmosphere in current home: Dangerous  Family History:  Marital status: Married Number of Years Married: 2 What types of issues is patient dealing with in the relationship?: Pt unable to return home Additional relationship information: N/A Does patient have children?: Yes How many children?: 3 How is patient's relationship with their children?: adult children - reports being close to them but "they would be better off without me"  Childhood History:  By whom was/is the patient raised?: Both parents Additional childhood history information: Pt describes his childhood as "it sucked". Pt states that his father was a Haematologist and alcoholic. Upset parent divorced after 34 years of marriage.  Description of patient's relationship with caregiver when they were a child: Pt reports being close to his parents growing up for the most part.  Patient's description of current relationship with people who raised him/her: Pt reports strained relationship with family now due to his history of substance use Does patient have siblings?: Yes Number of Siblings: 8 Description of patient's current relationship with siblings: 6 deceased, one living twin brother he is very close to, 1 living  sister Did patient suffer any verbal/emotional/physical/sexual abuse as a child?: No Did patient suffer from severe childhood neglect?: No Has patient ever been sexually abused/assaulted/raped as an adolescent or adult?: No Was the patient ever a victim of a crime or a disaster?: No Witnessed domestic violence?: No Has patient been effected by domestic violence as an adult?: No  Education:  Highest grade of school patient has completed: 12 Currently a student?: No Name of school: N/A Learning disability?: No  Employment/Work Situation:  Employment situation: Unemployed Patient's job has been impacted by current illness: No What is the longest time patient has a held a job?: "years" 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 Patient vague as to amounts used yet reports he doesn't run out, does black out and once he starts no matter what time of day he continues using until he passes out If attempted suicide, did drugs/alcohol play a role in this?: No Alcohol/Substance Abuse Treatment Hx: Past Tx, Inpatient, Attends AA/NA If yes, describe treatment: [facility] - 2015 and [facility] 2016 Has alcohol/substance abuse ever caused legal problems?: No  Social Support System:  [employer] Support System: Good usually, just not right now with the wife Describe Community Support System: Pt reports his family is very supportive Type of faith/religion: Ephriam Knuckles How does patient's faith help to cope with current illness? Nothing is helping, I have no faith right now  Leisure/Recreation:  Leisure and Hobbies:  Nothing  Strengths/Needs:  What things does the patient do well?: Good worker In what areas does patient struggle / problems for patient: Depression, substance use, housing  Discharge Plan:  Does patient have access to transportation?: No Plan for no access to transportation at discharge: Bus voucher Will patient be returning to same living situation after discharge?: Patient requesting long term referral Currently receiving community mental health services: No (Did not follow up with [facility] at last discharge If no, would patient like referral for services when discharged?: Yes (What county?) [facility] Does patient have financial barriers related to discharge medications?: No  Summary/Recommendations:  Patient is a 49 year old African American unemployed male admitted with a diagnosis of Major Depressive Disorder Recurrent Severe, Cocaine Use Disorder Severe Dependence and Alcohol Use Disorder Moderate.  Patient reports he is currently homeless and experiencing suicidal ideation. Patient requesting long term substance abuse treatment. Patient will benefit from crisis stabilization, medication evaluation, group therapy and psycho education in addition to case management for discharge planning. Patient is smoker yet refused [facility] referral. Suicide Prevention pamphlet provided to patient. Discharge Process and Patient Expectations information sheet signed by patient, witnessed by writer and inserted in patient's shadow chart.  Carney BernCatherine C Harrill, LCSW  09/10/2014  11:32 AM

## 2014-09-10 NOTE — Progress Notes (Signed)
Patient did not attend the evening speaker AA meeting. Pt was notified that group was beginning but remained in his room.    

## 2014-09-11 ENCOUNTER — Other Ambulatory Visit (HOSPITAL_COMMUNITY): Payer: PRIVATE HEALTH INSURANCE

## 2014-09-11 ENCOUNTER — Encounter (HOSPITAL_COMMUNITY): Payer: Self-pay | Admitting: Emergency Medicine

## 2014-09-11 MED ORDER — ADULT MULTIVITAMIN W/MINERALS CH: 1.0000 | ORAL_TABLET | Freq: Every day | ORAL | Status: DC

## 2014-09-11 MED ORDER — CALCIUM-MAGNESIUM 300-300 MG PO TABS: 1.0000 | ORAL_TABLET | Freq: Every day | ORAL | Status: DC

## 2014-09-11 MED ORDER — AMLODIPINE BESYLATE 10 MG PO TABS: 10.0000 mg | ORAL_TABLET | Freq: Every day | ORAL | Status: DC

## 2014-09-11 MED ORDER — TRAZODONE HCL 50 MG PO TABS: 50.0000 mg | ORAL_TABLET | Freq: Every evening | ORAL | Status: DC | PRN

## 2014-09-11 MED ORDER — IBUPROFEN 800 MG PO TABS
800.0000 mg | ORAL_TABLET | Freq: Once | ORAL | Status: AC
  Administered 2014-09-11: 800 mg via ORAL
  Filled 2014-09-11: qty 1

## 2014-09-11 MED ORDER — IBUPROFEN 400 MG PO TABS: 400.0000 mg | ORAL_TABLET | Freq: Four times a day (QID) | ORAL | Status: DC | PRN

## 2014-09-11 MED ORDER — SUPER B-50 COMPLEX PO CAPS: 1.0000 | ORAL_CAPSULE | Freq: Every day | ORAL | Status: DC

## 2014-09-11 MED ORDER — THIAMINE HCL 100 MG PO TABS: 100.0000 mg | ORAL_TABLET | Freq: Every day | ORAL | Status: DC

## 2014-09-11 NOTE — Progress Notes (Signed)
D:  Patient's self inventory sheet, patient slept good last night, sleep medication was helpful.  Good appetite, low energy level, poor concentration.  Rated depression, hopeless and anxiety #8.  Denied withdrawals.  Denied SI.  Denied pain.  Goal is to work on discharge plans.  A:  Medications administered per MD orders.  Emotional support and encouragement given patient. R:  Denied SI and HI, contracts for safety.  Safety maintained with 15 minute  Checks.

## 2014-09-11 NOTE — Progress Notes (Signed)
D: Patient presents with blunt affect and elective mutism. Answers question with question when he feels like talking. Patient did not      respond when asked about pain, SI, AH/VH.  A:  Accepted his scheduled medications as ordered. No behavioral issues noted. Every 15 minutes check maintained. Will continue        to monitor patient for stability. R: Patient not responding to some questions on purpose.

## 2014-09-11 NOTE — Tx Team (Signed)
Interdisciplinary Treatment Plan Update (Adult)   Date: 09/11/2014   Time Reviewed: 9:30AM Progress in Treatment:  Attending groups: No  Participating in groups: No  Taking medication as prescribed: Yes  Tolerating medication: Yes  Family/Significant othe contact made: SPE completed with pt's wife. CSW working with pt's wife regarding placement at long term tx facility.  Patient understands diagnosis: Yes, AEB seeking treatment for MDD, SI, ETOH abuse/cocaine abuse).  Discussing patient identified problems/goals with staff: Yes  Medical problems stabilized or resolved: Yes  Denies suicidal/homicidal ideation: Yes during self report.  Patient has not harmed self or Others: Yes  New problem(s) identified:  Discharge Plan or Barriers: Pt to transfer directly to [facility] (likely on Tues 3/15). He has phone [facility] 1:00PM [facility] Maisie Fushomas will then arrange flight for tomorrow pending MD approval. CSW assessing.  Additional comments: Pt is 49 year old male presenting to [facility] reporting suicidal ideations. Pt stated "I don't want to live anymore". "I had a razor earlier because drugs didn't do the trick; I gotta think of something else". Pt reported that he has attempted suicide in the past and [facility]. Pt stated "I need long term treatment". Pt is currently not receiving any mental health treatment at this time. When pt was asked about stressors pt stated "I don't have no job or wife; I don't have nothing". "What's the point, nobody cares". Pt did not respond to the depression questionnaire; however he shared that his sleep and appetite has decreased. Pt denies HI and AVH at this time. Pt denied having access to weapons or firearms and stated "they took my razor". Pt reported that he uses crack/cocaine and drinks alcohol daily. Pt did not report any physical, sexual or emotional abuse at this time. Samuel Atkinson was discharged from our unit on February  the 4 th. He came back as states " I lost everything," my marriage, my job... States that his wife put him out. " I am in the streets I am homeless." I just drink, use cocaine ( crack.) cant tell how much he drinks. States he drinks as much as he can get. SI upon admission; currently pt denies SI/HI/AVH but continues to isolate in room and is not attending group.  Reason for Continuation of Hospitalization: Ativan taper-withdrawals Medication management Depression/agitation/mood instability  Estimated length of stay: 1-2 days-pt to be transferred directly [facility] [place] [place]  For review of initial/current patient goals, please see plan of care.  Attendees:  Patient:    Family:    Physician: Samuel LyonsIrving Lugo MD 09/11/2014   Nursing: Clovis PuBrittany T RN; [place] Bay[place]everly AQ  09/11/2014   Clinical Social Worker Thimothy Barretta Smart, LCSWA  09/11/2014   Other: Marene LenzQuylle LCSW; Gennaro AfricaKristin D. LCSWA; Anne LCSW 09/11/2014   Other: Darden DatesJennifer C. Nurse CM 09/11/2014   Other: Liliane Badeolora Sutton, Community Care Coordinator  09/11/2014   Other:    Scribe for Treatment Team:  Herbert SetaHeather Smart LCSWA 09/11/2014 9:30AM

## 2014-09-11 NOTE — ED Provider Notes (Signed)
CSN: [id number]     Arrival date & time 09/11/14  1333 History  This chart was scribed for non-physician practitioner, Joycie PeekBenjamin Angus Amini, PA-C working with Gilda Creasehristopher J Pollina, MD by Luisa DagoPriscilla Tutu, Medical Scribe. This patient was seen in room [facility] and the patient's care was started at 2:15 PM.     Chief Complaint  Patient presents with  . hand swollen    The history is provided by the patient and medical records. No language interpreter was used.   HPI Comments: Samuel Atkinson is a 49 y.o. male who presents to the Emergency Department complaining of right hand swelling from an IV that he first noticed this AM. Pt states that he is starting to experience some pain to the affected hand which he rates it as a "5/10". He has not tried anything to improve his symptoms. No other aggravating or modifying factors. Pt denies fever, neck pain, sore throat, visual disturbance, CP, cough, SOB, abdominal pain, nausea, emesis, diarrhea, urinary symptoms, back pain, HA, weakness, numbness and rash as associated symptoms.     Past Medical History  Diagnosis Date  . Hypertension   . Seizure disorder   . Hx of substance abuse   . AVM (arteriovenous malformation)   . Seizures    Past Surgical History  Procedure Laterality Date  . Avm embolization  2013   Family History  Problem Relation Age of Onset  . Hypertension Father   . Coronary artery disease Father   . Alcohol abuse Father   . Diabetes Mother   . Hypertension Mother   . Coronary artery disease Brother   . Healthy Brother   . Coronary artery disease Brother   . Drug abuse Brother   . Drug abuse Brother    History  Substance Use Topics  . Smoking status: Current Every Day Smoker -- 0.50 packs/day    Types: Cigarettes  . Smokeless tobacco: Never Used  . Alcohol Use: 3.6 - 7.2 oz/week    6-12 Cans of beer per week     Comment: Unsure of amount / pt. states he drank $150 worth ETOH in 2 days    Review of Systems   Constitutional: Negative for fever and chills.  Respiratory: Negative for shortness of breath and wheezing.   Musculoskeletal: Positive for joint swelling and arthralgias.  Skin: Negative for rash.  Neurological: Negative for numbness.  All other systems reviewed and are negative.     Allergies  Latex  Home Medications   Prior to Admission medications   Medication Sig Start Date End Date Taking? Authorizing Provider  albuterol (PROVENTIL HFA;VENTOLIN HFA) 108 (90 BASE) MCG/ACT inhaler Inhale 1-2 puffs into the lungs every 6 (six) hours as needed for wheezing or shortness of breath. 08/03/14   Adonis BrookSheila Agustin, NP  amLODipine (NORVASC) 10 MG tablet Take 1 tablet (10 mg total) by mouth daily. 09/11/14   Thermon LeylandLaura A Davis, NP  B Complex-Biotin-FA (SUPER B-50 COMPLEX) CAPS Take 1 capsule by mouth daily. 09/11/14   Thermon LeylandLaura A Davis, NP  Calcium-Magnesium (CALCIUM MAGNESIUM 750) 300-300 MG TABS Take 1 tablet by mouth daily. 09/11/14   Thermon LeylandLaura A Davis, NP  Multiple Vitamin (MULTIVITAMIN WITH MINERALS) TABS tablet Take 1 tablet by mouth daily. 09/11/14   Thermon LeylandLaura A Davis, NP  thiamine 100 MG tablet Take 1 tablet (100 mg total) by mouth daily. 09/11/14   Thermon LeylandLaura A Davis, NP  tiotropium (SPIRIVA) 18 MCG inhalation capsule Place 1 capsule (18 mcg total) into inhaler and inhale daily as  needed (for shortness of breath). 08/03/14   Adonis Brook, NP  traZODone (DESYREL) 50 MG tablet Take 1 tablet (50 mg total) by mouth at bedtime as needed for sleep. 09/11/14   Thermon Leyland, NP   BP 121/63 mmHg  Pulse 92  Temp(Src) 98.2 F (36.8 C) (Oral)  Resp 18  Ht  (1.803 m)  Wt 163 lb (73.936 kg)  BMI 22.74 kg/m2  SpO2 100%  Physical Exam  Constitutional: He is oriented to person, place, and time. He appears well-developed and well-nourished. No distress.  HENT:  Head: Normocephalic and atraumatic.  Eyes: Conjunctivae and EOM are normal.  Neck: Neck supple.  Cardiovascular: Normal rate.   Pulmonary/Chest: Effort  normal. No respiratory distress.  Musculoskeletal: Normal range of motion.  Neurological: He is alert and oriented to person, place, and time.  Skin: Skin is warm and dry.  Very mild swelling around dorsum of right hand. No evidence of infection, hemarthrosis. Maintains full active range of motion of wrist and all digits.  Psychiatric: He has a normal mood and affect. His behavior is normal.  Nursing note and vitals reviewed.   ED Course  Procedures (including critical care time)  DIAGNOSTIC STUDIES: Oxygen Saturation is 100% on RA, normal by my interpretation.    COORDINATION OF CARE: 2:18 PM- Pt advised of plan for treatment and pt agrees.   MDM  Vitals stable - WNL -afebrile Pt resting comfortably in ED. DDX--no evidence of hemarthrosis, septic joint. Benign neuro exam and patient maintains full active range of motion of all digits and wrist. Mildly diffuse inflammation to dorsum of hand. Will treat conservatively with NSAIDs.  I discussed all relevant lab findings and imaging results with pt and they verbalized understanding. Discussed f/u with PCP within 48 hrs and return precautions, pt very amenable to plan.  Final diagnoses:  Pain of right hand    I personally performed the services described in this documentation, which was scribed in my presence. The recorded information has been reviewed and is accurate.    Joycie Peek, PA-C 09/12/14 2108  Gilda Crease, MD 09/13/14 [phone]

## 2014-09-11 NOTE — ED Notes (Signed)
Per pt, states he was here and had IV in right hand and now it is swollen

## 2014-09-11 NOTE — Plan of Care (Signed)
Problem: Consults Goal: Depression Patient Education See Patient Education Module for education specifics.  Outcome: Completed/Met Date Met:  09/11/14 Nurse discussed depression with patient.     

## 2014-09-11 NOTE — Progress Notes (Addendum)
Pt is pleasant this am. He is c/o that his right arm is not any better and painful. Arm is warm and swollen and very tight. Pt does have range of motion but painful. Positive radial and brachial pulse and good capillary refill.  Pt continues to elevate his arm and apply warm compresses w/o relief. Np and MD to be made aware.Pt stated," the ambulance drivers gave me fluid in that arm and medicine for vomitting."Pt does appear blunted . He did go back to his bed and put the covers over his head. Pt does contract for safety and denies SI and HI.He is cooperative and at times isolates in his room. 1:15pm-Pt was taken across the street with a tech. [facility] transported the pt for evaulation of his right arm. Pt does appear brighter and has been playing cards in the dayroom with the other pts.

## 2014-09-11 NOTE — Clinical Social Work Note (Signed)
CSW spoke with pt's wife and Rudi RummageGreg Thomas from [facility] [phone]). They are [facility] and will arrange plane transport to their facility. CSW assessing with MD. Pt has not been up this morning for groups. Pt must complete phone screening at 1:00PM with Tammy SoursGreg as well.  The Sherwin-WilliamsHeather Smart, LCSWA 09/11/2014 10:39 AM

## 2014-09-11 NOTE — Discharge Instructions (Signed)
You were evaluated in the ED for your right hand discomfort. There does not appear to be an emergent cause for your symptoms at this time. He will be treated with anti-inflammatories to help with the discomfort. Return to ED for new or worsening symptoms. Please follow-up with primary care for further evaluation and management of your symptoms.

## 2014-09-11 NOTE — [facility] Group Notes (Signed)
[facility] LCSW Group Therapy  09/11/2014 3:23 PM  Type of Therapy:  Group Therapy  Participation Level:  Did Not Attend-[facility] during group time. Excused from group.   Summary of Progress/Problems: Today's Topic: Overcoming Obstacles. Pt identified obstacles faced currently and processed barriers involved in overcoming these obstacles. Pt identified steps necessary for overcoming these obstacles and explored motivation (internal and external) for facing these difficulties head on. Pt further identified one area of concern in their lives and chose a skill of focus pulled from their "toolbox."   Smart, [place]ather LCSWA 09/11/2014, 3:23 PM

## 2014-09-11 NOTE — Progress Notes (Signed)
[facility]  09/11/2014 4:44 PM Samuel Atkinson  MRN:  [id number] Subjective:  Samuel Atkinson is going trough the detox. He was [place] a residential treatment [place] of [place]. His wife has made the arrangements. He is willing to go. He still not feeling too well. Concerned about the swelling of the arm where he had his IV connected. Principal Problem: Alcohol dependence Diagnosis:   Patient Active Problem List   Diagnosis Date Noted  . Major depressive disorder [F32.2] 09/10/2014  . Cocaine abuse [F14.10] 09/10/2014  . Alcohol dependence [F10.20] 09/10/2014  . Alcohol abuse [F10.10] 09/09/2014  . Substance induced mood disorder [F19.94] 09/09/2014  . Cocaine use disorder, severe, dependence [F14.20] 09/07/2014  . Suicidal ideation [R45.851]   . Seizures [R56.9] 02/09/2013  . Cerebral AVM [Q28.2] 02/09/2013   Total Time spent with patient: 30 minutes   Past Medical History:  Past Medical History  Diagnosis Date  . Hypertension   . Seizure disorder   . Hx of substance abuse   . AVM (arteriovenous malformation)   . Seizures     Past Surgical History  Procedure Laterality Date  . Avm embolization  2013   Family History:  Family History  Problem Relation Age of Onset  . Hypertension Father   . Coronary artery disease Father   . Alcohol abuse Father   . Diabetes Mother   . Hypertension Mother   . Coronary artery disease Brother   . Healthy Brother   . Coronary artery disease Brother   . Drug abuse Brother   . Drug abuse Brother    Social History:  History  Alcohol Use  . 3.6 - 7.2 oz/week  . 6-12 Cans of beer per week    Comment: Unsure of amount / pt. states he drank $150 worth ETOH in 2 days     History  Drug Use  . Yes    Comment: Unsure of amount /states $700 worth in 2 days    History   Social History  . Marital Status: Single    Spouse Name: Vikki PortsValerie  . Number of Children: 3  . Years of Education: GED    Occupational History  . Unemployed     Social History Main Topics  . Smoking status: Current Every Day Smoker -- 0.50 packs/day    Types: Cigarettes  . Smokeless tobacco: Never Used  . Alcohol Use: 3.6 - 7.2 oz/week    6-12 Cans of beer per week     Comment: Unsure of amount / pt. states he drank $150 worth ETOH in 2 days  . Drug Use: Yes     Comment: Unsure of amount /states $700 worth in 2 days  . Sexual Activity: Yes    Birth Control/ Protection: None   Other Topics Concern  . None   Social History Narrative   Patient lives at home with [name](Valerie). Patient works full time time.   Education 12 th grade.   Caffeine two cups of coffee daily. Three mountain dews daily.   Right handed.   Additional History:    Sleep: Fair  Appetite:  Fair   Assessment:   Musculoskeletal: Strength & Muscle Tone: within normal limits Gait & Station: normal Patient leans: N/A   Psychiatric Specialty Exam: Physical Exam  Review of Systems  Constitutional: Negative.   HENT: Negative.   Eyes: Negative.   Respiratory: Negative.   Cardiovascular: Negative.   Gastrointestinal: Negative.   Genitourinary: Negative.   Musculoskeletal:  Pain and swelling in his right arm at the site of an IV   Skin: Negative.   Neurological: Negative.   Endo/Heme/Allergies: Negative.   Psychiatric/Behavioral: Positive for depression and substance abuse. The patient is nervous/anxious.     Blood pressure 121/52, pulse 89, temperature 98.2 F (36.8 C), temperature source Oral, resp. rate 18, height  (1.803 m), weight 73.936 kg (163 lb), SpO2 100 %.Body mass index is 22.74 kg/(m^2).  General Appearance: Fairly Groomed  Patent attorney::  Fair  Speech:  Clear and Coherent  Volume:  Normal  Mood:  Anxious and worried, irritable  Affect:  worried irritated  Thought Process:  Coherent and Goal Directed  Orientation:  Full (Time, Place, and Person)  Thought Content:  symptoms events worries concerns  Suicidal Thoughts:  No  Homicidal  Thoughts:  No  Memory:  Immediate;   Fair Recent;   Fair Remote;   Fair  Judgement:  Fair  Insight:  Present  Psychomotor Activity:  Restlessness  Concentration:  Fair  Recall:  [employer] of Knowledge:Fair  Language: Fair  Akathisia:  No  Handed:  Right  AIMS (if indicated):     Assets:  Desire for Improvement Social Support  ADL's:  Intact  Cognition: WNL  Sleep:  Number of Hours: 6.25     Current Medications: Current Facility-Administered Medications  Medication Dose Route Frequency Provider Last Rate Last Dose  . acetaminophen (TYLENOL) tablet 650 mg  650 mg Oral Q6H PRN Thermon Leyland, NP   650 mg at 09/10/14 2137  . albuterol (PROVENTIL HFA;VENTOLIN HFA) 108 (90 BASE) MCG/ACT inhaler 1-2 puff  1-2 puff Inhalation Q6H PRN Thermon Leyland, NP      . alum & mag hydroxide-simeth (MAALOX/MYLANTA) 200-200-20 MG/5ML suspension 30 mL  30 mL Oral Q4H PRN Thermon Leyland, NP      . amLODipine (NORVASC) tablet 10 mg  10 mg Oral Daily Thermon Leyland, NP   10 mg at 09/11/14 0803  . hydrOXYzine (ATARAX/VISTARIL) tablet 25 mg  25 mg Oral Q6H PRN Rachael Fee, MD      . loperamide (IMODIUM) capsule 2-4 mg  2-4 mg Oral PRN Rachael Fee, MD      . LORazepam (ATIVAN) tablet 1 mg  1 mg Oral Q6H PRN Rachael Fee, MD      . LORazepam (ATIVAN) tablet 1 mg  1 mg Oral TID Rachael Fee, MD   1 mg at 09/11/14 1635   Followed by  . [START ON 09/12/2014] LORazepam (ATIVAN) tablet 1 mg  1 mg Oral BID Rachael Fee, MD       Followed by  . [START ON 09/14/2014] LORazepam (ATIVAN) tablet 1 mg  1 mg Oral Daily Rachael Fee, MD      . magnesium hydroxide (MILK OF MAGNESIA) suspension 30 mL  30 mL Oral Daily PRN Thermon Leyland, NP      . multivitamin with minerals tablet 1 tablet  1 tablet Oral Daily Rachael Fee, MD   1 tablet at 09/11/14 [id number]  . ondansetron (ZOFRAN-ODT) disintegrating tablet 4 mg  4 mg Oral Q6H PRN Rachael Fee, MD      . thiamine (VITAMIN B-1) tablet 100 mg  100 mg Oral Daily Rachael Fee, MD   100 mg at 09/11/14 0802  . tiotropium (SPIRIVA) inhalation capsule 18 mcg  18 mcg Inhalation Daily PRN Thermon Leyland, NP      .  traZODone (DESYREL) tablet 50 mg  50 mg Oral QHS PRN Thermon Leyland, NP   50 mg at 09/10/14 2137    Lab Results: No results found for this or any previous visit (from the past 48 hour(s)).  Physical Findings: AIMS: Facial and Oral Movements Muscles of Facial Expression: None, normal Lips and Perioral Area: None, normal Jaw: None, normal Tongue: None, normal,Extremity Movements Upper (arms, wrists, hands, fingers): None, normal Lower (legs, knees, ankles, toes): None, normal, Trunk Movements Neck, shoulders, hips: None, normal, Overall Severity Severity of abnormal movements (highest score from questions above): None, normal Incapacitation due to abnormal movements: None, normal Patient's awareness of abnormal movements (rate only patient's report): No Awareness, Dental Status Current problems with teeth and/or dentures?: No Does patient usually wear dentures?: No  CIWA:  CIWA-Ar Total: 1 COWS:     Treatment Plan Summary: Daily contact with patient to assess and evaluate symptoms and progress in treatment and Medication management Supportive approach/coping skills/relapse prevention Alcohol Dependence: continue the Ativan detox protocol Work a relapse prevention plan Swelling in arm: NP to address it Facilitate getting him to the residential treatment program out of states  Medical Decision Making:  Review of Psycho-Social Stressors (1), Review or order clinical lab tests (1), New Problem, with no additional work-up planned (3), Review of Medication Regimen & Side Effects (2) and Review of New Medication or Change in Dosage (2)     Ia Leeb A 09/11/2014, 4:44 PM

## 2014-09-11 NOTE — Clinical Social Work Note (Signed)
Pt completed phone screening and has been accepted to [facility] for admission tomorrow. Pt has flight at 5:30PM and is requesting d/c as early as 11:00AM in order to pack and get to airport in time.   The Sherwin-WilliamsHeather Smart, LCSWA 09/11/2014 3:43 PM

## 2014-09-11 NOTE — B[facility] Group Notes (Signed)
[facility]   09/11/2014 11:25 AM  Participation Quality:  Invited-DID NOT ATTEND. Pt chose to remain in bed.   Smart, American FinancialHeather LCSWA

## 2014-09-12 DIAGNOSIS — F1420 Cocaine dependence, uncomplicated: Secondary | ICD-10-CM

## 2014-09-12 DIAGNOSIS — F1994 Other psychoactive substance use, unspecified with psychoactive substance-induced mood disorder: Secondary | ICD-10-CM

## 2014-09-12 DIAGNOSIS — F10230 Alcohol dependence with withdrawal, uncomplicated: Secondary | ICD-10-CM

## 2014-09-12 NOTE — BHH Group Notes (Signed)
The focus of this group is to educate the patient on the purpose and policies of crisis stabilization and provide a format to answer questions about their admission.  The group details unit policies and expectations of patients while admitted.  Patient did not attend 0900 nurse education orientation group this morning.  Patient was in his room.   

## 2014-09-12 NOTE — Clinical Social Work Note (Signed)
CSW spoke with pt's wife this morning. She will pick up pt at 11:00AM. MD notified. Pt has 5:30PM [facility] [place].   The Sherwin-WilliamsHeather Smart, LCSWA  09/12/2014 9:10 AM

## 2014-09-12 NOTE — Discharge Summary (Signed)
Physician Discharge Summary Note  Patient:  Samuel Atkinson is an 49 y.o., male MRN:  [id number] DOB:  [birth year] Patient phone:  [phone] (home)  Patient address:   [address] [place] [address],  Total Time spent with patient: 30 minutes  Date of Admission:  09/10/2014 Date of Discharge: 09/12/2014  Reason for Admission:  Alcohol use disorder  Principal Problem: Alcohol dependence Discharge Diagnoses: Patient Active Problem List   Diagnosis Date Noted  . Major depressive disorder [F32.2] 09/10/2014  . Cocaine abuse [F14.10] 09/10/2014  . Alcohol dependence [F10.20] 09/10/2014  . Alcohol abuse [F10.10] 09/09/2014  . Substance induced mood disorder [F19.94] 09/09/2014  . Cocaine use disorder, severe, dependence [F14.20] 09/07/2014  . Suicidal ideation [R45.851]   . Seizures [R56.9] 02/09/2013  . Cerebral AVM [Q28.2] 02/09/2013    Musculoskeletal: Strength & Muscle Tone: within normal limits Gait & Station: normal Patient leans: N/A  Psychiatric Specialty Exam: Physical Exam  Psychiatric: His mood appears not anxious. His affect is not angry, not blunt, not labile and not inappropriate. His speech is not delayed, not tangential and not slurred. He is not agitated, not aggressive, not hyperactive, not slowed, not withdrawn, not actively hallucinating and not combative. Thought content is not paranoid and not delusional. Cognition and memory are not impaired. He does not express impulsivity or inappropriate judgment. He does not exhibit a depressed mood. He expresses no homicidal and no suicidal ideation. He expresses no suicidal plans and no homicidal plans. He is communicative. He exhibits normal recent memory and normal remote memory. He is attentive.    Review of Systems  Constitutional: Negative.   HENT: Negative.   Eyes: Negative.   Respiratory: Negative.   Cardiovascular: Negative.   Gastrointestinal: Negative.   Genitourinary: Negative.   Musculoskeletal:  Negative.   Skin: Negative.   Neurological: Negative.   Endo/Heme/Allergies: Negative.   Psychiatric/Behavioral: Positive for substance abuse (hx of , detoxed safely). Negative for depression, suicidal ideas, hallucinations and memory loss. The patient has insomnia (hx of , stabilized on medications). The patient is not nervous/anxious.     Blood pressure 102/73, pulse 92, temperature 98.7 F (37.1 C), temperature source Oral, resp. rate 19, height  (1.803 m), weight 73.936 kg (163 lb), SpO2 100 %.Body mass index is 22.74 kg/(m^2).  General Appearance:  See MD'S Suicide assessment   Past Medical History:  Past Medical History  Diagnosis Date  . Hypertension   . Seizure disorder   . Hx of substance abuse   . AVM (arteriovenous malformation)   . Seizures     Past Surgical History  Procedure Laterality Date  . Avm embolization  2013   Family History:  Family History  Problem Relation Age of Onset  . Hypertension Father   . Coronary artery disease Father   . Alcohol abuse Father   . Diabetes Mother   . Hypertension Mother   . Coronary artery disease Brother   . Healthy Brother   . Coronary artery disease Brother   . Drug abuse Brother   . Drug abuse Brother    Social History:  History  Alcohol Use  . 3.6 - 7.2 oz/week  . 6-12 Cans of beer per week    Comment: Unsure of amount / pt. states he drank $150 worth ETOH in 2 days     History  Drug Use  . Yes    Comment: Unsure of amount /states $700 worth in 2 days    History   Social  History  . Marital Status: Single    Spouse Name: Vikki PortsValerie  . Number of Children: 3  . Years of Education: GED    Occupational History  . Unemployed    Social History Main Topics  . Smoking status: Current Every Day Smoker -- 0.50 packs/day    Types: Cigarettes  . Smokeless tobacco: Never Used  . Alcohol Use: 3.6 - 7.2 oz/week    6-12 Cans of beer per week     Comment: Unsure of amount / pt. states he drank $150 worth ETOH in  2 days  . Drug Use: Yes     Comment: Unsure of amount /states $700 worth in 2 days  . Sexual Activity: Yes    Birth Control/ Protection: None   Other Topics Concern  . None   Social History Narrative   Patient lives at home with [name](Valerie). Patient works full time time.   Education 12 th grade.   Caffeine two cups of coffee daily. Three mountain dews daily.   Right handed.      Risk to Self: Is patient at risk for suicide?: No Risk to Others:   Prior Inpatient Therapy:   Prior Outpatient Therapy:    [facility]  Hospital Course:  Admitted for Alcohol and Cocaine dependence.  Well known to the service.  He had his detox treatment  with Ativan and was  discharged home on his way to [place] to continue treatment at a residential Rehabilitation center.  He denied SI/HI/AVH.  Consults:  None  Significant Diagnostic Studies:  labs: Alcohol level, UDS, Chemistry and CBC  Discharge Vitals:   Blood pressure 102/73, pulse 92, temperature 98.7 F (37.1 C), temperature source Oral, resp. rate 19, height 5\' 11"  (1.803 m), weight 73.936 kg (163 lb), SpO2 100 %. Body mass index is 22.74 kg/(m^2). Lab Results:   No results found for this or any previous visit (from the past 72 hour(s)).  Physical Findings: AIMS: Facial and Oral Movements Muscles of Facial Expression: None, normal Lips and Perioral Area: None, normal Jaw: None, normal Tongue: None, normal,Extremity Movements Upper (arms, wrists, hands, fingers): None, normal Lower (legs, knees, ankles, toes): None, normal, Trunk Movements Neck, shoulders, hips: None, normal, Overall Severity Severity of abnormal movements (highest score from questions above): None, normal Incapacitation due to abnormal movements: None, normal Patient's awareness of abnormal movements (rate only patient's report): No Awareness, Dental Status Current problems with teeth and/or dentures?: No Does patient usually wear dentures?: No  CIWA:   CIWA-Ar Total: 1 COWS:  COWS Total Score: 2   See Psychiatric Specialty Exam and Suicide Risk Assessment completed by Attending Physician prior to discharge.  Discharge destination:  Other:  to [place] for inpatient substance abuse rehabilitation facility  Is patient on multiple antipsychotic therapies at discharge:  No   Has Patient had three or more failed trials of antipsychotic monotherapy by history:  No    Recommended Plan for Multiple Antipsychotic Therapies: NA     Medication List    STOP taking these medications        Levetiracetam 750 MG Tb24     mirtazapine 30 MG tablet  Commonly known as:  REMERON     risperiDONE 2 MG tablet  Commonly known as:  RISPERDAL      TAKE these medications      Indication   albuterol 108 (90 BASE) MCG/ACT inhaler  Commonly known as:  PROVENTIL HFA;VENTOLIN HFA  Inhale 1-2 puffs into the lungs every  6 (six) hours as needed for wheezing or shortness of breath.   Indication:  Chronic Obstructive Lung Disease     amLODipine 10 MG tablet  Commonly known as:  NORVASC  Take 1 tablet (10 mg total) by mouth daily.   Indication:  High Blood Pressure     Calcium-Magnesium 300-300 MG Tabs  Commonly known as:  CALCIUM MAGNESIUM 750  Take 1 tablet by mouth daily.   Indication:  deficiency     multivitamin with minerals Tabs tablet  Take 1 tablet by mouth daily.   Indication:  supplement     SUPER B-50 COMPLEX Caps  Take 1 capsule by mouth daily.   Indication:  deficiency     thiamine 100 MG tablet  Take 1 tablet (100 mg total) by mouth daily.   Indication:  Deficiency in Thiamine or Vitamin B1     tiotropium 18 MCG inhalation capsule  Commonly known as:  SPIRIVA  Place 1 capsule (18 mcg total) into inhaler and inhale daily as needed (for shortness of breath).   Indication:  Chronic Obstructive Lung Disease     traZODone 50 MG tablet  Commonly known as:  DESYREL  Take 1 tablet (50 mg total) by mouth at bedtime as needed for sleep.    Indication:  Trouble Sleeping           Follow-up Information    Follow up with [facility]. Go on 09/12/2014.   Why:  You have been accepted for admission to the facility on this date. Plane ticket ordered. Discharge by 11:00AM.    Contact information:   [address] [place], [place] [address] [phone] FAX: [phone]      Follow-up recommendations:  Activity:  as tolerated Diet:  Regular  Comments:  Travelled to [place] for inpatient substance abuse rehabilitation treatment.  See above  Total Discharge Time:  30 Minutes  Signed: Earney Navy  PMHNP-BC 09/12/2014, 5:25 PM  I personally assessed the patient and formulated the plan Madie Reno A. Dub Mikes, M.D.

## 2014-09-12 NOTE — Progress Notes (Signed)
Discharge Note:  Patient discharged with wife to go to airport.  Denied SI and HI.  Denied A/V hallucinations.  Suicide prevention information given and discussed with patient who stated he understood and had no questions.  Patient stated he received all his belongings, clothing, toiletries, misc items, prescriptions, medications, bags, matches, belt, sneakers, etc.  Patient stated he appreciated all assistance received from [facility].

## 2014-09-12 NOTE — Progress Notes (Signed)
At the beginning of the shift, MHT came to writer and said that pt wanted something for the hiccups.  Writer went to see pt and explained that there is not medicine for hiccups, but writer had pt drink a cup of water without stopping and it did help the hiccups to stop.  Pt said he had the hiccups most of the day, but had not said anything to anyone else.  Pt has remained in his room most of the evening.  He did come to the med window for night time meds.  He is supposed to leave tomorrow for a treatment program out of state.  His wife has made all of the arrangements, and she called early in the shift with concerns that he might be discharged too early in the day as his flight is not until 5:30pm, and she does not want him to have that idle time to change his mind before he leaves.  Pt's wife was encouraged to call the CSW first thing in the morning and discuss her concerns.  A note with her concerns was put in for the treatment team.  Pt denies SI/HI/AV at this time.  He denies any withdrawal symptoms at this time.  He is irritable but cooperative.  Pt makes his needs known to staff.  Support and encouragement offered.  Safety maintained with q15 minute checks.

## 2014-09-12 NOTE — Progress Notes (Signed)
  [facility] :  Will you be returning to the same living situation after discharge:  No. Pt accepted to tx facility in [place]. Direct transfer-pt has plane leaving at 5:30PM today.  At discharge, do you have transportation home?: Yes,  pt's wife coming at 11:00AM to transport pt home to pack and then to airport.  Do you have the ability to pay for your medications: Yes,  [employer].   Release of information consent forms completed and submitted to medical records by CSW.  Patient to Follow up at: Follow-up Information    Follow up with [facility]. Go on 09/12/2014.   Why:  You have been accepted for admission to the facility on this date. Plane ticket ordered. Discharge by 11:00AM.    Contact information:   [address] [place], [place] [id number] PH:([phone] FAX: [phone]      Patient denies SI/HI: Yes,  during self report.     Safety Planning and Suicide Prevention discussed: Yes,  SPE completed with pt's wife. SPI pamphlet provided to pt and he was encouraged to share information with support network, ask questions, and talk about any concerns relating to SPE.     Has patient been referred to the Quitline?: Patient refused referral  Smart, Lebron QuamHeather LCSWA 09/12/2014, 9:10 AM

## 2014-09-12 NOTE — Plan of Care (Signed)
Problem: Consults Goal: Substance Abuse Patient Education See Patient Education Module for education specifics.  Outcome: Completed/Met Date Met:  09/12/14 Nurse discussed substance abuse with patient.

## 2014-09-12 NOTE — Progress Notes (Signed)
D:  Patient's self inventory sheet, patient has fair sleep, sleep medication is helpful.  Fair appetite, normal energy level, poor concentration.  Rated depression 2, hopeless 1, no anxiety.  Denied withdrawals.  Denied SI.  Has hiccups.  No physical pain.  Goal is to be "moving on".  Plans to do "what I have to".  Does have discharge plans.  No problems anticipated after discharge. A:  Medications administered per MD orders.  Emotional support and encouragement given patient. R:  Denied SI and HI, contracts for safety.  Denied A/V hallucinations.  Safety maintained with 15 minute checks.

## 2014-09-13 ENCOUNTER — Other Ambulatory Visit (HOSPITAL_COMMUNITY): Payer: PRIVATE HEALTH INSURANCE

## 2014-09-14 NOTE — Progress Notes (Signed)
Patient Discharge Instructions:  After Visit Summary (AVS):   Faxed to:  09/14/14 Discharge Summary Note:   Faxed to:  09/14/14 Psychiatric Admission Assessment Note:   Faxed to:  09/14/14 Faxed/Sent to the Next Level Care provider:  09/14/14 [facility] @ [phone]  Jerelene ReddenSheena E [place], 09/14/2014, 4:15 PM

## 2014-09-15 ENCOUNTER — Other Ambulatory Visit (HOSPITAL_COMMUNITY): Payer: PRIVATE HEALTH INSURANCE

## 2014-09-18 ENCOUNTER — Other Ambulatory Visit (HOSPITAL_COMMUNITY): Payer: PRIVATE HEALTH INSURANCE

## 2014-09-20 ENCOUNTER — Other Ambulatory Visit (HOSPITAL_COMMUNITY): Payer: PRIVATE HEALTH INSURANCE

## 2014-09-22 ENCOUNTER — Other Ambulatory Visit (HOSPITAL_COMMUNITY): Payer: PRIVATE HEALTH INSURANCE

## 2014-09-25 ENCOUNTER — Other Ambulatory Visit (HOSPITAL_COMMUNITY): Payer: PRIVATE HEALTH INSURANCE

## 2014-09-27 ENCOUNTER — Other Ambulatory Visit (HOSPITAL_COMMUNITY): Payer: PRIVATE HEALTH INSURANCE

## 2014-09-29 ENCOUNTER — Other Ambulatory Visit (HOSPITAL_COMMUNITY): Payer: PRIVATE HEALTH INSURANCE

## 2014-10-02 ENCOUNTER — Other Ambulatory Visit (HOSPITAL_COMMUNITY): Payer: PRIVATE HEALTH INSURANCE

## 2014-10-04 ENCOUNTER — Other Ambulatory Visit (HOSPITAL_COMMUNITY): Payer: PRIVATE HEALTH INSURANCE

## 2014-10-06 ENCOUNTER — Other Ambulatory Visit (HOSPITAL_COMMUNITY): Payer: PRIVATE HEALTH INSURANCE

## 2014-10-09 ENCOUNTER — Other Ambulatory Visit (HOSPITAL_COMMUNITY): Payer: PRIVATE HEALTH INSURANCE

## 2014-10-10 ENCOUNTER — Encounter: Payer: Self-pay | Admitting: Neurology

## 2014-10-10 ENCOUNTER — Ambulatory Visit (INDEPENDENT_AMBULATORY_CARE_PROVIDER_SITE_OTHER): Payer: 59 | Admitting: Neurology

## 2014-10-10 VITALS — BP 112/68 | HR 77 | Ht 73.0 in | Wt 189.0 lb

## 2014-10-10 DIAGNOSIS — R569 Unspecified convulsions: Secondary | ICD-10-CM

## 2014-10-10 DIAGNOSIS — Q282 Arteriovenous malformation of cerebral vessels: Secondary | ICD-10-CM | POA: Diagnosis not present

## 2014-10-10 MED ORDER — LAMOTRIGINE 25 MG PO TABS: ORAL_TABLET | ORAL | Status: DC

## 2014-10-10 NOTE — Progress Notes (Signed)
Reason for visit: Seizures  Samuel Atkinson is an 49 y.o. male  History of present illness:  Samuel Atkinson is a 49 year old right-handed black male with a history of alcohol and cocaine abuse and a history of seizures. The patient has a cerebral AVM, status post embolization therapy. The patient was just recently in the hospital in March 2016 after a relapse with his drug and alcohol abuse problem. The patient is now off the drugs. He remains on Keppra taking 500 mg twice daily, but he never went up on the medication to a 1500 mg nightly dose of Keppra XR. The patient has not had any recurrent seizures, the last seizure was in September 2015. The patient does operate a motor vehicle on occasion. He returns to this office for an evaluation. Indicates that the Keppra is causing too many side effects of drowsiness, he is not tolerating medication well.   Past Medical History  Diagnosis Date  . Hypertension   . Seizure disorder   . Hx of substance abuse   . AVM (arteriovenous malformation)   . Seizures     Past Surgical History  Procedure Laterality Date  . Avm embolization  2013    Family History  Problem Relation Age of Onset  . Hypertension Father   . Coronary artery disease Father   . Alcohol abuse Father   . Diabetes Mother   . Hypertension Mother   . Coronary artery disease Brother   . Healthy Brother   . Coronary artery disease Brother   . Drug abuse Brother   . Drug abuse Brother     Social history:  reports that he has been smoking Cigarettes.  He has been smoking about 0.50 packs per day. He has never used smokeless tobacco. He reports that he does not drink alcohol or use illicit drugs.    Allergies  Allergen Reactions  . Latex Rash    Medications:  Prior to Admission medications   Medication Sig Start Date End Date Taking? Authorizing Provider  albuterol (PROVENTIL HFA;VENTOLIN HFA) 108 (90 BASE) MCG/ACT inhaler Inhale 1-2 puffs into the lungs every 6 (six)  hours as needed for wheezing or shortness of breath. 08/03/14  Yes Adonis BrookSheila Agustin, NP  amLODipine (NORVASC) 10 MG tablet Take 1 tablet (10 mg total) by mouth daily. 09/11/14  Yes Thermon LeylandLaura A Davis, NP  B Complex-Biotin-FA (SUPER B-50 COMPLEX) CAPS Take 1 capsule by mouth daily. 09/11/14  Yes Thermon LeylandLaura A Davis, NP  Calcium-Magnesium (CALCIUM MAGNESIUM 750) 300-300 MG TABS Take 1 tablet by mouth daily. 09/11/14  Yes Thermon LeylandLaura A Davis, NP  Multiple Vitamin (MULTIVITAMIN WITH MINERALS) TABS tablet Take 1 tablet by mouth daily. 09/11/14  Yes Thermon LeylandLaura A Davis, NP  thiamine 100 MG tablet Take 1 tablet (100 mg total) by mouth daily. 09/11/14  Yes Thermon LeylandLaura A Davis, NP  tiotropium (SPIRIVA) 18 MCG inhalation capsule Place 1 capsule (18 mcg total) into inhaler and inhale daily as needed (for shortness of breath). 08/03/14  Yes Adonis BrookSheila Agustin, NP    ROS:  Out of a complete 14 system review of symptoms, the patient complains only of the following symptoms, and all other reviewed systems are negative.  Runny nose Snoring Environmental allergies Dizziness  Blood pressure 112/68, pulse 77, height 6\' 1"  (1.854 m), weight 189 lb (85.73 kg).  Physical Exam  General: The patient is alert and cooperative at the time of the examination.  Skin: No significant peripheral edema is noted.   Neurologic Exam  Mental  status: The patient is alert and oriented x 3 at the time of the examination. The patient has apparent normal recent and remote memory, with an apparently normal attention span and concentration ability.   Cranial nerves: Facial symmetry is present. Speech is normal, no aphasia or dysarthria is noted. Extraocular movements are full. Visual fields are full.  Motor: The patient has good strength in all 4 extremities.  Sensory examination: Soft touch sensation is symmetric on the face, arms, and legs.  Coordination: The patient has good finger-nose-finger and heel-to-shin bilaterally.  Gait and station: The patient has a  normal gait. Tandem gait is normal. Romberg is negative. No drift is seen.  Reflexes: Deep tendon reflexes are symmetric.   Assessment/Plan:  1. History of seizures  2. Cerebral AVM, status post embolization  3. Cocaine and alcohol abuse  The patient is not tolerating the Keppra well. We will switch him to Lamictal. The patient will work up to a 75 mg twice daily dose, and he will call our office, and we will call in the 100 mg Lamictal tablets for him. At that point, we will initiate a slow taper from the Keppra. The patient otherwise will follow-up in 6 months. He will contact our office if he is not doing well.  Marlan Palau MD 10/10/2014 7:34 PM  [facility] [address] [place], [place] [address]  Phone [phone] Fax [phone]

## 2014-10-10 NOTE — Patient Instructions (Addendum)
With the Lamictal, increase the dose as prescribed. When you get to 3 tablets twice daily for 2 weeks, call our office, and we will call in the 100 mg tablets of the Lamictal.  Epilepsy Epilepsy is a disorder in which a person has repeated seizures over time. A seizure is a release of abnormal electrical activity in the brain. Seizures can cause a change in attention, behavior, or the ability to remain awake and alert (altered mental status). Seizures often involve uncontrollable shaking (convulsions).  Most people with epilepsy lead normal lives. However, people with epilepsy are at an increased risk of falls, accidents, and injuries. Therefore, it is important to begin treatment right away. CAUSES  Epilepsy has many possible causes. Anything that disturbs the normal pattern of brain cell activity can lead to seizures. This may include:   Head injury.  Birth trauma.  High fever as a child.  Stroke.  Bleeding into or around the brain.  Certain drugs.  Prolonged low oxygen, such as what occurs after CPR efforts.  Abnormal brain development.  Certain illnesses, such as meningitis, encephalitis (brain infection), malaria, and other infections.  An imbalance of nerve signaling chemicals (neurotransmitters).  SIGNS AND SYMPTOMS  The symptoms of a seizure can vary greatly from one person to another. Right before a seizure, you may have a warning (aura) that a seizure is about to occur. An aura may include the following symptoms:  Fear or anxiety.  Nausea.  Feeling like the room is spinning (vertigo).  Vision changes, such as seeing flashing lights or spots. Common symptoms during a seizure include:  Abnormal sensations, such as an abnormal smell or a bitter taste in the mouth.   Sudden, general body stiffness.   Convulsions that involve rhythmic jerking of the face, arm, or leg on one or both sides.   Sudden change in consciousness.   Appearing to be awake but not  responding.   Appearing to be asleep but cannot be awakened.   Grimacing, chewing, lip smacking, drooling, tongue biting, or loss of bowel or bladder control. After a seizure, you may feel sleepy for a while. DIAGNOSIS  Your health care provider will ask about your symptoms and take a medical history. Descriptions from any witnesses to your seizures will be very helpful in the diagnosis. A physical exam, including a detailed neurological exam, is necessary. Various tests may be done, such as:   An electroencephalogram (EEG). This is a painless test of your brain waves. In this test, a diagram is created of your brain waves. These diagrams can be interpreted by a specialist.  An MRI of the brain.   A CT scan of the brain.   A spinal tap (lumbar puncture, LP).  Blood tests to check for signs of infection or abnormal blood chemistry. TREATMENT  There is no cure for epilepsy, but it is generally treatable. Once epilepsy is diagnosed, it is important to begin treatment as soon as possible. For most people with epilepsy, seizures can be controlled with medicines. The following may also be used:  A pacemaker for the brain (vagus nerve stimulator) can be used for people with seizures that are not well controlled by medicine.  Surgery on the brain. For some people, epilepsy eventually goes away. HOME CARE INSTRUCTIONS   Follow your health care provider's recommendations on driving and safety in normal activities.  Get enough rest. Lack of sleep can cause seizures.  Only take over-the-counter or prescription medicines as directed by your health  care provider. Take any prescribed medicine exactly as directed.  Avoid any known triggers of your seizures.  Keep a seizure diary. Record what you recall about any seizure, especially any possible trigger.   Make sure the people you live and work with know that you are prone to seizures. They should receive instructions on how to help you. In  general, a witness to a seizure should:   Cushion your head and body.   Turn you on your side.   Avoid unnecessarily restraining you.   Not place anything inside your mouth.   Call for emergency medical help if there is any question about what has occurred.   Follow up with your health care provider as directed. You may need regular blood tests to monitor the levels of your medicine.  SEEK MEDICAL CARE IF:   You develop signs of infection or other illness. This might increase the risk of a seizure.   You seem to be having more frequent seizures.   Your seizure pattern is changing.  SEEK IMMEDIATE MEDICAL CARE IF:   You have a seizure that does not stop after a few moments.   You have a seizure that causes any difficulty in breathing.   You have a seizure that results in a very severe headache.   You have a seizure that leaves you with the inability to speak or use a part of your body.  Document Released: 06/16/2005 Document Revised: 04/06/2013 Document Reviewed: 01/26/2013 [facility] Patient Information 2015 [employer] [place]. This information is not intended to replace advice given to you by your health care provider. Make sure you discuss any questions you have with your health care provider.

## 2014-10-11 ENCOUNTER — Other Ambulatory Visit (HOSPITAL_COMMUNITY): Payer: PRIVATE HEALTH INSURANCE | Attending: Psychiatry | Admitting: Licensed Clinical Social Worker

## 2014-10-11 ENCOUNTER — Other Ambulatory Visit (HOSPITAL_COMMUNITY): Payer: PRIVATE HEALTH INSURANCE

## 2014-10-11 DIAGNOSIS — F1420 Cocaine dependence, uncomplicated: Secondary | ICD-10-CM | POA: Diagnosis present

## 2014-10-11 DIAGNOSIS — I10 Essential (primary) hypertension: Secondary | ICD-10-CM | POA: Diagnosis not present

## 2014-10-11 DIAGNOSIS — F332 Major depressive disorder, recurrent severe without psychotic features: Secondary | ICD-10-CM | POA: Insufficient documentation

## 2014-10-11 DIAGNOSIS — F4310 Post-traumatic stress disorder, unspecified: Secondary | ICD-10-CM | POA: Insufficient documentation

## 2014-10-11 DIAGNOSIS — F1020 Alcohol dependence, uncomplicated: Secondary | ICD-10-CM | POA: Insufficient documentation

## 2014-10-11 DIAGNOSIS — R45851 Suicidal ideations: Secondary | ICD-10-CM | POA: Diagnosis not present

## 2014-10-12 ENCOUNTER — Encounter: Payer: Self-pay | Admitting: Psychiatry

## 2014-10-13 ENCOUNTER — Encounter (HOSPITAL_COMMUNITY): Payer: Self-pay | Admitting: Licensed Clinical Social Worker

## 2014-10-13 ENCOUNTER — Other Ambulatory Visit (HOSPITAL_COMMUNITY): Payer: PRIVATE HEALTH INSURANCE

## 2014-10-13 NOTE — Progress Notes (Signed)
    Daily Group Progress Note  Program: CD-IOP   Group Time: 1-2:30  Participation Level: Active  Behavioral Response: Appropriate and Sharing  Type of Therapy: Process Group  Topic: After checking in, group members took turns sharing about recovery-related activities and challenges they faced since our last group meeting.   Group members were active during this discussion and many group members engaged in feedback to other group members. Drug test results were returned.     Group Time: 2:45-4  Participation Level: Active  Behavioral Response: Appropriate and Sharing  Type of Therapy: Psycho-education Group  Topic: The second part of group focused on ACOA and dysfunctional family traits. Each group member was given "10 Things the Adult Child of An Alcoholic Wants You To Know" worksheet. Most group members were able to identify their own personal family dysfunction, most having been raised amongst chaos. Some members shared being a child of a dysfunctional/alcoholic family was complicated and probably resulted in their personal addiction and stunted emotional growth.   Summary: :  This is the first day for the patient in group today. He came one time in March but relapsed and went to inpatient treatment in [place]. He completed the program on 4/12. The patient gave good feedback and suggestions to other group members. He reports he is serious about his recovery this time. The patient reports he has not been to any meetings since he came home. Other group members gave him a list of NA meetings in [place]. The patient reported his father was an alcoholic, Haematologistwomanizer and abuser. He did not have a good relationship with his father. He tries hard to be a better father to his children but feels he has made many mistakes as a father. He has resentment with his father and worked on his resentment while in inpatient treatment. The patient appeared open and honest on his first day in group.  His sobriety date is 3/15.   Family Program: Family present? No   Name of family member(s):   UDS collected: No Results: Will take test Fridaiy  AA/NA attended?: No None Sponsor?: No   MACKENZIE,LISBETH S, Licensed Cli

## 2014-10-16 ENCOUNTER — Other Ambulatory Visit (HOSPITAL_COMMUNITY): Payer: PRIVATE HEALTH INSURANCE

## 2014-10-16 ENCOUNTER — Other Ambulatory Visit (HOSPITAL_COMMUNITY): Payer: PRIVATE HEALTH INSURANCE | Admitting: Licensed Clinical Social Worker

## 2014-10-16 ENCOUNTER — Encounter (HOSPITAL_COMMUNITY): Payer: Self-pay

## 2014-10-16 ENCOUNTER — Encounter (HOSPITAL_COMMUNITY): Payer: Self-pay | Admitting: Licensed Clinical Social Worker

## 2014-10-16 DIAGNOSIS — F1420 Cocaine dependence, uncomplicated: Secondary | ICD-10-CM | POA: Diagnosis not present

## 2014-10-16 DIAGNOSIS — Q282 Arteriovenous malformation of cerebral vessels: Secondary | ICD-10-CM

## 2014-10-16 NOTE — Progress Notes (Signed)
    Daily Group Progress Note  Program: CD-IOP   Group Time: 1-2:30  Participation Level: Active  Behavioral Response: Appropriate and Sharing  Type of Therapy: Process Group  Topic: After checking in, group members took turns sharing about recovery-related activities and challenges they faced since our last group meeting.   Group members were active during the discussion and many group members engaged in giving feedback to other group members. Two drug tests were returned. Four group members saw PA Maryjean Mornharles Kober.      Group Time: 2:45-4  Participation Level: Active  Behavioral Response: Appropriate and Sharing  Type of Therapy: Psycho-education Group  Topic: The second half of group focused on medications. [facility]. The group was engaged, attentive and asked many questions. The group also reported on weekend recovery plans.    Summary: The patient reported he went to bible study at his church Wednesday night. He went to a new meeting on [address] Thursday night and really enjoyed it. The patient reported he does read a daily devotional every morning which helps him start his day. He discussed at length the relationship between him and his wife since his completion of inpatient treatment. He is using boundaries with her because she wants to attend AA meetings with him. It was suggested to him that his wife should go to [employer] meetings instead. He agreed with the suggestion and welcomed the feedback. His weekend plans include yard work, meetings, movies and church. The patient reported the information presented by the pharmacist was helpful. His sobriety date remains 3/15.   Family Program: Family present? No   Name of family member(s):   UDS collected: No Results: negative  AA/NA attended?: YesThursday  Sponsor?: No   [facility]

## 2014-10-18 ENCOUNTER — Other Ambulatory Visit (HOSPITAL_COMMUNITY): Payer: PRIVATE HEALTH INSURANCE

## 2014-10-18 ENCOUNTER — Other Ambulatory Visit (HOSPITAL_COMMUNITY): Payer: PRIVATE HEALTH INSURANCE | Admitting: Psychology

## 2014-10-18 DIAGNOSIS — F1420 Cocaine dependence, uncomplicated: Secondary | ICD-10-CM

## 2014-10-18 DIAGNOSIS — Q282 Arteriovenous malformation of cerebral vessels: Secondary | ICD-10-CM

## 2014-10-19 ENCOUNTER — Encounter (HOSPITAL_COMMUNITY): Payer: Self-pay | Admitting: Psychology

## 2014-10-19 NOTE — Progress Notes (Signed)
    Daily Group Progress Note  Program: CD-IOP   Group Time: 1-2:30 pm  Participation Level: Active  Behavioral Response: Sharing  Type of Therapy: Process Group  Topic: After checking in, group members took turns sharing about recovery-related activities and challenges they faced since our last group meeting on Friday.   Group members were active during the discussion and many group members engaged in giving feedback to other group members. Two drug tests were returned.   Group Time: 2:45- 4pm  Participation Level: Active  Behavioral Response: Appropriate  Type of Therapy: Psycho-education Group  Topic: The second part of group focused on family history of addiction/alcoholism. Patients were given a genogram template to fill out their family tree. The genogram included siblings, parents, aunts, uncles, grandparents, great-grandparents, great uncles and aunts. They were also asked to identify family members with mental illness and addiction/alcoholism. After the patients completed the genogram template they transferred the genogram to poster board. Each patient shared their genogram with the other group members, explaining their family traits, mental illness and addiction/alcoholism. All group members took this activity seriously and appeared open and honest. Group members reported that doing this activity gave more insight to other group members.  Summary: The patient reported he went to a meeting on Sunday and really enjoyed the meeting. On Saturday he spent the day with his family. On Sunday he went to church. The patient reported he reads his devotional daily. The patient reported most of his immediate family is addicts/alcoholics including his father and 5 brothers. The patient spent a lot of time completing his genogram and seemed genuinely interested in the activity. He made some good comments and responded well to this intervention. His sobriety date remains 3/15.    Family  Program: Family present? No   Name of family member(s):   UDS collected: No Results:  AA/NA attended?: YesSaturday  Sponsor?: No   Arthella Headings, LCAS

## 2014-10-20 ENCOUNTER — Encounter (HOSPITAL_COMMUNITY): Payer: Self-pay | Admitting: Medical

## 2014-10-20 ENCOUNTER — Other Ambulatory Visit (HOSPITAL_COMMUNITY): Payer: PRIVATE HEALTH INSURANCE | Admitting: Psychology

## 2014-10-20 ENCOUNTER — Other Ambulatory Visit (HOSPITAL_COMMUNITY): Payer: PRIVATE HEALTH INSURANCE

## 2014-10-20 VITALS — BP 117/73 | HR 72 | Ht 73.0 in | Wt 187.4 lb

## 2014-10-20 DIAGNOSIS — F1420 Cocaine dependence, uncomplicated: Secondary | ICD-10-CM | POA: Diagnosis not present
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
## 2014-10-21 ENCOUNTER — Encounter (HOSPITAL_COMMUNITY): Payer: Self-pay | Admitting: Psychology

## 2014-10-21 NOTE — Progress Notes (Signed)
    Daily Group Progress Note  Program: CD-IOP   Group Time: 1-2:30 pm  Participation Level: Active  Behavioral Response: Appropriate and Sharing  Type of Therapy: Psycho-education Group  Topic: To begin, group members took turns sharing about any recovery related activities they had engaged in since the last group meeting, as well as any challenges that they faced.  The chaplain visited group today, and focused on experiencing emotions in the present moment.  Group members took turns discussing what they were feeling and experiencing, as well as providing feedback and advice to each other  Group Time: 2:45- 4pm  Participation Level: Active  Behavioral Response: Sharing  Type of Therapy: Process Group  Topic: The second portion of group was focused on allowing remaining group members who had not yet had the chance to check in to do so.  There was one new group member today and she took the time to introduce herself and share about what has led her to treatment at this time.  Drug tests were collected today as well.  Summary: The patient stated that he spent some time with his sister on Tuesday, and that she is a big part of his support system.  He also spoke to a few people in recovery and watched TV with his wife, which is something that is different for them.  He discussed his hesitancy to choose a sponsor, due to a negative experience he had had in the past.  The patient stated that he is feeling "excited" and that "life is good".  He did not attend any meetings since the last group session.  He was active in group discussions and thinks he is doing well. The patient will have to increase his engagement in recovery-related activities or he will not be able to make the lifestyle changes required in order to remain drug-free going forward. He made some good comments and responded well to this intervention.  His sobriety date remains 3/15.   Family Program: Family present? No   Name of  family member(s):   UDS collected: Yes Results: pending AA/NA attended?: No  Sponsor?: No   Celvin Taney, LCAS

## 2014-10-23 ENCOUNTER — Other Ambulatory Visit (HOSPITAL_COMMUNITY): Payer: PRIVATE HEALTH INSURANCE | Admitting: Licensed Clinical Social Worker

## 2014-10-23 ENCOUNTER — Encounter (HOSPITAL_COMMUNITY): Payer: Self-pay | Admitting: Psychology

## 2014-10-23 ENCOUNTER — Other Ambulatory Visit (HOSPITAL_COMMUNITY): Payer: PRIVATE HEALTH INSURANCE

## 2014-10-23 DIAGNOSIS — F1420 Cocaine dependence, uncomplicated: Secondary | ICD-10-CM

## 2014-10-23 NOTE — Progress Notes (Signed)
    Daily Group Progress Note  Program: CD-IOP   Group Time: 1-2:30 pm  Participation Level: Active  Behavioral Response: Sharing  Type of Therapy: Process Group  Topic: Process: the first part of group was spent in process. Members shared about what they have done since the last session to strengthen their recovery. Any challenges or struggles they have experienced are also identified. A new member was present in group today and she introduced herself to her new group members. During group today, the program director, CK, met with 2 new group members.  Group Time: 2:45 4 pm  Participation Level: Active  Behavioral Response: Appropriate and Sharing  Type of Therapy: Psycho-education Group  Topic: Psycho-Ed: The Phases of Recovery. The second half of group was spent in a psycho-ed. Members were provided with a handout on the phases of recovery. Included was "A Recovery Checklist" and members identified what they had completed in the past or what they were now dealing with in each phase of recovery. There was a good discussion with members taking turns reading the check-list and sharing their answers from the checklist. The session ended with members disclosing their weekend plans.   Summary: The patient reported he is doing well. He had attended an [employer] meeting this morning at the [employer] and it was a good meeting. The topic had been about "anger and resentments" and he explained that having learned how dangerous resentments can be to his recovery, he has learned to let things go. He also noted he attended Bible Study on Wednesday evening. He admitted he is reluctant to secure a sponsor having had problems in the past. He was reminded, though, that the willingness to share one's feelings with someone else and being open are very important in recovery. Without them, one remains distant and that lack of transparency is something only "Chuckie" benefits from. This would concur with his  identifying with many of the items listed under 'ambivalence'. This patient says all the right things, but he has had lots of treatment in the past and been incarcerated, so this is not surprising. What is confusing is why he isn't going to meetings every single day considering how destructive his addiction has proven to be in his life. We are scheduled to meet for individual session next week and we will address his depth and degree of commitment to change. During group today, the patient met for the first time with the medical director. The patient's sobriety date is 3/15.    Family Program: Family present? No   Name of family member(s):   UDS collected: No Results:   AA/NA attended?: YesFriday  Sponsor?: No   Zygmund Passero, LCAS

## 2014-10-24 ENCOUNTER — Encounter (HOSPITAL_COMMUNITY): Payer: Self-pay | Admitting: Licensed Clinical Social Worker

## 2014-10-24 ENCOUNTER — Encounter (HOSPITAL_COMMUNITY): Payer: Self-pay | Admitting: Psychology

## 2014-10-24 NOTE — Addendum Note (Signed)
Addended byLogan Bores: Madalyne Husk on: 10/24/2014 02:41 PM   Modules accepted: Medications

## 2014-10-24 NOTE — Progress Notes (Signed)
    Daily Group Progress Note  Program: CD-IOP   Group Time: 1-2:30  Participation Level: Active  Behavioral Response: Appropriate and Sharing  Type of Therapy: Process Group  Topic: After checking in, group members took turns sharing about recovery-related activities and challenges they faced since the last group on Friday.  Group members were active during the discussion and many group members engaged in giving feedback to other group members. Two new group members joined the group today and introduced themselves to the group. Drug tests were taken today.     Group Time: 2:45-4  Participation Level: Active  Behavioral Response: Appropriate and Sharing  Type of Therapy: Psycho-education Group  Topic: The second part of group focused on Part 2 Phases of Recovery. A continuation of Friday's group entailed each group member identifying issues in recovery they currently deal with or have in the past. Group members were engaged in discussion of the Ambivalence Phase of Recovery.     Summary: The patient reported he went to a meeting Saturday which focused on the 6th step. He also reported he called/texted other group members Saturday and Sunday. He went to church on Sunday and was able to talk with his 49 yo daughter who lives in [place]. The patient is not currently working and it was suggested that he should go to more meetings. The patient reports he doesn't have transportation. Other group members encouraged him to go and ask people for rides home. The patient gave feedback to other group members on the recovery checklist - ambivalence. The patient reports he has no ambivalence about using again. He was active in the discussion about the phases of recovery. His sobriety date remains3/15.   Family Program: Family present? No   Name of family member(s):   UDS collected: Yes Results: Pending  AA/NA attended?: YesSaturday  Sponsor?: No   MACKENZIE,LISBETH S, Licensed  Cli

## 2014-10-25 ENCOUNTER — Other Ambulatory Visit (HOSPITAL_COMMUNITY): Payer: PRIVATE HEALTH INSURANCE

## 2014-10-25 ENCOUNTER — Telehealth (HOSPITAL_COMMUNITY): Payer: Self-pay | Admitting: Psychology

## 2014-10-25 ENCOUNTER — Encounter (HOSPITAL_COMMUNITY): Payer: Self-pay | Admitting: Psychology

## 2014-10-25 NOTE — Progress Notes (Signed)
Samuel SingDavid Atkinson is a 49 y.o. male patient. CD-IOP: I phoned the patient's wife to confirm she and her husband are going out of town for a 3-day training for her work,. [place] and he is going with her. She confirmed that this was true and reminded me that with his AVM he shouldn't be alone for that period of time. There is such a high risk of seizure that she doesn't want him alone for days at a time. Samuel Atkinson also reported that he has been having headaches so they are really watching him. I noted that we had also talked about him getting his license and she promptly informed me that he cannot get a license because of his illness and the risk of seizure. I was perplexed because he had talked at length about this during our session yesterday. She explained his behavior as an issue of pride, but I regarded it more as just plain lying. I did not express this belief to her, but the patient and I will definitely discuss this when I see him again. Pride, ego and lying are all issues very prominent in active addiction. The patient will be excused for the next 2 group sessions, but he will not be allowed any more absences without a doctor's note and he will be drug tested when he returns.             Samuel Atkinson, LCAS

## 2014-10-25 NOTE — Progress Notes (Signed)
Samuel Atkinson is a 49 y.o. male patient. CD-IOP: Treatment Planning Session. I met with the patient this afternoon for his first individual session. He just returned last week from residential treatment in [place] and we had not been able to meet prior to today.  The patient reported he was doing okay. When I commented on the beautiful day, the patient complained that it was too hot for his liking. I thanked him for coming in this afternoon and explained the need to identify goals for treatment. He has had many treatment episodes in the past and was very familiar about the importance of goals. The patient agreed that staying clean is his primary goal. He agreed that he has to get support from other recovering addicts and appeared at peace knowing that he couldn't do this alone. I wondered why, based on his long history of addiction and legal problems stemming from his drug use, he doesn't attend more 12-step meetings? The patient reminded me that he doesn't have his driver's license and can't drive at this time. His wife takes him around and she also works so she isn't available all the time. I reminded this patient that if he can just get to some meetings he might be surprised at how many men will either pick him up to go to a meeting or take him home afterwards. He need not worry about transportation, but he has to get to some meetings, meet people, and let others know he doesn't currently drive. But he has to surrender and ask for help. It remains unclear whether this man's ego will allow that. The patient reported he owes about $3,000 for unpaid fees and tickets in his home state of [place]. At the same time, he pointed out that the last violation he had was around 1999. He wondered if there was a statute of limitations. He admitted he had decided that he will go to the local DMV and apply for his license and see what they do. The worst thing that can happen is they will tell me, "I can't have it". We agreed that  this was a good idea and he might just be surprised and find out that he is eligible for driving privileges here in [place]. The treatment plan was reviewed, signed and completed accordingly. As the patient prepared to leave, he informed me that he would not be in group tomorrow or Friday. "My wife has a work-related [place] and I am going with her". I wondered if this was absolutely necessary and he insisted that he was going to go. I informed him that he would be excused for these 2 days, but I reminded him that there would be no more absences if he is to be in our program. The group is not the same if a member is absent, plus they miss the material we cover on the days they are gone. The patient stated he understood. We will continue to follow closely in the days and weeks ahead. On Monday, when the patient returns, we would anticipate that he provides a UA and reports having attended at least 4 12-step meetings. His sobriety date is 3/15.        Deneka Greenwalt, LCAS

## 2014-10-27 ENCOUNTER — Other Ambulatory Visit (HOSPITAL_COMMUNITY): Payer: PRIVATE HEALTH INSURANCE

## 2014-10-30 ENCOUNTER — Other Ambulatory Visit (HOSPITAL_COMMUNITY): Payer: PRIVATE HEALTH INSURANCE | Attending: Psychiatry | Admitting: Licensed Clinical Social Worker

## 2014-10-30 ENCOUNTER — Other Ambulatory Visit (HOSPITAL_COMMUNITY): Payer: PRIVATE HEALTH INSURANCE

## 2014-10-30 DIAGNOSIS — F1020 Alcohol dependence, uncomplicated: Secondary | ICD-10-CM | POA: Insufficient documentation

## 2014-10-30 DIAGNOSIS — I10 Essential (primary) hypertension: Secondary | ICD-10-CM | POA: Insufficient documentation

## 2014-10-30 DIAGNOSIS — F1420 Cocaine dependence, uncomplicated: Secondary | ICD-10-CM | POA: Insufficient documentation

## 2014-11-01 ENCOUNTER — Other Ambulatory Visit (HOSPITAL_COMMUNITY): Payer: PRIVATE HEALTH INSURANCE | Admitting: Psychology

## 2014-11-01 ENCOUNTER — Other Ambulatory Visit (HOSPITAL_COMMUNITY): Payer: PRIVATE HEALTH INSURANCE

## 2014-11-01 ENCOUNTER — Encounter (HOSPITAL_COMMUNITY): Payer: Self-pay | Admitting: Licensed Clinical Social Worker

## 2014-11-01 DIAGNOSIS — F1420 Cocaine dependence, uncomplicated: Secondary | ICD-10-CM | POA: Diagnosis not present

## 2014-11-01 NOTE — Progress Notes (Signed)
    Daily Group Progress Note  Program: CD-IOP   Group Time: 1-2:30  Participation Level: Active  Behavioral Response: Appropriate and Sharing  Type of Therapy: Process Group  Topic:After checking in, group members took turns sharing about recovery-related activities and challenges they faced since the last group on Friday.  Group members were active during the discussion and many group members engaged in giving feedback to other group members. Drug tests were taken today.    Group Time: 2:45-4  Participation Level: Active  Behavioral Response: Appropriate and Sharing  Type of Therapy: Psycho-education Group  Topic: The second part of group focused on Family Dynamics of Addiction Part 1. Each group member was given a worksheet on the roles family members assume when there is addiction or dysfunction in the family. There was good discussion surrounding each of the family roles. Group members were able to identify what roles they assumed in their family of origin.   Summary: The patient returned from being absent from group 2 days last week. He went out of town with his wife who was on a business trip. Due to medical condition it is not wise for him to be alone. The patient reported he went to 2 meetings while out of town and went to another meeting on Sunday. He still has not found anyone that he wants to be his sponsor. The patient was an active participant in the discussion of family dynamics. He identified himself as all the roles during his childhood. He was raised in an alcoholic family and his 4 brothers were also addicts. The patient was raised upon massive dysfunction and took on all the roles to survive. He appeared honest in his description of his roles in his family of origin. His sobriety date remains 3/15.   Family Program: Family present? No   Name of family member(s):   UDS collected: Yes Results: Pending  AA/NA attended?: YesThursday and Sunday  Sponsor?:  No   Rukiya Hodgkins S, Licensed Cli

## 2014-11-02 ENCOUNTER — Encounter (HOSPITAL_COMMUNITY): Payer: Self-pay | Admitting: Psychology

## 2014-11-02 NOTE — Progress Notes (Signed)
    Daily Group Progress Note  Program: CD-IOP   Group Time: 1-2:30 pm  Participation Level: Active  Behavioral Response: Appropriate and Sharing  Type of Therapy: Process Group  Topic: Process: The first part of group was spent in process. Group members shared about what they have done since our last group session to support their recovery. They also identified any triggers or issues that may have contributed to cravings. There was good feedback and discussion among members. Drug test results collected on Monday were returned to those members.   Group Time: 2:45- 4pm  Participation Level: Active  Behavioral Response: Appropriate  Type of Therapy: Psycho-education Group  Topic: Psycho-Ed: Family Sculpture: the second half of group was spent in an exercise called "Family Sculpture". Group members were asked to 'sculpt' their families using their fellow group members to serve as their family members. They were asked to sculpt their families during some time when they were children. This proved to be a very emotional session and included disclosures about member's lives and childhoods that had not been revealed before. At the conclusion of this session, the facilitators checked in with group members to insure that everyone left feeling in control and stable.   Summary: The patient reported he had attended an 8 pm meeting called [facility] last night. It had been a good meeting and he intended to go to the same meeting tomorrow night. The patient explained that every morning he reads his devotional. It includes 3 different parts and it is biblically and recovery based. He will go to church this evening for Bible Study. In the second half of group, the patient was very engaged and served as a family member in other [employer]'s sculptures. He provided excellent feedback and displayed good insight into their roles and inner feelings. He was the last one to 'sculpt' his own family in group today and  he actually did 3 different sculptures to represent some events that were very powerful in his family life as well as his own. After the sculpture he shared about his father's death. He was able to make amends and be one of the family members present to care for his father as he was dying. The patient admitted this was not true with his mother. He had come to the dialysis facility and argued with her when she wouldn't give him any money. He was using crack and strung out at the time. She later had a medical emergency and stroke and he was never able to speak with her again after she had lost consciousness. The patient admitted he was 'sad' as he described this chain of events. But he also reported that in treatment a number of years ago, he wrote his mother a letter attempting to make things right. The writing wasn't particularly powerful, but reading it later was extremely emotional and he was able to make peace with himself. The patient was engaged and active throughout the group today and he responded well to this intervention. The drug test result returned for him was negative on all counts. His sobriety date remains 3/15.   Family Program: Family present? No   Name of family member(s):   UDS collected: No Results:   AA/NA attended?: [place]  Sponsor?: No   Leigh Kaeding, LCAS

## 2014-11-03 ENCOUNTER — Other Ambulatory Visit (HOSPITAL_COMMUNITY): Payer: PRIVATE HEALTH INSURANCE | Admitting: Licensed Clinical Social Worker

## 2014-11-03 ENCOUNTER — Other Ambulatory Visit (HOSPITAL_COMMUNITY): Payer: PRIVATE HEALTH INSURANCE

## 2014-11-03 DIAGNOSIS — F1420 Cocaine dependence, uncomplicated: Secondary | ICD-10-CM

## 2014-11-06 ENCOUNTER — Encounter (HOSPITAL_COMMUNITY): Payer: Self-pay | Admitting: Licensed Clinical Social Worker

## 2014-11-06 ENCOUNTER — Other Ambulatory Visit (HOSPITAL_COMMUNITY): Payer: PRIVATE HEALTH INSURANCE | Admitting: Licensed Clinical Social Worker

## 2014-11-06 ENCOUNTER — Other Ambulatory Visit (HOSPITAL_COMMUNITY): Payer: PRIVATE HEALTH INSURANCE

## 2014-11-06 ENCOUNTER — Ambulatory Visit: Payer: Managed Care, Other (non HMO) | Admitting: Neurology

## 2014-11-06 DIAGNOSIS — F1420 Cocaine dependence, uncomplicated: Secondary | ICD-10-CM | POA: Diagnosis not present

## 2014-11-06 NOTE — Progress Notes (Signed)
    Daily Group Progress Note  Program: CD-IOP  Group Time: 1-2:30  Participation Level: Active  Behavioral Response: Appropriate and Sharing  Type of Therapy:  Process Group  Summary of Progress: After checking in, group members took turns sharing about recovery-related activities and challenges they faced since the last group on Wednesday. One group member reported she relapsed. The relapse process was mapped out on the whiteboard. Group members were openly engaged in suggestions and feedback to the group member. Three group members saw PA Elo[place] today. One group member was absent today. The second part of group was a continuation of challenges they faced since the last group. Two group members also had "a burning desire" that they wanted to discuss. One group member reported she is sad, depressed, and angry. Another group member reported she is agitated, tired and in physical pain. She reported she wants to drink so she can sleep. Several group members gave suggestions and strategies for relapse and a discussion ensued on the effects of PAWS.  Group members showed support to both group members. All group members appeared open and honest in sharing feelings during the process.      Group Time: 2:45-4  Participation Level:  Active  Behavioral Response: Appropriate and Sharing  Type of Therapy: Process Group  Summary of Progress: The patient reported he went to Bible study Wednesday and a meeting Thursday night and saw 2 group members at the NA meeting. Patient's daughter is going to [employer] tonight and she lives with her mother in [place]. He is having feelings surrounding him not being able to see her at one of her milestones. The group showed support to the patient which he accepted well. The patient gave positive feedback to the 3 group members who were struggling with PAWS, relapse and sadness. He was appropriate in his feedback but challenged each patient to work through the issue and  ask for help - you can't do it alone. His sobriety date remains 3/15.  Lylianna Fraiser S, Licensed Cli

## 2014-11-07 ENCOUNTER — Encounter (HOSPITAL_COMMUNITY): Payer: Self-pay | Admitting: Licensed Clinical Social Worker

## 2014-11-07 NOTE — Progress Notes (Signed)
    Daily Group Progress Note  Program: CD-IOP   Group Time: 1-2:30  Participation Level: Active  Behavioral Response: Appropriate and Sharing  Type of Therapy: Process Group  Topic: After checking in, group members took turns sharing about recovery-related activities and challenges they faced since the last group on Friday. They also shared any urges or cravings they had over the weekend.  Group members were openly engaged in suggestions and feedback to the group member. One group member was absent. Drug tests were given out to each group member. One group member graduated today from the program. The patient graduating received accolades from her fellow group members. Her husband came for the graduation.    Group Time: 2:45-4  Participation Level: Active  Behavioral Response: Appropriate and Sharing  Type of Therapy: Psycho-education Group  Topic: The second part of group focused on cognitive distortions. A staff member from the [employer] began an 8 week program titled "Down the Rabbit Hole," focusing on cognitive distortions. Each week 2 cognitive distortions will be discussed along with coping tools to use to change thinking patterns. Group members were appropriate, attentive and appeared interested in the topic. Many group members were openly engaged in identifying rational thoughts that can be used to combat the distorted thought.      Summary: The patient reported on his daughter who went to the prom Friday night. He reported he had many feelings about not being there for a milestone event in her life. He did talk with her on the phone and saw many pictures of her dress. The patient went to a graduation at [employer] on Sunday with his family. He has not gone to any meetings since Thursday. The patient read from the worksheet on cognitive distortions and was an active participant on the topic. He was able to show an understanding of the topic by actively  identifying an example of cognitive distortions. The patient was encouraged to go to more meetings. His sobriety date remains 3/15.   Family Program: Family present? No   Name of family member(Atkinson):   UDS collected: Yes Results:   AA/NA attended?: [place]  Sponsor?: No   Samuel Atkinson,Samuel Atkinson, Licensed Cli

## 2014-11-08 ENCOUNTER — Other Ambulatory Visit (HOSPITAL_COMMUNITY): Payer: PRIVATE HEALTH INSURANCE

## 2014-11-08 ENCOUNTER — Other Ambulatory Visit (HOSPITAL_COMMUNITY): Payer: PRIVATE HEALTH INSURANCE | Admitting: Psychology

## 2014-11-08 DIAGNOSIS — F1420 Cocaine dependence, uncomplicated: Secondary | ICD-10-CM

## 2014-11-08 DIAGNOSIS — Q282 Arteriovenous malformation of cerebral vessels: Secondary | ICD-10-CM

## 2014-11-09 ENCOUNTER — Encounter (HOSPITAL_COMMUNITY): Payer: Self-pay | Admitting: Psychology

## 2014-11-09 NOTE — Progress Notes (Signed)
    Daily Group Progress Note  Program: CD-IOP   Group Time: 1-2:30 pm  Participation Level: Active  Behavioral Response: Appropriate and Sharing  Type of Therapy: Psycho-education Group  Topic: Psycho-Ed: the first part of group was spent in a psycho-ed with a visiting chaplain. He invited group members to share their own definitions of Spirituality. While some members easily identified their description, others struggled and were confused about spirituality versus religion. The session ended with members able to differentiate the meaning of spirituality and religion. Group members reported they had enjoyed the session and the manner of the guest. One drug test was collected during group today while [facility] were returned to others.   Group Time: 2:45- 4pm  Participation Level: Active  Behavioral Response: Appropriate and Sharing  Type of Therapy: Process Group  Topic: Process/Graduation: the second half of group was spent in process. Members shared about the recovery-related things they had done since the last group session. While some reported attending a number of 12-step meetings, others admitted that they had not been to any. The rules of the program, including attending at least 4 12-step meetings per week was emphasized. The session ended with a graduation ceremony. The member graduating hade invited his parents and girlfriend. Also invited were 2 former group members who had previously graduated. Kind words were shared and the session proved very touching to all present.    Summary: The patient shared openly about his Christian faith. He defined spirituality as all about 'relationships' and he pointed out how when he is on a crack run, his relationships have no meaning for him. He agreed that his addiction takes him away from any and all relationships. In process, the patient reported he had completed a job interview on Monday, was hired and actually  worked that night. He also worked last night and came here today with only 3 hours of sleep. The patient agreed that trying to manage work, his recovery, getting enough rest and practicing his faith was challenging. When I expressed concerns about not having attended any 12-step meetings, the patient agreed. He reported having spoken with his temporary sponsor who also expressed concerns and advised him to focus on his recovery. The patient will need to step-up his recovery meetings or he will be discharged from the program. He had kind words of hope and courage for the graduating member and reminded him that he has the skills and gifts to do whatever he wants to do. The patient made some good comments and responded well to this intervention. His sobriety date remains 3/15.    Family Program: Family present? No   Name of family member(s):   UDS collected: No Results:   AA/NA attended?: No  Sponsor?: Yes, a temporary sponsor   Journe Hallmark, LCAS

## 2014-11-10 ENCOUNTER — Other Ambulatory Visit (HOSPITAL_COMMUNITY): Payer: PRIVATE HEALTH INSURANCE | Admitting: Psychology

## 2014-11-10 ENCOUNTER — Other Ambulatory Visit (HOSPITAL_COMMUNITY): Payer: PRIVATE HEALTH INSURANCE

## 2014-11-10 DIAGNOSIS — F1420 Cocaine dependence, uncomplicated: Secondary | ICD-10-CM | POA: Diagnosis not present

## 2014-11-13 ENCOUNTER — Encounter (HOSPITAL_COMMUNITY): Payer: Self-pay | Admitting: Psychology

## 2014-11-13 ENCOUNTER — Other Ambulatory Visit (HOSPITAL_COMMUNITY): Payer: PRIVATE HEALTH INSURANCE

## 2014-11-13 ENCOUNTER — Other Ambulatory Visit (HOSPITAL_COMMUNITY): Payer: PRIVATE HEALTH INSURANCE | Admitting: Licensed Clinical Social Worker

## 2014-11-13 DIAGNOSIS — F1420 Cocaine dependence, uncomplicated: Secondary | ICD-10-CM

## 2014-11-13 NOTE — Progress Notes (Signed)
    Daily Group Progress Note  Program: CD-IOP   Group Time: 1-2:30 pm  Participation Level: Minimal  Behavioral Response: Drowsy  Type of Therapy: Process Group  Topic: Process: the first part of group was spent in process. Members shared about things they had done since the last session that supported their recovery. Any obstacles, temptations or thoughts about using were also disclosed. During group today, the Investment banker, operational met with the new member for a psychiatric assessment.   Group Time: 2:45- 4pm  Participation Level: Minimal  Behavioral Response: Sharing  Type of Therapy: Psycho-education Group  Topic: Family Sculpture/Pictures: the second half of group was spent in a psycho-ed. Those members who had not done so previously, 'Sculpted" their families. They explained the different roles and atmosphere during the sculptures and received good feedback and comments by their fellow group members. Members had brought pictures from their childhoods and group members walked around the room looking at the pictures. Members shared their memories around these pictures and the events that were occurring around them. This represented another opportunity to understand and get to know one another even better.   Summary: The patient reported that since the last group session, "I went to work, came home, ate slept, and then went back to work". He had not been able to sleep today since he got home and admitted he was 'exhausted'. The patient reported he hadn't worked in months and the physical demands of the new job showed he was out of shape. He had not attended any meetings. I expressed my concerns and reminded him that he didn't need really need this job right now. In fact, it was taking away from his recovery. The patient reported he had spoken with a guy from NA and this fellow had expressed the same concerns. I encouraged him to consider giving his all to his recovery and this program for  the next 5 weeks and then he will have a very solid foundation and can then look for work, preferably a daytime work schedule and not 3rd shift. 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 He did not bring any pictures and stated he had not been able to find any. As the session ended, the patient reported he would be going to work tonight, but might be off after about 6-7 hours. He would be off all weekend and planned on attending 4 meetings over the weekend. The patient had come in today with a bad attitude and acting as though he was a victim. He sought this job out and had not mentioned the need to find work prior to reporting about it on Wednesday. He was encouraged to finish out this program strong and then find a job. He has never really done that before and it remains to be seen if he will, once again, find something to focus on that does not include his recovery. The patient's sobriety date remains 3/15.   Family Program: Family present? No   Name of family member(s):   UDS collected: No Results:  AA/NA attended?: No  Sponsor?: Yes, a temporary sponsor   Yilin Weedon, LCAS

## 2014-11-14 ENCOUNTER — Encounter (HOSPITAL_COMMUNITY): Payer: Self-pay | Admitting: Licensed Clinical Social Worker

## 2014-11-14 NOTE — Progress Notes (Addendum)
    Daily Group Progress Note  Program: CD-IOP   Group Time: 1-2:30  Participation Level: Active  Behavioral Response: Appropriate and Sharing  Type of Therapy: Process Group  Topic: After checking in, group members took turns sharing about recovery-related activities and challenges they faced since the last group on Friday. They also shared any urges or cravings they may have experienced the weekend.  Group members were openly engaged in suggestions and feedback to a group member who was returning to the group after a relapse. Two group members were absent. Drug tests were given out and two negative drug tests were returned.  The second part of group focused on cognitive distortions. A staff member from the [employer] facilitated sessions 2 of the 8 week program titled "Down the Rabbit Hole," focusing on cognitive distortions. The facilitator uses multi-media: comic strip and lyrics from a song to show examples of distortions.  Each week 2 cognitive distortions will be discussed along with coping tools to use to change thinking patterns. This week the 2 cognitive distortions discussed were: personalization and blaming. Group members were actively engaged in identifying rational thoughts that can be used to combat the distorted thought and determined how the distortion may have manifested in their own lives.     Group Time: 2:45-4  Participation Level: Active  Behavioral Response: Appropriate and Sharing  Type of Therapy: Psycho-education Group  Topic: The second part of group focused on cognitive distortions. A staff member from the [employer] facilitated sessions 2 of the 8 week program titled "Down the Rabbit Hole," focusing on cognitive distortions. The facilitator uses multi-media: comic strip and lyrics from a song to show examples of distortions.  Each week 2 cognitive distortions will be discussed along with coping tools to use to change thinking  patterns. This week the 2 cognitive distortions discussed were: personalization and blaming. Group members were actively engaged in identifying rational thoughts that can be used to combat the distorted thought and determined how the distortion may have manifested in their own lives.      Summary: Patient worked Friday night after getting minimal sleep the night before. He mowed the yard Saturday and went back to bed. On Sunday he went to 3 meetings. He gave some good feedback to the group member on acceptance, surrender and accountability. The patient was able to identify his personalization distortion: I felt I failed my son; he turned out just like me, in jail and using drugs. It's my fault." He received positive feedback and suggestions from other group members on this distortion. His sobriety date remains 3/15.    Family Program: Family present? No   Name of family member(s):   UDS collected: Yes Results: Pending  AA/NA attended?: YesSunday  Sponsor?: No   Ambar Raphael S, Licensed Cli

## 2014-11-15 ENCOUNTER — Encounter: Payer: Self-pay | Admitting: Psychiatry

## 2014-11-15 ENCOUNTER — Other Ambulatory Visit (HOSPITAL_COMMUNITY): Payer: PRIVATE HEALTH INSURANCE | Admitting: Psychology

## 2014-11-15 ENCOUNTER — Encounter (HOSPITAL_COMMUNITY): Payer: Self-pay | Admitting: Psychology

## 2014-11-15 ENCOUNTER — Other Ambulatory Visit (HOSPITAL_COMMUNITY): Payer: PRIVATE HEALTH INSURANCE

## 2014-11-15 DIAGNOSIS — F10230 Alcohol dependence with withdrawal, uncomplicated: Secondary | ICD-10-CM

## 2014-11-15 DIAGNOSIS — F1420 Cocaine dependence, uncomplicated: Secondary | ICD-10-CM | POA: Diagnosis not present

## 2014-11-15 DIAGNOSIS — Q282 Arteriovenous malformation of cerebral vessels: Secondary | ICD-10-CM

## 2014-11-17 ENCOUNTER — Other Ambulatory Visit (HOSPITAL_COMMUNITY): Payer: PRIVATE HEALTH INSURANCE

## 2014-11-17 ENCOUNTER — Other Ambulatory Visit (HOSPITAL_COMMUNITY): Payer: PRIVATE HEALTH INSURANCE | Admitting: Psychology

## 2014-11-17 DIAGNOSIS — F1420 Cocaine dependence, uncomplicated: Secondary | ICD-10-CM | POA: Diagnosis not present

## 2014-11-20 ENCOUNTER — Other Ambulatory Visit (HOSPITAL_COMMUNITY): Payer: PRIVATE HEALTH INSURANCE

## 2014-11-20 ENCOUNTER — Encounter (HOSPITAL_COMMUNITY): Payer: Self-pay | Admitting: Psychology

## 2014-11-20 NOTE — Psych (Signed)
    Daily Group Progress Note  Program: CD-IOP   Group Time: 1-2:30 pm  Participation Level: Active  Behavioral Response: Sharing  Type of Therapy: Process Group  Topic: Process: the first part of group was spent in process. Members shared about current issues and challenges. They also identified what they had done to support their recovery since the last group session. The results of drug tests were handed out. One test was positive for alcohol and the member was asked about this. A discussion about 'total abstinence" ensued and with the exception of the member who tested positive, all other group members were adamant about the importance of total abstinence.  Group Time: 2:45- 4pm  Participation Level: Active  Behavioral Response: Sharing  Type of Therapy: Psycho-education Group  Topic: Resentments: the second half of group was spent in a psycho-ed on Resentments. A handout was provided to group members and they took turns reading it. Included on the handout, was information about how resentments develop (unresolved anger/hurt) and the destructive nature of resentments for folks in early recovery. A good discussion followed with members sharing about their own resentments and how these 'grudges' manifested in their daily lives. The session included members sharing about what they get from holding onto resentments and why they don't more easily let them go. The disclosures were painful, but honest and the session focused on issues many members had not previously addressed or even been aware of.   Summary: The patient reported he had worked every night since the last group session. He admitted he was tired. He also reported that because of the inconsistent work hours he may have to quit the job. His wife drives him to work and picks him up and his schedule is causing problems with her work schedule. When the patient with the alcohol-positive drug test was confronted, this patient reminded  him that he was in a program and he has an obligation to himself and everyone else here. It was well taken by the young man. In the second half of group the patient admitted that he used to blame everything on everyone else and held all kinds of resentments. He reported he had been very angry at his father for most of his life. When his father became ill and approached death, he explained that he was able to make peace with him before he died. With his mother, he was in his active addiction and tried to get money from her. When she refused, he left very angry. She died before he was able to make amends with her. The patient reported this ate him up and fueled his drug use in the years since then. Finally, in residential treatment in March, he wrote her a letter and was able to forgive her and himself through that process of forgiveness. The patient made some good comments and responded well to this intervention. His sobriety date remains 3/15.    Family Program: Family present? No   Name of family member(s):   UDS collected: No Results:   AA/NA attended?: No  Sponsor?: No   Luberta Grabinski, LCAS

## 2014-11-20 NOTE — Psych (Signed)
    Daily Group Progress Note  Program: CD-IOP   Group Time: 1-2:30 pm  Participation Level: Active  Behavioral Response: Sharing, Rationalizing, Resistant and Blaming  Type of Therapy: Process Group  Topic: Group Process.  The first half of group was spent in process. Members shared about any problems or challenges to their sobriety. Members also identified what they had done since the last group to strengthen their recovery, including attending meetings, interacting with others in recovery, and self-care, such as exercise, diet or self-reflection. During the session today, the program director met with a number of group members.  Group Time: 2:45- 4pm  Participation Level: Active  Behavioral Response: Sharing  Type of Therapy: Psycho-education Group  Topic: Forgiveness/Graduation Ceremony: the second half of group was spent in a psycho-ed on "Forgiveness". Members were provided with handouts and the session moved from resentments, discussed in the last session, to forgiveness. There were good questions about forgiveness, including the time frame and what happens if people aren't ready to forgive. A variety of ways to begin to forgive were offered and members were engaged and shared easily with each other about this struggle. As the session neared an end, a graduation ceremony was held to honor a successfully graduating member. There were kind words offered and the session and week ended with positive and validating session.   Summary: The patient arrived late for group today. He seemed a little distracted and was asked to check-in when the opportunity presented itself. The patient proceeded to explain that he had taken the advice given to him by myself and his wife and "made the decision to quit my job". He proceeded to explain that when he told them at work on Wednesday night, he was terminated and will end up losing almost $400 in reimbursement because of a contract agreement with the  company and the employment agency. He grew more furious about this and seemed to blame me for this additional loss of income. He was undeterred and would not discuss the issue. He sat in the chair, stared off into the distance and was immovable. There was no hint of gratitude, humility or responsibility from this fellow. At break, I spoke with him briefly and suggested he either go home and phone his sponsor and discuss or make a big change in his attitude. The patient did remain in group and was less defensive and provided feedback when appropriate. He had kind words for the graduating member. The patient had not attended any 12-step meetings in over a week and he was defensive about this poor record. He was encouraged to get to some meetings this weekend in order to remain compliant in the program. The patient's sobriety date remains 3/15.    Family Program: Family present? No   Name of family member(s):   UDS collected: No Results:   AA/NA attended?: No, patient is out of compliance with the program  Sponsor?: No   Kashmir Lysaght, LCAS

## 2014-11-22 ENCOUNTER — Telehealth (HOSPITAL_COMMUNITY): Payer: Self-pay | Admitting: Psychology

## 2014-11-22 ENCOUNTER — Other Ambulatory Visit (HOSPITAL_COMMUNITY): Payer: PRIVATE HEALTH INSURANCE

## 2014-11-24 ENCOUNTER — Other Ambulatory Visit (HOSPITAL_COMMUNITY): Payer: PRIVATE HEALTH INSURANCE

## 2014-11-24 NOTE — Progress Notes (Addendum)
  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 The job re[portedly had no set hours so he worked 12-15 hr shifts while tyring to fulfill treatment contract obligations.He became extremely fatigued and agreed with Counselor that this job was not healthy for him at this time. He decided to quit the job without consulting anyone including his sponsor and became resentful when he was informed that he lost a #400.00 incentive by quitting the way he did.He came to group angry and sullen and was counseled to leave the Group early that day.His attitude remained openly negative toward his Counselor in group. He was encouraged to talk to his sponsor and attend meetings but failed to follow thru. He was discharged on Wednesday May 25th for failure to comply with his treatment contact meeting attendance requirements and recommended to seek a higher level of care at the treatment facility of his choice.  Goals and Activities to Help Maintain Sobriety: 1. Stay away from old friends who continue to drink and use mind-altering chemicals. 2. Continue practicing Fair Fighting rules in interpersonal conflicts. 3. Continue alcohol and drug refusal skills and call on support systems. 4.   Seek higher level of care at Treatment facility of choice   Aftercare services:  1. Attend AA/NA meetings at least 3 times per week. 2. Obtain a sponsor and a home group in AA/NA. 3. Return to Psychotherapist:PRN 4. Keep Neurology followup appointments  Next appointment: per Neurology/  Plan of Action  to Address Continuing Problems: see above    Client has NOT participated in the development of this discharge plan and has received a copy of this completed plan  Samuel JoyKOBER, [name]  11/24/2014   Samuel Joyharles E Pamela Maddy [facility] 11/24/2014

## 2014-11-29 ENCOUNTER — Other Ambulatory Visit (HOSPITAL_COMMUNITY): Payer: PRIVATE HEALTH INSURANCE

## 2014-12-01 ENCOUNTER — Other Ambulatory Visit (HOSPITAL_COMMUNITY): Payer: PRIVATE HEALTH INSURANCE

## 2014-12-04 ENCOUNTER — Other Ambulatory Visit (HOSPITAL_COMMUNITY): Payer: PRIVATE HEALTH INSURANCE

## 2014-12-06 ENCOUNTER — Other Ambulatory Visit (HOSPITAL_COMMUNITY): Payer: PRIVATE HEALTH INSURANCE

## 2014-12-08 ENCOUNTER — Other Ambulatory Visit (HOSPITAL_COMMUNITY): Payer: PRIVATE HEALTH INSURANCE

## 2014-12-11 ENCOUNTER — Other Ambulatory Visit (HOSPITAL_COMMUNITY): Payer: PRIVATE HEALTH INSURANCE

## 2014-12-13 ENCOUNTER — Other Ambulatory Visit (HOSPITAL_COMMUNITY): Payer: PRIVATE HEALTH INSURANCE

## 2014-12-15 ENCOUNTER — Other Ambulatory Visit (HOSPITAL_COMMUNITY): Payer: PRIVATE HEALTH INSURANCE

## 2014-12-18 ENCOUNTER — Other Ambulatory Visit (HOSPITAL_COMMUNITY): Payer: PRIVATE HEALTH INSURANCE

## 2014-12-20 ENCOUNTER — Other Ambulatory Visit (HOSPITAL_COMMUNITY): Payer: PRIVATE HEALTH INSURANCE

## 2014-12-22 ENCOUNTER — Other Ambulatory Visit (HOSPITAL_COMMUNITY): Payer: PRIVATE HEALTH INSURANCE

## 2014-12-25 ENCOUNTER — Other Ambulatory Visit (HOSPITAL_COMMUNITY): Payer: PRIVATE HEALTH INSURANCE

## 2014-12-27 ENCOUNTER — Other Ambulatory Visit (HOSPITAL_COMMUNITY): Payer: PRIVATE HEALTH INSURANCE

## 2014-12-29 ENCOUNTER — Other Ambulatory Visit (HOSPITAL_COMMUNITY): Payer: 59

## 2015-01-03 ENCOUNTER — Other Ambulatory Visit (HOSPITAL_COMMUNITY): Payer: 59

## 2015-01-05 ENCOUNTER — Other Ambulatory Visit (HOSPITAL_COMMUNITY): Payer: 59

## 2015-01-08 ENCOUNTER — Other Ambulatory Visit (HOSPITAL_COMMUNITY): Payer: 59

## 2015-01-10 ENCOUNTER — Other Ambulatory Visit (HOSPITAL_COMMUNITY): Payer: 59

## 2015-04-12 ENCOUNTER — Ambulatory Visit: Payer: 59 | Admitting: Adult Health

## 2015-04-16 ENCOUNTER — Ambulatory Visit (INDEPENDENT_AMBULATORY_CARE_PROVIDER_SITE_OTHER): Payer: 59 | Admitting: Adult Health

## 2015-04-16 ENCOUNTER — Encounter: Payer: Self-pay | Admitting: Adult Health

## 2015-04-16 VITALS — BP 112/72 | HR 69 | Ht 73.0 in | Wt 185.0 lb

## 2015-04-16 DIAGNOSIS — G40909 Epilepsy, unspecified, not intractable, without status epilepticus: Secondary | ICD-10-CM

## 2015-04-16 DIAGNOSIS — Z5181 Encounter for therapeutic drug level monitoring: Secondary | ICD-10-CM

## 2015-04-16 MED ORDER — LAMOTRIGINE 100 MG PO TABS: 100.0000 mg | ORAL_TABLET | Freq: Two times a day (BID) | ORAL | Status: DC

## 2015-04-16 NOTE — Progress Notes (Signed)
PATIENT: Samuel Atkinson DOB: 30-Sep-1965  REASON FOR VISIT: follow up- seizures HISTORY FROM: patient  HISTORY OF PRESENT ILLNESS: Samuel Atkinson is a 50 year old male with a history of seizures. He also has a history of alcohol and cocaine abuse. He returns today for follow-up. The patient is currently on Keppra and Lamictal. He reports that he has not had any seizure events since the last visit. He does not operate a motor vehicle. He is able to complete all ADLs independently. Denies any changes with his gait or balance. He does state that occasionally he'll feel a little dizzy but that is not consistent. He states that he has not had alcohol nor cocaine in 2 months. He denies any new neurological symptoms. He returns today for an evaluation.  HISTORY 10/10/14 Samuel Atkinson): Samuel Atkinson is a 49 year old right-handed black male with a history of alcohol and cocaine abuse and a history of seizures. The patient has a cerebral AVM, status post embolization therapy. The patient was just recently in the hospital in March 2016 after a relapse with his drug and alcohol abuse problem. The patient is now off the drugs. He remains on Keppra taking 500 mg twice daily, but he never went up on the medication to a 1500 mg nightly dose of Keppra XR. The patient has not had any recurrent seizures, the last seizure was in September 2015. The patient does operate a motor vehicle on occasion. He returns to this office for an evaluation. Indicates that the Keppra is causing too many side effects of drowsiness, he is not tolerating medication well.   REVIEW OF SYSTEMS: Out of a complete 14 system review of symptoms, the patient complains only of the following symptoms, and all other reviewed systems are negative.  Frequency of urination, urgency, dizziness  ALLERGIES: Allergies  Allergen Reactions  . Latex Rash    HOME MEDICATIONS: Outpatient Prescriptions Prior to Visit  Medication Sig Dispense Refill  . albuterol  (PROVENTIL HFA;VENTOLIN HFA) 108 (90 BASE) MCG/ACT inhaler Inhale 1-2 puffs into the lungs every 6 (six) hours as needed for wheezing or shortness of breath.    Marland Kitchen amLODipine (NORVASC) 10 MG tablet Take 1 tablet (10 mg total) by mouth daily. 30 tablet 0  . B Complex-Biotin-FA (SUPER B-50 COMPLEX) CAPS Take 1 capsule by mouth daily.    . Calcium-Magnesium (CALCIUM MAGNESIUM 750) 300-300 MG TABS Take 1 tablet by mouth daily.  0  . lamoTRIgine (LAMICTAL) 25 MG tablet 1 tablet twice a day for 2 weeks, then take 2 tablets twice a day for 2 weeks, then take 3 tablets twice a day for 2 weeks 180 tablet 1  . levETIRAcetam (KEPPRA XR) 500 MG 24 hr tablet Take 1,000 mg by mouth daily.    . Multiple Vitamin (MULTIVITAMIN WITH MINERALS) TABS tablet Take 1 tablet by mouth daily. 30 tablet 0  . thiamine 100 MG tablet Take 1 tablet (100 mg total) by mouth daily. 30 tablet 0  . tiotropium (SPIRIVA) 18 MCG inhalation capsule Place 1 capsule (18 mcg total) into inhaler and inhale daily as needed (for shortness of breath). 30 capsule 12   No facility-administered medications prior to visit.    PAST MEDICAL HISTORY: Past Medical History  Diagnosis Date  . Hypertension   . Seizure disorder (HCC)   . Hx of substance abuse   . AVM (arteriovenous malformation)   . Seizures (HCC)     PAST SURGICAL HISTORY: Past Surgical History  Procedure Laterality Date  . Avm  embolization  2013    FAMILY HISTORY: Family History  Problem Relation Age of Onset  . Hypertension Father   . Coronary artery disease Father   . Alcohol abuse Father   . Diabetes Mother   . Hypertension Mother   . Coronary artery disease Brother   . Healthy Brother   . Coronary artery disease Brother   . Drug abuse Brother   . Drug abuse Brother     SOCIAL HISTORY: Social History   Social History  . Marital Status: Single    Spouse Name: Samuel PortsValerie  . Number of Children: 3  . Years of Education: GED    Occupational History  .  Unemployed    Social History Main Topics  . Smoking status: Current Every Day Smoker -- 0.50 packs/day    Types: Cigarettes  . Smokeless tobacco: Never Used  . Alcohol Use: 8.4 oz/week    8 Cans of beer, 6 Shots of liquor per week     Comment: Unsure of amount / pt. states he drank $150 worth ETOH in 2 days  . Drug Use: 6.00 per week    Special: Cocaine     Comment: Unsure of amount /states $700 worth in 2 days  . Sexual Activity: Yes    Birth Control/ Protection: None   Other Topics Concern  . Not on file   Social History Narrative   Patient lives at home with Samuel Atkinson(Samuel Atkinson). Patient works full time time.   Education 12 th grade.   Caffeine 3-4 cups of caffeine daily.   Right handed.      PHYSICAL EXAM  Filed Vitals:   04/16/15 1531  BP: 112/72  Pulse: 69  Height: 6\' 1"  (1.854 m)  Weight: 185 lb (83.915 kg)   Body mass index is 24.41 kg/(m^2).  Generalized: Well developed, in no acute distress   Neurological examination  Mentation: Alert oriented to time, place, history taking. Follows all commands speech and language fluent Cranial nerve II-XII: Pupils were equal round reactive to light. Extraocular movements were full, visual field were full on confrontational test. Facial sensation and strength were normal. Uvula tongue midline. Head turning and shoulder shrug  were normal and symmetric. Motor: The motor testing reveals 5 over 5 strength of all 4 extremities. Good symmetric motor tone is noted throughout.  Sensory: Sensory testing is intact to soft touch on all 4 extremities. No evidence of extinction is noted.  Coordination: Cerebellar testing reveals good finger-nose-finger and heel-to-shin bilaterally.  Gait and station: Gait is normal. Tandem gait is normal. Romberg is negative. No drift is seen.  Reflexes: Deep tendon reflexes are symmetric and normal bilaterally.   DIAGNOSTIC DATA (LABS, IMAGING, TESTING) - I reviewed patient records, labs, notes, testing and  imaging myself where available.  Lab Results  Component Value Date   WBC 9.3 09/09/2014   HGB 15.8 09/09/2014   HCT 44.3 09/09/2014   MCV 93.3 09/09/2014   PLT 218 09/09/2014      Component Value Date/Time   NA 139 09/09/2014 0223   K 4.2 09/09/2014 0223   CL 107 09/09/2014 0223   CO2 22 09/09/2014 0223   GLUCOSE 67* 09/09/2014 0223   BUN 19 09/09/2014 0223   CREATININE 1.24 09/09/2014 0223   CALCIUM 8.8 09/09/2014 0223   PROT 7.2 09/09/2014 0223   ALBUMIN 4.3 09/09/2014 0223   AST 62* 09/09/2014 0223   ALT 32 09/09/2014 0223   ALKPHOS 68 09/09/2014 0223   BILITOT 0.8 09/09/2014 0223  GFRNONAA 67* 09/09/2014 0223   GFRAA 77* 09/09/2014 0223      ASSESSMENT AND PLAN 49 y.o. year old male  has a past medical history of Hypertension; Seizure disorder (HCC); substance abuse; AVM (arteriovenous malformation); and Seizures (HCC). here with:  1. Seizures  Overall the patient is doing well. I will increase his Lamictal 200 mg twice a day. He will continue on Keppra. I will check blood work today. Patient advised that if he has any seizure events he should let us know. Patient encouraged to remain off of alcohol and cocaine. He will follow-up in 6 months or sooner if needed.  Butch Penny, MSN, NP-C 04/16/2015, 3:56 PM [facility] [address], [address] [place], [place] [address] [phone]

## 2015-04-16 NOTE — Patient Instructions (Signed)
Continue lamictal and Keppra I will check blood work today If you have any seizures let us know.

## 2015-04-16 NOTE — Progress Notes (Signed)
I have read the note, and I agree with the clinical assessment and plan.  WILLIS,CHARLES KEITH   

## 2015-04-17 ENCOUNTER — Telehealth: Payer: Self-pay | Admitting: Adult Health

## 2015-04-17 LAB — CBC WITH DIFFERENTIAL/PLATELET
BASOS ABS: 0 10*3/uL (ref 0.0–0.2)
Basos: 1 %
EOS (ABSOLUTE): 0 10*3/uL (ref 0.0–0.4)
Eos: 1 %
Hematocrit: 49.4 % (ref 37.5–51.0)
Hemoglobin: 17.3 g/dL (ref 12.6–17.7)
Immature Grans (Abs): 0 10*3/uL (ref 0.0–0.1)
Immature Granulocytes: 0 %
LYMPHS ABS: 2.2 10*3/uL (ref 0.7–3.1)
Lymphs: 49 %
MCH: 32.6 pg (ref 26.6–33.0)
MCHC: 35 g/dL (ref 31.5–35.7)
MCV: 93 fL (ref 79–97)
MONOS ABS: 0.4 10*3/uL (ref 0.1–0.9)
Monocytes: 9 %
Neutrophils Absolute: 1.7 10*3/uL (ref 1.4–7.0)
Neutrophils: 40 %
Platelets: 210 10*3/uL (ref 150–379)
RBC: 5.31 x10E6/uL (ref 4.14–5.80)
RDW: 13.2 % (ref 12.3–15.4)
WBC: 4.4 10*3/uL (ref 3.4–10.8)

## 2015-04-17 LAB — COMPREHENSIVE METABOLIC PANEL
ALBUMIN: 4.2 g/dL (ref 3.5–5.5)
ALK PHOS: 75 IU/L (ref 39–117)
ALT: 14 IU/L (ref 0–44)
AST: 19 IU/L (ref 0–40)
Albumin/Globulin Ratio: 1.7 (ref 1.1–2.5)
BUN / CREAT RATIO: 15 (ref 9–20)
BUN: 16 mg/dL (ref 6–24)
Bilirubin Total: 0.3 mg/dL (ref 0.0–1.2)
CO2: 22 mmol/L (ref 18–29)
Calcium: 9.2 mg/dL (ref 8.7–10.2)
Chloride: 102 mmol/L (ref 97–106)
Creatinine, Ser: 1.09 mg/dL (ref 0.76–1.27)
GFR calc Af Amer: 92 mL/min/{1.73_m2} (ref >59)
GFR calc non Af Amer: 79 mL/min/{1.73_m2} (ref >59)
GLOBULIN, TOTAL: 2.5 g/dL (ref 1.5–4.5)
GLUCOSE: 96 mg/dL (ref 65–99)
POTASSIUM: 4.3 mmol/L (ref 3.5–5.2)
SODIUM: 139 mmol/L (ref 136–144)
Total Protein: 6.7 g/dL (ref 6.0–8.5)

## 2015-04-17 LAB — LAMOTRIGINE LEVEL: LAMOTRIGINE LVL: 1.5 ug/mL — AB (ref 2.0–20.0)

## 2015-04-17 MED ORDER — LAMOTRIGINE 100 MG PO TABS: 100.0000 mg | ORAL_TABLET | Freq: Two times a day (BID) | ORAL | Status: DC

## 2015-04-17 NOTE — Telephone Encounter (Signed)
[name]/Wife [phone] called and said that when patient was here yesterday Aundra MilletMegan was going to send Rx into the Pharmacy for lamoTRIgine (LAMICTAL) 100 MG tablet however [facility] does not have the Rx.

## 2015-04-17 NOTE — Telephone Encounter (Signed)
Pharmacy updated in system and Rx has been resent.  Receipt confirmed by pharmacy.  Tad MooreCasandra has spoken with patient's spouse.

## 2015-04-18 ENCOUNTER — Telehealth: Payer: Self-pay

## 2015-04-18 NOTE — Telephone Encounter (Signed)
-----   Message from Butch PennyMegan Millikan, NP sent at 04/17/2015  4:53 PM EDT ----- Lab work ok. lamictal level is slightly low. We will monitor. Please call patient

## 2015-04-18 NOTE — Telephone Encounter (Signed)
Spoke to spouse. Gave lab results. Spouse verbalized understanding.   

## 2015-05-30 ENCOUNTER — Other Ambulatory Visit: Payer: Self-pay

## 2015-05-30 MED ORDER — LAMOTRIGINE 100 MG PO TABS: 100.0000 mg | ORAL_TABLET | Freq: Two times a day (BID) | ORAL | Status: DC

## 2015-08-24 ENCOUNTER — Ambulatory Visit
Admission: RE | Admit: 2015-08-24 | Discharge: 2015-08-24 | Disposition: A | Discharge status: Home / Self Care | Payer: 59 | Source: Ambulatory Visit | Attending: Family | Admitting: Family

## 2015-08-24 ENCOUNTER — Other Ambulatory Visit: Payer: Self-pay | Admitting: Family

## 2015-08-24 DIAGNOSIS — R0781 Pleurodynia: Secondary | ICD-10-CM

## 2015-08-24 DIAGNOSIS — F17200 Nicotine dependence, unspecified, uncomplicated: Secondary | ICD-10-CM

## 2015-09-05 ENCOUNTER — Telehealth: Payer: Self-pay | Admitting: Neurology

## 2015-09-05 DIAGNOSIS — Q282 Arteriovenous malformation of cerebral vessels: Secondary | ICD-10-CM

## 2015-09-05 NOTE — Telephone Encounter (Signed)
I called patient, left a message, I will call back later. 

## 2015-09-05 NOTE — Telephone Encounter (Signed)
I called the patient. The [facility] He does not wish to have further follow-up there, wishes to have a referral locally. I will get a referral set up for Dr. Corliss Skainseveshwar. The patient has had embolization of the AVM previously.

## 2015-09-05 NOTE — Telephone Encounter (Signed)
Patient called to request that Dr. Anne HahnWillis refer him to a local NeuroSurgeon, so that he can have yearly Angiogram with and without contrast, instead of [facility].

## 2015-09-25 ENCOUNTER — Encounter (HOSPITAL_COMMUNITY): Payer: Self-pay | Admitting: *Deleted

## 2015-09-25 ENCOUNTER — Emergency Department (INDEPENDENT_AMBULATORY_CARE_PROVIDER_SITE_OTHER)
Admission: EM | Admit: 2015-09-25 | Discharge: 2015-09-25 | Disposition: A | Discharge status: Home / Self Care | Payer: 59 | Source: Home / Self Care | Attending: Family Medicine | Admitting: Family Medicine

## 2015-09-25 DIAGNOSIS — S39012A Strain of muscle, fascia and tendon of lower back, initial encounter: Secondary | ICD-10-CM | POA: Diagnosis not present

## 2015-09-25 MED ORDER — KETOROLAC TROMETHAMINE 30 MG/ML IJ SOLN
30.0000 mg | Freq: Once | INTRAMUSCULAR | Status: AC
  Administered 2015-09-25: 30 mg via INTRAMUSCULAR

## 2015-09-25 MED ORDER — DICLOFENAC POTASSIUM 50 MG PO TABS: 50.0000 mg | ORAL_TABLET | Freq: Three times a day (TID) | ORAL | Status: DC

## 2015-09-25 MED ORDER — KETOROLAC TROMETHAMINE 30 MG/ML IJ SOLN
INTRAMUSCULAR | Status: AC
  Filled 2015-09-25: qty 1

## 2015-09-25 MED ORDER — METAXALONE 800 MG PO TABS: 800.0000 mg | ORAL_TABLET | Freq: Three times a day (TID) | ORAL | Status: DC

## 2015-09-25 NOTE — ED Notes (Signed)
Pt  Reports  Back  Pain  After   Working in  [employer]      3  Days  Ago  -  Pt  Reports  Pain  Is        Worse  On  Movement  And  posistions      Pt   Reports  Symptoms    Not  releived         By  [facility]  meds         denys  Any  Urinary  Symptoms    -

## 2015-09-25 NOTE — ED Provider Notes (Signed)
CSN: [id number]     Arrival date & time 09/25/15  1359 History   First MD Initiated Contact with Patient 09/25/15 1616     Chief Complaint  Patient presents with  . Back Pain   (Consider location/radiation/quality/duration/timing/severity/associated sxs/prior Treatment) Patient is a 50 y.o. male presenting with back pain. The history is provided by the patient.  Back Pain Location:  Lumbar spine Quality:  Stiffness Stiffness is present:  All day Radiates to:  Does not radiate Pain severity:  Moderate Onset quality:  Gradual Progression:  Unchanged Chronicity:  New Context: lifting heavy objects   Context comment:  Aerating yard and machine pulled him around yard.   Past Medical History  Diagnosis Date  . Hypertension   . Seizure disorder (HCC)   . Hx of substance abuse   . AVM (arteriovenous malformation)   . Seizures [facility])    Past Surgical History  Procedure Laterality Date  . Avm embolization  2013   Family History  Problem Relation Age of Onset  . Hypertension Father   . Coronary artery disease Father   . Alcohol abuse Father   . Diabetes Mother   . Hypertension Mother   . Coronary artery disease Brother   . Healthy Brother   . Coronary artery disease Brother   . Drug abuse Brother   . Drug abuse Brother    Social History  Substance Use Topics  . Smoking status: Current Every Day Smoker -- 0.50 packs/day    Types: Cigarettes  . Smokeless tobacco: Never Used  . Alcohol Use: 8.4 oz/week    8 Cans of beer, 6 Shots of liquor per week     Comment: Unsure of amount / pt. states he drank $150 worth ETOH in 2 days    Review of Systems  Constitutional: Negative.   Cardiovascular: Negative.   Gastrointestinal: Negative.   Genitourinary: Negative.   Musculoskeletal: Positive for myalgias and back pain. Negative for joint swelling and gait problem.  Skin: Negative.   All other systems reviewed and are negative.   Allergies  Latex  Home Medications    Prior to Admission medications   Medication Sig Start Date End Date Taking? Authorizing Provider  albuterol (PROVENTIL HFA;VENTOLIN HFA) 108 (90 BASE) MCG/ACT inhaler Inhale 1-2 puffs into the lungs every 6 (six) hours as needed for wheezing or shortness of breath. 08/03/14   Adonis Brook, NP  amLODipine (NORVASC) 10 MG tablet Take 1 tablet (10 mg total) by mouth daily. 09/11/14   Thermon Leyland, NP  B Complex-Biotin-FA (SUPER B-50 COMPLEX) CAPS Take 1 capsule by mouth daily. 09/11/14   Thermon Leyland, NP  Calcium-Magnesium (CALCIUM MAGNESIUM 750) 300-300 MG TABS Take 1 tablet by mouth daily. 09/11/14   Thermon Leyland, NP  diclofenac (CATAFLAM) 50 MG tablet Take 1 tablet (50 mg total) by mouth 3 (three) times daily. For back pain 09/25/15   Linna Hoff, MD  lamoTRIgine (LAMICTAL) 100 MG tablet Take 1 tablet (100 mg total) by mouth 2 (two) times daily. 05/30/15   Butch Penny, NP  levETIRAcetam (KEPPRA XR) 500 MG 24 hr tablet Take 1,000 mg by mouth daily.    Historical Provider, MD  metaxalone (SKELAXIN) 800 MG tablet Take 1 tablet (800 mg total) by mouth 3 (three) times daily. For muscle spasm 09/25/15   Linna Hoff, MD  Multiple Vitamin (MULTIVITAMIN WITH MINERALS) TABS tablet Take 1 tablet by mouth daily. 09/11/14   Thermon Leyland, NP  thiamine 100 MG  tablet Take 1 tablet (100 mg total) by mouth daily. 09/11/14   Thermon LeylandLaura A Davis, NP  tiotropium (SPIRIVA) 18 MCG inhalation capsule Place 1 capsule (18 mcg total) into inhaler and inhale daily as needed (for shortness of breath). 08/03/14   Adonis BrookSheila Agustin, NP   Meds Ordered and Administered this Visit   Medications  ketorolac (TORADOL) 30 MG/ML injection 30 mg (not administered)    BP 112/62 mmHg  Pulse 59  Temp(Src) 97.6 F (36.4 C) (Oral)  Resp 16  SpO2 100% No data found.   Physical Exam  Constitutional: He is oriented to person, place, and time.  Abdominal: Soft. Bowel sounds are normal.  Musculoskeletal: He exhibits tenderness.        Lumbar back: He exhibits decreased range of motion, tenderness, bony tenderness, pain and spasm. He exhibits no swelling and normal pulse.  Neurological: He is alert and oriented to person, place, and time.  Skin: Skin is warm and dry.  Nursing note and vitals reviewed.   ED Course  Procedures (including critical care time)  Labs Review Labs Reviewed - No data to display  Imaging Review No results found.   Visual Acuity Review  Right Eye Distance:   Left Eye Distance:   Bilateral Distance:    Right Eye Near:   Left Eye Near:    Bilateral Near:         MDM   1. Low back strain, initial encounter    Meds ordered this encounter  Medications  . ketorolac (TORADOL) 30 MG/ML injection 30 mg    Sig:   . metaxalone (SKELAXIN) 800 MG tablet    Sig: Take 1 tablet (800 mg total) by mouth 3 (three) times daily. For muscle spasm    Dispense:  30 tablet    Refill:  0  . diclofenac (CATAFLAM) 50 MG tablet    Sig: Take 1 tablet (50 mg total) by mouth 3 (three) times daily. For back pain    Dispense:  30 tablet    Refill:  0       Linna HoffJames D Kyndle Schlender, MD 09/25/15 [phone]

## 2015-10-17 ENCOUNTER — Encounter: Payer: Self-pay | Admitting: Adult Health

## 2015-10-17 ENCOUNTER — Ambulatory Visit (INDEPENDENT_AMBULATORY_CARE_PROVIDER_SITE_OTHER): Payer: 59 | Admitting: Adult Health

## 2015-10-17 VITALS — BP 107/67 | HR 73 | Resp 16 | Ht 73.0 in | Wt 184.0 lb

## 2015-10-17 DIAGNOSIS — Z5181 Encounter for therapeutic drug level monitoring: Secondary | ICD-10-CM | POA: Diagnosis not present

## 2015-10-17 DIAGNOSIS — Q282 Arteriovenous malformation of cerebral vessels: Secondary | ICD-10-CM | POA: Diagnosis not present

## 2015-10-17 DIAGNOSIS — R569 Unspecified convulsions: Secondary | ICD-10-CM

## 2015-10-17 NOTE — Patient Instructions (Signed)
Continue lamitcal and keppra Blood work today If your symptoms worsen or you develop new symptoms please let us know.

## 2015-10-17 NOTE — Progress Notes (Signed)
PATIENT: Samuel Atkinson DOB: 06/06/1966  REASON FOR VISIT: follow up- seizures HISTORY FROM: patient  HISTORY OF PRESENT ILLNESS: Samuel Atkinson is a 50 year old male with a history of seizures. He returns today for follow-up. The patient continues on Keppra and Lamictal. He is tolerating this medication well. He denies any seizure events. He states that his last seizure was over a year ago. He does not operate a motor vehicle. He is able to complete all ADLs independently. His wife reports that his mood has changed slightly. 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 The patient states that he has been sober from alcohol and cocaine for 8 months. He denies any new neurological symptoms. He returns today for an evaluation.  HISTORY 04/16/15 (MM): Samuel Atkinson is a 50 year old male with a history of seizures. He also has a history of alcohol and cocaine abuse. He returns today for follow-up. The patient is currently on Keppra and Lamictal. He reports that he has not had any seizure events since the last visit. He does not operate a motor vehicle. He is able to complete all ADLs independently. Denies any changes with his gait or balance. He does state that occasionally he'll feel a little dizzy but that is not consistent. He states that he has not had alcohol nor cocaine in 2 months. He denies any new neurological symptoms. He returns today for an evaluation.  HISTORY 10/10/14 Samuel Pace(Wills): Samuel Atkinson is a 50 year old right-handed black male with a history of alcohol and cocaine abuse and a history of seizures. The patient has a cerebral AVM, status post embolization therapy. The patient was just recently in the hospital in March 2016 after a relapse with his drug and alcohol abuse problem. The patient is now off the drugs. He remains on Keppra taking 500 mg twice daily, but he never went up on the medication to  a 1500 mg nightly dose of Keppra XR. The patient has not had any recurrent seizures, the last seizure was in September 2015. The patient does operate a motor vehicle on occasion. He returns to this office for an evaluation. Indicates that the Keppra is causing too many side effects of drowsiness, he is not tolerating medication well.    REVIEW OF SYSTEMS: Out of a complete 14 system review of symptoms, the patient complains only of the following symptoms, and all other reviewed systems are negative.  See history of present illness ALLERGIES: Allergies  Allergen Reactions  . Latex Rash    HOME MEDICATIONS: Outpatient Prescriptions Prior to Visit  Medication Sig Dispense Refill  . albuterol (PROVENTIL HFA;VENTOLIN HFA) 108 (90 BASE) MCG/ACT inhaler Inhale 1-2 puffs into the lungs every 6 (six) hours as needed for wheezing or shortness of breath.    Marland Kitchen. amLODipine (NORVASC) 10 MG tablet Take 1 tablet (10 mg total) by mouth daily. 30 tablet 0  . B Complex-Biotin-FA (SUPER B-50 COMPLEX) CAPS Take 1 capsule by mouth daily.    . Calcium-Magnesium (CALCIUM MAGNESIUM 750) 300-300 MG TABS Take 1 tablet by mouth daily.  0  . diclofenac (CATAFLAM) 50 MG tablet Take 1 tablet (50 mg total) by mouth 3 (three) times daily. For back pain 30 tablet 0  . lamoTRIgine (LAMICTAL) 100 MG tablet Take 1 tablet (100 mg total) by mouth 2 (two) times daily. 180 tablet 1  . levETIRAcetam (KEPPRA XR) 500 MG 24 hr tablet  Take 1,000 mg by mouth daily.    . metaxalone (SKELAXIN) 800 MG tablet Take 1 tablet (800 mg total) by mouth 3 (three) times daily. For muscle spasm 30 tablet 0  . Multiple Vitamin (MULTIVITAMIN WITH MINERALS) TABS tablet Take 1 tablet by mouth daily. 30 tablet 0  . thiamine 100 MG tablet Take 1 tablet (100 mg total) by mouth daily. 30 tablet 0  . tiotropium (SPIRIVA) 18 MCG inhalation capsule Place 1 capsule (18 mcg total) into inhaler and inhale daily as needed (for shortness of breath). 30 capsule 12    No facility-administered medications prior to visit.    PAST MEDICAL HISTORY: Past Medical History  Diagnosis Date  . Hypertension   . Seizure disorder (HCC)   . Hx of substance abuse   . AVM (arteriovenous malformation)   . Seizures (HCC)     PAST SURGICAL HISTORY: Past Surgical History  Procedure Laterality Date  . Avm embolization  2013    FAMILY HISTORY: Family History  Problem Relation Age of Onset  . Hypertension Father   . Coronary artery disease Father   . Alcohol abuse Father   . Diabetes Mother   . Hypertension Mother   . Coronary artery disease Brother   . Healthy Brother   . Coronary artery disease Brother   . Drug abuse Brother   . Drug abuse Brother     SOCIAL HISTORY: Social History   Social History  . Marital Status: Single    Spouse Name: [name]  . Number of Children: 3  . Years of Education: GED    Occupational History  . Unemployed    Social History Main Topics  . Smoking status: Current Every Day Smoker -- 0.50 packs/day    Types: Cigarettes  . Smokeless tobacco: Never Used  . Alcohol Use: 8.4 oz/week    8 Cans of beer, 6 Shots of liquor per week     Comment: Unsure of amount / pt. states he drank $150 worth ETOH in 2 days  . Drug Use: 6.00 per week    Special: Cocaine     Comment: Unsure of amount /states $700 worth in 2 days  . Sexual Activity: Yes    Birth Control/ Protection: None   Other Topics Concern  . Not on file   Social History Narrative   Patient lives at home with [name]). Patient works full time time.   Education 12 th grade.   Caffeine 3-4 cups of caffeine daily.   Right handed.      PHYSICAL EXAM  Filed Vitals:   10/17/15 0733  BP: 107/67  Pulse: 73  Resp: 16  Height:  (1.854 m)  Weight: 184 lb (83.462 kg)   Body mass index is 24.28 kg/(m^2).  Generalized: Well developed, in no acute distress   Neurological examination  Mentation: Alert oriented to time, place, history taking. Follows  all commands speech and language fluent Cranial nerve II-XII: Pupils were equal round reactive to light. Extraocular movements were full, visual field were full on confrontational test. Facial sensation and strength were normal. Uvula tongue midline. Head turning and shoulder shrug  were normal and symmetric. Motor: The motor testing reveals 5 over 5 strength of all 4 extremities. Good symmetric motor tone is noted throughout.  Sensory: Sensory testing is intact to soft touch on all 4 extremities. No evidence of extinction is noted.  Coordination: Cerebellar testing reveals good finger-nose-finger and heel-to-shin bilaterally.  Gait and station: Gait is normal. Tandem gait  is normal. Romberg is negative. No drift is seen.  Reflexes: Deep tendon reflexes are symmetric and normal bilaterally.   DIAGNOSTIC DATA (LABS, IMAGING, TESTING) - I reviewed patient records, labs, notes, testing and imaging myself where available.  Lab Results  Component Value Date   WBC 4.4 04/16/2015   HGB 15.8 09/09/2014   HCT 49.4 04/16/2015   MCV 93 04/16/2015   PLT 210 04/16/2015      Component Value Date/Time   NA 139 04/16/2015 1623   NA 139 09/09/2014 0223   K 4.3 04/16/2015 1623   CL 102 04/16/2015 1623   CO2 22 04/16/2015 1623   GLUCOSE 96 04/16/2015 1623   GLUCOSE 67* 09/09/2014 0223   BUN 16 04/16/2015 1623   BUN 19 09/09/2014 0223   CREATININE 1.09 04/16/2015 1623   CALCIUM 9.2 04/16/2015 1623   PROT 6.7 04/16/2015 1623   PROT 7.2 09/09/2014 0223   ALBUMIN 4.2 04/16/2015 1623   ALBUMIN 4.3 09/09/2014 0223   AST 19 04/16/2015 1623   ALT 14 04/16/2015 1623   ALKPHOS 75 04/16/2015 1623   BILITOT 0.3 04/16/2015 1623   BILITOT 0.8 09/09/2014 0223   GFRNONAA 79 04/16/2015 1623   GFRAA 92 04/16/2015 1623      ASSESSMENT AND PLAN 50 y.o. year old male  has a past medical history of Hypertension; Seizure disorder (HCC); substance abuse; AVM (arteriovenous malformation); and Seizures (HCC).  here with:  1. Seizures 2. Cerebral AVM  She will remain on Keppra and Lamictal. I will check blood work today. The patient wishes to be seen for cerevral AVM here locally. A referral has been sent to Dr. Corliss Skains. I will have this referral refaxed. I will check blood work today. Patient advised that if his symptoms worsen or he develops any new symptoms he should let us know. He will follow-up in 6 months with Dr. Anne Hahn.     Butch Penny, MSN, NP-C 10/17/2015, 7:35 AM [facility] [address], [address] [place], [place] [address] [phone]

## 2015-10-17 NOTE — Progress Notes (Signed)
I have read the note, and I agree with the clinical assessment and plan.  Samuel Atkinson   

## 2015-10-18 ENCOUNTER — Telehealth: Payer: Self-pay | Admitting: Adult Health

## 2015-10-18 LAB — COMPREHENSIVE METABOLIC PANEL
ALK PHOS: 60 IU/L (ref 39–117)
ALT: 19 IU/L (ref 0–44)
AST: 17 IU/L (ref 0–40)
Albumin/Globulin Ratio: 1.9 (ref 1.2–2.2)
Albumin: 4.2 g/dL (ref 3.5–5.5)
BUN/Creatinine Ratio: 12 (ref 9–20)
BUN: 14 mg/dL (ref 6–24)
Bilirubin Total: 0.5 mg/dL (ref 0.0–1.2)
CO2: 22 mmol/L (ref 18–29)
CREATININE: 1.14 mg/dL (ref 0.76–1.27)
Calcium: 9 mg/dL (ref 8.7–10.2)
Chloride: 103 mmol/L (ref 96–106)
GFR calc Af Amer: 86 mL/min/{1.73_m2} (ref >59)
GFR, EST NON AFRICAN AMERICAN: 75 mL/min/{1.73_m2} (ref >59)
Globulin, Total: 2.2 g/dL (ref 1.5–4.5)
Glucose: 103 mg/dL — ABNORMAL HIGH (ref 65–99)
POTASSIUM: 4.4 mmol/L (ref 3.5–5.2)
SODIUM: 142 mmol/L (ref 134–144)
Total Protein: 6.4 g/dL (ref 6.0–8.5)

## 2015-10-18 LAB — CBC WITH DIFFERENTIAL/PLATELET
BASOS: 0 %
Basophils Absolute: 0 10*3/uL (ref 0.0–0.2)
EOS (ABSOLUTE): 0 10*3/uL (ref 0.0–0.4)
Eos: 1 %
Hematocrit: 47.1 % (ref 37.5–51.0)
Hemoglobin: 16.5 g/dL (ref 12.6–17.7)
IMMATURE GRANS (ABS): 0 10*3/uL (ref 0.0–0.1)
IMMATURE GRANULOCYTES: 0 %
LYMPHS: 43 %
Lymphocytes Absolute: 1.6 10*3/uL (ref 0.7–3.1)
MCH: 33.2 pg — ABNORMAL HIGH (ref 26.6–33.0)
MCHC: 35 g/dL (ref 31.5–35.7)
MCV: 95 fL (ref 79–97)
Monocytes Absolute: 0.3 10*3/uL (ref 0.1–0.9)
Monocytes: 8 %
Neutrophils Absolute: 1.7 10*3/uL (ref 1.4–7.0)
Neutrophils: 48 %
Platelets: 173 10*3/uL (ref 150–379)
RBC: 4.97 x10E6/uL (ref 4.14–5.80)
RDW: 13.5 % (ref 12.3–15.4)
WBC: 3.6 10*3/uL (ref 3.4–10.8)

## 2015-10-18 LAB — LAMOTRIGINE LEVEL: LAMOTRIGINE LVL: NOT DETECTED ug/mL (ref 2.0–20.0)

## 2015-10-18 NOTE — Telephone Encounter (Signed)
I called the patient left voice mail asking that he call the office in regards to lab results.

## 2015-10-21 ENCOUNTER — Emergency Department (HOSPITAL_COMMUNITY)
Admission: EM | Admit: 2015-10-21 | Discharge: 2015-10-21 | Disposition: A | Discharge status: Home / Self Care | Payer: 59 | Attending: Emergency Medicine | Admitting: Emergency Medicine

## 2015-10-21 ENCOUNTER — Encounter (HOSPITAL_COMMUNITY): Payer: Self-pay | Admitting: Nurse Practitioner

## 2015-10-21 DIAGNOSIS — F1010 Alcohol abuse, uncomplicated: Secondary | ICD-10-CM | POA: Insufficient documentation

## 2015-10-21 DIAGNOSIS — I10 Essential (primary) hypertension: Secondary | ICD-10-CM | POA: Diagnosis not present

## 2015-10-21 DIAGNOSIS — F17210 Nicotine dependence, cigarettes, uncomplicated: Secondary | ICD-10-CM | POA: Diagnosis not present

## 2015-10-21 DIAGNOSIS — Z791 Long term (current) use of non-steroidal anti-inflammatories (NSAID): Secondary | ICD-10-CM | POA: Diagnosis not present

## 2015-10-21 DIAGNOSIS — G40909 Epilepsy, unspecified, not intractable, without status epilepticus: Secondary | ICD-10-CM | POA: Diagnosis not present

## 2015-10-21 DIAGNOSIS — Z79899 Other long term (current) drug therapy: Secondary | ICD-10-CM | POA: Diagnosis not present

## 2015-10-21 DIAGNOSIS — Z91040 Latex allergy status: Secondary | ICD-10-CM | POA: Insufficient documentation

## 2015-10-21 DIAGNOSIS — Z7289 Other problems related to lifestyle: Secondary | ICD-10-CM

## 2015-10-21 DIAGNOSIS — F1090 Alcohol use, unspecified, uncomplicated: Secondary | ICD-10-CM

## 2015-10-21 DIAGNOSIS — Z8774 Personal history of (corrected) congenital malformations of heart and circulatory system: Secondary | ICD-10-CM | POA: Insufficient documentation

## 2015-10-21 DIAGNOSIS — F1910 Other psychoactive substance abuse, uncomplicated: Secondary | ICD-10-CM

## 2015-10-21 LAB — CBC
HEMATOCRIT: 45.9 % (ref 39.0–52.0)
HEMOGLOBIN: 16.7 g/dL (ref 13.0–17.0)
MCH: 33.1 pg (ref 26.0–34.0)
MCHC: 36.4 g/dL — AB (ref 30.0–36.0)
MCV: 91.1 fL (ref 78.0–100.0)
Platelets: 204 10*3/uL (ref 150–400)
RBC: 5.04 MIL/uL (ref 4.22–5.81)
RDW: 12.6 % (ref 11.5–15.5)
WBC: 8.4 10*3/uL (ref 4.0–10.5)

## 2015-10-21 LAB — COMPREHENSIVE METABOLIC PANEL
ALBUMIN: 4.4 g/dL (ref 3.5–5.0)
ALT: 23 U/L (ref 17–63)
AST: 51 U/L — AB (ref 15–41)
Alkaline Phosphatase: 65 U/L (ref 38–126)
Anion gap: 14 (ref 5–15)
BUN: 11 mg/dL (ref 6–20)
CHLORIDE: 101 mmol/L (ref 101–111)
CO2: 24 mmol/L (ref 22–32)
Calcium: 9.8 mg/dL (ref 8.9–10.3)
Creatinine, Ser: 1.05 mg/dL (ref 0.61–1.24)
GFR calc Af Amer: >60 mL/min (ref >60)
GFR calc non Af Amer: >60 mL/min (ref >60)
GLUCOSE: 79 mg/dL (ref 65–99)
POTASSIUM: 3.9 mmol/L (ref 3.5–5.1)
Sodium: 139 mmol/L (ref 135–145)
TOTAL PROTEIN: 7.7 g/dL (ref 6.5–8.1)
Total Bilirubin: 1.5 mg/dL — ABNORMAL HIGH (ref 0.3–1.2)

## 2015-10-21 LAB — ETHANOL: Alcohol, Ethyl (B): <5 mg/dL (ref <5)

## 2015-10-21 LAB — ACETAMINOPHEN LEVEL: Acetaminophen (Tylenol), Serum: <10 ug/mL — ABNORMAL LOW (ref 10–30)

## 2015-10-21 LAB — SALICYLATE LEVEL: Salicylate Lvl: <4 mg/dL (ref 2.8–30.0)

## 2015-10-21 MED ORDER — ACETAMINOPHEN 500 MG PO TABS
1000.0000 mg | ORAL_TABLET | Freq: Once | ORAL | Status: AC
  Administered 2015-10-21: 1000 mg via ORAL
  Filled 2015-10-21: qty 2

## 2015-10-21 NOTE — ED Notes (Signed)
Pt is aware we need a urine sample. A urinal is placed on a stool sitting beside the bed.

## 2015-10-21 NOTE — Discharge Instructions (Signed)
Polysubstance Abuse When people abuse more than one drug or type of drug it is called polysubstance or polydrug abuse. For example, many smokers also drink alcohol. This is one form of polydrug abuse. Polydrug abuse also refers to the use of a drug to counteract an unpleasant effect produced by another drug. It may also be used to help with withdrawal from another drug. People who take stimulants may become agitated. Sometimes this agitation is countered with a tranquilizer. This helps protect against the unpleasant side effects. Polydrug abuse also refers to the use of different drugs at the same time.  Anytime drug use is interfering with normal living activities, it has become abuse. This includes problems with family and friends. Psychological dependence has developed when your mind tells you that the drug is needed. This is usually followed by physical dependence which has developed when continuing increases of drug are required to get the same feeling or "high". This is known as addiction or chemical dependency. A person's risk is much higher if there is a history of chemical dependency in the family. SIGNS OF CHEMICAL DEPENDENCY  You have been told by friends or family that drugs have become a problem.  You fight when using drugs.  You are having blackouts (not remembering what you do while using).  You feel sick from using drugs but continue using.  You lie about use or amounts of drugs (chemicals) used.  You need chemicals to get you going.  You are suffering in work performance or in school because of drug use.  You get sick from use of drugs but continue to use anyway.  You need drugs to relate to people or feel comfortable in social situations.  You use drugs to forget problems. "Yes" answered to any of the above signs of chemical dependency indicates there are problems. The longer the use of drugs continues, the greater the problems will become. If there is a family history of  drug or alcohol use, it is best not to experiment with these drugs. Continual use leads to tolerance. After tolerance develops more of the drug is needed to get the same feeling. This is followed by addiction. With addiction, drugs become the most important part of life. It becomes more important to take drugs than participate in the other usual activities of life. This includes relating to friends and family. Addiction is followed by dependency. Dependency is a condition where drugs are now needed not just to get high, but to feel normal. Addiction cannot be cured but it can be stopped. This often requires outside help and the care of professionals. Treatment centers are listed in the yellow pages under: Cocaine, Narcotics, and Alcoholics Anonymous. Most hospitals and clinics can refer you to a specialized care center. Talk to your caregiver if you need help.   This information is not intended to replace advice given to you by your health care provider. Make sure you discuss any questions you have with your health care provider.   Document Released: 02/05/2005 Document Revised: 09/08/2011 Document Reviewed: 06/21/2014 Elsevier Interactive Patient Education 2016 [employer]. [employer] Guide Inpatient Behavioral Health/Residential  Substance Abuse Treatment Adolescent  The United Ways 211 is a great source of information about community services available.  Access by dialing 2-1-1 from anywhere in [place], or by website -  PooledIncome.pl.   (Updated 07/2015)  Crisis Assistance 24 hours a day   Services Offered    Area [employer]  crisis assistance: (517)845-4680 [place], [place]  [facility]  24-hour crisis assistance:209-570-2106 [place], [place]  [place]   24-hour crisis assistance: 704-501-2466 [place], [place]   [facility] Access to Care Line  24-hour crisis assistance; (301)777-6156 All     [facility]  24-hour crisis response line: 3300456009 All   Other Local Resources (Updated 01/17)  Inpatient Behavioral Health/Residential Substance Abuse Treatment Programs   Services     Address and Phone Number  [facility]  Inpatient behavioral health treatment for children and adolescents  [phone] [address] [place], [place]  [facility]  Residential substance abuse treatment for adolescents and young adults  [phone] or [phone] [address] [place], [place]  [facility]  Residential behavioral health and/or substance abuse treatment for children and adolescents  [phone] [address] [place], [place] [address]  Community Resource Guide Outpatient Counseling/Substance Abuse Adult The United Ways 211 is a great source of information about community services available.  Access by dialing 2-1-1 from anywhere in [place], or by website -  PooledIncome.pl.   Other Local Resources (Updated 07/2015)  Crisis Hotlines   Services     Area Served  [employer]  Crisis Hotline, available 24 hours a day, 7 days a week: 239-273-8423 [facility], [place]   [facility]  Crisis Hotline, available 24 hours a day, 7 days a week: 903-461-8263 [facility], [place]  [facility]  Suicide Prevention Hotline, available 24 hours a day, 7 days a week: 747-482-2793 [facility], [place]  [employer], available 24 hours a day, 7 days a week: 636-609-4109 [facility] [place], [place]   [facility] Access to [employer], available 24 hours a day, 7 days a week: 312-110-7832 All   [facility]  Crisis Hotline, available 24 hours a day, 7 days a week: 567-145-9033 All   Other Local Resources (Updated 07/2015)  Outpatient Counseling/ Substance Abuse Programs  Services     Address and Phone  Number  ADS (Alcohol and Drug Services)   Options include Individual counseling, group counseling, intensive outpatient program (several hours a day, several days a week)  Offers depression assessments  Provides methadone maintenance program [phone] [address] [place], [place] [address]   [facility]   Offers partial hospitalization/day treatment and DUI/DWI programs  [employer], private insurance [phone] [address] [place], [place] [address]  [facility]    Services include intensive outpatient program (several hours a day, several days a week), outpatient treatment, DUI/DWI services, family education  Also has some services specifically for [employer] transitional housing  [phone] [address] [place], [place] [address]     [facility]  [employer], private pay, and private insurance [phone] [address] [place], [place] [address]  [employer] [facility]  Services include individual counseling, substance abuse intensive outpatient program (several hours a day, several days a week), day treatment  Delene Loll, Medicaid, private insurance [phone] [address] [place], [place] [address]  [facility]   Offers substance abuse intensive outpatient program (several hours a day, several days a week), partial hospitalization program [phone] [address] [place], [place] [address]  [phone] [address] [place], [place] [address]  [phone] [address] [place], [place] [address]  [phone] [id number] [place],  [place] [address]  [facility]  Individual counseling only  Accepts private insurance only [phone] [address], [address] [place], [place] [address]  [facility]  Methadone maintenance program  [phone] [address] [place], [place] [address]  [facility]  Walk-In Clinic providing substance abuse and mental health counseling  Accepts Medicaid, Medicare, private insurance  Offers sliding scale for uninsured [phone] [address] [place], [place]   [facility] [employer]  Offers individual counseling, and intensive in-home services [phone] [address], [address] [place], [place] [address]  [facility]  Offers individual counseling, family counseling, group therapy, domestic violence counseling, consumer credit counseling  Accepts Medicare, Medicaid, private insurance  Offers sliding scale for uninsured [phone] [address] [place], [place] [address]  [phone] [facility], [address] [place], [place] [address]  [facility]  Offers individual, family and group counseling  3 locations - [place], [place], and [place]  [phone]  [address] [place], [place] [address]  [address] [place], [place] [address]  [address] [place], [place] [address]  [facility]    Offers psychiatric assessment, 8-week Intensive Outpatient Program (several hours a day, several times a week, daytime or evenings), early recovery group, family Program, medication management  Private pay or private insurance only [phone], or  [phone] [address] [place], [place] [address]  [facility]  Offers individual, couples and family counseling  Accepts Medicaid, private insurance, and sliding scale for uninsured [phone] [address] [place], [place] [address]  Len Blalockavid Fuller, MD  Individual counseling  Private insurance [phone] [address] [place], [place] [address]  [facility]   Offers assessment, substance abuse treatment, and behavioral health treatment [phone] [address] [place], [place] [address]  [facility]  Individual counseling  Accepts private insurance [phone] [address] [place], [place] [address]  [facility]  Individual counseling  Delene Lollccepts Medicare, private insurance [phone] [address] [place], [place] [address]  [facility]    Offers intensive outpatient program (several hours a day, several times a week)  Private pay, private insurance [phone] [facility] [address] [facility] [place], [place]  [facility]  Individual counseling  Medicare, private insurance [phone] [address], [address] [place], [place] [address]  [facility]    Offers intensive outpatient program (several hours a day, several times a week) and partial hospitalization program [phone] [address] [place], [place] [address]  Emerson MonteParrish McKinney, MD  Individual counseling [phone] 299 3778 [address], [address] [place], [place] [address]  [facility]  Offers Christian counseling to individuals, couples, and families  Accepts Medicare and private insurance; offers sliding scale for uninsured [phone] [address] [place], [place] [address]  [facility]  [facility] [phone] [address], [address] [place], [place] [address]  [facility]   Offers crisis counseling, individual counseling, group therapy, in-home therapy, domestic violence services, day treatment, DWI services, Administrator, artsCommunity Support Team (CST), Assertive Community Treatment Team (ACTT), substance abuse Intensive Outpatient Program (several hours a day, several times a week)  2 locations - [place] and [place] [phone] [address] [place], [place] [address]  [phone] [address] [place], [place] [address]  [facility]     Individual counseling and group therapy  Accepts private insurance, [employer]are, [place]icaid [phone] [address] [place], [place]  [facility]sel[facility]  Offers individual and family counseling  Offers LGBTQ services  Accepts private insurance and private pay [phone] [address] [place], [place] [address]  [facility]    Offers individual counseling, group therapy, and outpatient detox  Accepts private insurance [phone] [address] [place], [place]  [facility]  Individual counseling  Accepts Medicare, private insurance [phone] [address], [address] [place], [place] [address]  [employer]  Individual counseling  Accepts Medicare, private insurance [phone] [address] [place], [place] [address]  [facility]   Offers substance abuse Intensive Outpatient Program (several hours a day, several times a week) [phone], or [phone] [place], [place]

## 2015-10-21 NOTE — ED Provider Notes (Signed)
CSN: [id number]     Arrival date & time 10/21/15  1503 History   First MD Initiated Contact with Patient 10/21/15 1631     Chief Complaint  Patient presents with  . Addiction Problem     (Consider location/radiation/quality/duration/timing/severity/associated sxs/prior Treatment) Patient is a 50 y.o. male presenting with mental health disorder. The history is provided by the patient.  Mental Health Problem Presenting symptoms: no suicidal thoughts, no suicidal threats and no suicide attempt   Degree of incapacity (severity):  Moderate Onset quality:  Gradual Duration:  1 day Timing:  Constant Progression:  Unchanged Chronicity:  New Context: drug abuse     Past Medical History  Diagnosis Date  . Hypertension   . Seizure disorder (HCC)   . Hx of substance abuse   . AVM (arteriovenous malformation)   . Seizures [facility])    Past Surgical History  Procedure Laterality Date  . Avm embolization  2013   Family History  Problem Relation Age of Onset  . Hypertension Father   . Coronary artery disease Father   . Alcohol abuse Father   . Diabetes Mother   . Hypertension Mother   . Coronary artery disease Brother   . Healthy Brother   . Coronary artery disease Brother   . Drug abuse Brother   . Drug abuse Brother    Social History  Substance Use Topics  . Smoking status: Current Every Day Smoker -- 0.50 packs/day    Types: Cigarettes  . Smokeless tobacco: Never Used  . Alcohol Use: 8.4 oz/week    8 Cans of beer, 6 Shots of liquor per week     Comment: Unsure of amount / pt. states he drank $150 worth ETOH in 2 days    Review of Systems  Psychiatric/Behavioral: Negative for suicidal ideas.  All other systems reviewed and are negative.     Allergies  Latex  Home Medications   Prior to Admission medications   Medication Sig Start Date End Date Taking? Authorizing Provider  albuterol (PROVENTIL HFA;VENTOLIN HFA) 108 (90 BASE) MCG/ACT inhaler Inhale 1-2 puffs into  the lungs every 6 (six) hours as needed for wheezing or shortness of breath. 08/03/14   Adonis Brook, NP  amLODipine (NORVASC) 10 MG tablet Take 1 tablet (10 mg total) by mouth daily. 09/11/14   Thermon Leyland, NP  B Complex-Biotin-FA (SUPER B-50 COMPLEX) CAPS Take 1 capsule by mouth daily. 09/11/14   Thermon Leyland, NP  Calcium-Magnesium (CALCIUM MAGNESIUM 750) 300-300 MG TABS Take 1 tablet by mouth daily. 09/11/14   Thermon Leyland, NP  diclofenac (CATAFLAM) 50 MG tablet Take 1 tablet (50 mg total) by mouth 3 (three) times daily. For back pain 09/25/15   Linna Hoff, MD  lamoTRIgine (LAMICTAL) 100 MG tablet Take 1 tablet (100 mg total) by mouth 2 (two) times daily. 05/30/15   Butch Penny, NP  levETIRAcetam (KEPPRA XR) 500 MG 24 hr tablet Take 1,000 mg by mouth daily.    Historical Provider, MD  metaxalone (SKELAXIN) 800 MG tablet Take 1 tablet (800 mg total) by mouth 3 (three) times daily. For muscle spasm 09/25/15   Linna Hoff, MD  Multiple Vitamin (MULTIVITAMIN WITH MINERALS) TABS tablet Take 1 tablet by mouth daily. 09/11/14   Thermon Leyland, NP  thiamine 100 MG tablet Take 1 tablet (100 mg total) by mouth daily. 09/11/14   Thermon Leyland, NP  tiotropium (SPIRIVA) 18 MCG inhalation capsule Place 1 capsule (18 mcg total) into  inhaler and inhale daily as needed (for shortness of breath). 08/03/14   Adonis BrookSheila Agustin, NP   BP 116/76 mmHg  Pulse 100  Temp(Src) 97.2 F (36.2 C) (Oral)  Resp 18  Ht 6\' 1"  (1.854 m)  Wt 184 lb (83.462 kg)  BMI 24.28 kg/m2  SpO2 97% Physical Exam  Constitutional: He is oriented to person, place, and time. He appears well-developed and well-nourished. No distress.  HENT:  Head: Normocephalic and atraumatic.  Eyes: Conjunctivae are normal.  Neck: Neck supple. No tracheal deviation present.  Cardiovascular: Normal rate and regular rhythm.   Pulmonary/Chest: Effort normal. No respiratory distress.  Abdominal: Soft. He exhibits no distension. There is no tenderness. There  is no rebound and no guarding.  Neurological: He is alert and oriented to person, place, and time. He has normal strength. Coordination and gait normal. GCS eye subscore is 4. GCS verbal subscore is 5. GCS motor subscore is 6.  Skin: Skin is warm and dry.  Psychiatric: He has a normal mood and affect. His speech is normal and behavior is normal. Judgment normal. Cognition and memory are normal. He expresses no homicidal and no suicidal ideation. He expresses no suicidal plans.  Vitals reviewed.   ED Course  Procedures (including critical care time) Labs Review Labs Reviewed  COMPREHENSIVE METABOLIC PANEL - Abnormal; Notable for the following:    AST 51 (*)    Total Bilirubin 1.5 (*)    All other components within normal limits  ACETAMINOPHEN LEVEL - Abnormal; Notable for the following:    Acetaminophen (Tylenol), Serum <10 (*)    All other components within normal limits  CBC - Abnormal; Notable for the following:    MCHC 36.4 (*)    All other components within normal limits  ETHANOL  SALICYLATE LEVEL  URINE RAPID DRUG SCREEN, HOSP PERFORMED    Imaging Review No results found. I have personally reviewed and evaluated these images and lab results as part of my medical decision-making.   EKG Interpretation None      MDM   Final diagnoses:  Substance abuse  Alcohol intake above recommended sensible limits [facility](HCC)   50 y.o. male presents with A binge episode of crack cocaine and alcohol use starting last night. He had previously been 8 months sober. He is upset with himself or relapsing, has been going to church to help suppress his addiction. His wife dropped him off here with the hopes of entering rehabilitation. He is not currently suicidal and denies ideation or plan. He expresses no intent to harm himself or anybody else. At this time I feel he is safe for discharge and it will be able to attend outpatient counseling or residential drug abuse program. Return precautions were  discussed for suicidality or other new concerning symptoms. He was given Tylenol for his headache.    Lyndal Pulleyaniel Adriene Knipfer, MD 10/22/15 [phone]

## 2015-10-21 NOTE — ED Notes (Signed)
He reports he relapsed into using drugs and alcohol after 8 months of sobriety last night. He used alcohol and crack/cocaine,  Last used this afternoon. He reports he thought of hurting himself today because he was angry at himself for breaking his sobriety. He denies thoughts of hurting others. He c/o "body aches" right now. He is alert and shivering, states hes been out in the cold

## 2015-10-22 ENCOUNTER — Encounter: Payer: Self-pay | Admitting: Adult Health

## 2015-10-22 ENCOUNTER — Telehealth: Payer: Self-pay | Admitting: Adult Health

## 2015-10-22 NOTE — Telephone Encounter (Signed)
I called the patient but was unable to leave messages. I will send him a letter regarding his lab work. According to Epic it appears that he was recently in the emergency room for a relapse with alcohol and cocaine.  The patient's blood work shows that he is not taking his Lamictal.

## 2015-10-26 ENCOUNTER — Encounter (HOSPITAL_COMMUNITY): Payer: Self-pay

## 2015-10-26 ENCOUNTER — Ambulatory Visit (HOSPITAL_COMMUNITY)
Admission: EM | Admit: 2015-10-26 | Discharge: 2015-10-26 | Disposition: A | Discharge status: Home / Self Care | Payer: 59 | Attending: Family Medicine | Admitting: Family Medicine

## 2015-10-26 DIAGNOSIS — Z91040 Latex allergy status: Secondary | ICD-10-CM | POA: Diagnosis not present

## 2015-10-26 DIAGNOSIS — I10 Essential (primary) hypertension: Secondary | ICD-10-CM | POA: Diagnosis not present

## 2015-10-26 DIAGNOSIS — Z8249 Family history of ischemic heart disease and other diseases of the circulatory system: Secondary | ICD-10-CM | POA: Insufficient documentation

## 2015-10-26 DIAGNOSIS — N342 Other urethritis: Secondary | ICD-10-CM | POA: Diagnosis not present

## 2015-10-26 DIAGNOSIS — Z833 Family history of diabetes mellitus: Secondary | ICD-10-CM | POA: Diagnosis not present

## 2015-10-26 DIAGNOSIS — F17210 Nicotine dependence, cigarettes, uncomplicated: Secondary | ICD-10-CM | POA: Diagnosis not present

## 2015-10-26 DIAGNOSIS — G40909 Epilepsy, unspecified, not intractable, without status epilepticus: Secondary | ICD-10-CM | POA: Diagnosis not present

## 2015-10-26 DIAGNOSIS — R1030 Lower abdominal pain, unspecified: Secondary | ICD-10-CM | POA: Diagnosis not present

## 2015-10-26 DIAGNOSIS — Z79899 Other long term (current) drug therapy: Secondary | ICD-10-CM | POA: Insufficient documentation

## 2015-10-26 LAB — POCT URINALYSIS DIP (DEVICE)
BILIRUBIN URINE: NEGATIVE
GLUCOSE, UA: NEGATIVE mg/dL
Ketones, ur: NEGATIVE mg/dL
Leukocytes, UA: NEGATIVE
NITRITE: NEGATIVE
PH: 6 (ref 5.0–8.0)
PROTEIN: NEGATIVE mg/dL
Specific Gravity, Urine: >=1.03 (ref 1.005–1.030)
Urobilinogen, UA: 1 mg/dL (ref 0.0–1.0)

## 2015-10-26 LAB — URINALYSIS, ROUTINE W REFLEX MICROSCOPIC
Bilirubin Urine: NEGATIVE
GLUCOSE, UA: NEGATIVE mg/dL
HGB URINE DIPSTICK: NEGATIVE
KETONES UR: NEGATIVE mg/dL
LEUKOCYTES UA: NEGATIVE
Nitrite: NEGATIVE
PROTEIN: NEGATIVE mg/dL
Specific Gravity, Urine: 1.023 (ref 1.005–1.030)
pH: 5.5 (ref 5.0–8.0)

## 2015-10-26 MED ORDER — CEFTRIAXONE SODIUM 250 MG IJ SOLR
250.0000 mg | Freq: Once | INTRAMUSCULAR | Status: AC
  Administered 2015-10-26: 250 mg via INTRAMUSCULAR

## 2015-10-26 MED ORDER — CEFTRIAXONE SODIUM 250 MG IJ SOLR
INTRAMUSCULAR | Status: AC
  Filled 2015-10-26: qty 250

## 2015-10-26 MED ORDER — AZITHROMYCIN 250 MG PO TABS
ORAL_TABLET | ORAL | Status: AC
Start: 2015-10-26 — End: 2015-10-26
  Filled 2015-10-26: qty 4

## 2015-10-26 MED ORDER — AZITHROMYCIN 250 MG PO TABS
1000.0000 mg | ORAL_TABLET | Freq: Once | ORAL | Status: AC
  Administered 2015-10-26: 1000 mg via ORAL

## 2015-10-26 MED ORDER — LIDOCAINE HCL (PF) 1 % IJ SOLN
INTRAMUSCULAR | Status: AC
  Filled 2015-10-26: qty 5

## 2015-10-26 NOTE — ED Notes (Addendum)
Patient presents with pain in groin x3 days, patient states after he urinates he has pressure in pelvic area, no burning, no discharge, no itching, pain in groin area, patient thinks he may need an antibiotic. No acute distress

## 2015-10-26 NOTE — ED Provider Notes (Addendum)
CSN: [id number]     Arrival date & time 10/26/15  1702 History   First MD Initiated Contact with Patient 10/26/15 1844     Chief Complaint  Patient presents with  . Groin Pain   (Consider location/radiation/quality/duration/timing/severity/associated sxs/prior Treatment) Patient is a 50 y.o. male presenting with groin pain. The history is provided by the patient. No language interpreter was used.  Groin Pain Pertinent negatives include no chest pain and no abdominal pain.  Complains of penile pain at the end of void, not during or at other times.  Had a sexual encounter with a new partner 7 days ago and the cc started 3-4 days later. No fevers or chills, no N/V/D.  Other ROS as per ROS template.   Distant history of STI many years ago.    Past Medical History  Diagnosis Date  . Hypertension   . Seizure disorder (HCC)   . Hx of substance abuse   . AVM (arteriovenous malformation)   . Seizures [facility](HCC)    Past Surgical History  Procedure Laterality Date  . Avm embolization  2013   Family History  Problem Relation Age of Onset  . Hypertension Father   . Coronary artery disease Father   . Alcohol abuse Father   . Diabetes Mother   . Hypertension Mother   . Coronary artery disease Brother   . Healthy Brother   . Coronary artery disease Brother   . Drug abuse Brother   . Drug abuse Brother    Social History  Substance Use Topics  . Smoking status: Current Every Day Smoker -- 0.50 packs/day    Types: Cigarettes  . Smokeless tobacco: Never Used  . Alcohol Use: 8.4 oz/week    8 Cans of beer, 6 Shots of liquor per week     Comment: Unsure of amount / pt. states he drank $150 worth ETOH in 2 days    Review of Systems  Constitutional: Negative for fever, chills, appetite change and fatigue.  Cardiovascular: Negative for chest pain.  Gastrointestinal: Negative for nausea, vomiting, abdominal pain, diarrhea, constipation and abdominal distention.  Endocrine: Negative for  polyuria.  Genitourinary: Positive for dysuria and penile pain. Negative for hematuria, flank pain, discharge, penile swelling, scrotal swelling, genital sores and testicular pain.    Allergies  Latex  Home Medications   Prior to Admission medications   Medication Sig Start Date End Date Taking? Authorizing Provider  amLODipine (NORVASC) 10 MG tablet Take 1 tablet (10 mg total) by mouth daily. 09/11/14  Yes Thermon LeylandLaura A Davis, NP  albuterol (PROVENTIL HFA;VENTOLIN HFA) 108 (90 BASE) MCG/ACT inhaler Inhale 1-2 puffs into the lungs every 6 (six) hours as needed for wheezing or shortness of breath. 08/03/14   Adonis BrookSheila Agustin, NP  B Complex-Biotin-FA (SUPER B-50 COMPLEX) CAPS Take 1 capsule by mouth daily. 09/11/14   Thermon LeylandLaura A Davis, NP  Calcium-Magnesium (CALCIUM MAGNESIUM 750) 300-300 MG TABS Take 1 tablet by mouth daily. 09/11/14   Thermon LeylandLaura A Davis, NP  diclofenac (CATAFLAM) 50 MG tablet Take 1 tablet (50 mg total) by mouth 3 (three) times daily. For back pain 09/25/15   Linna HoffJames D Kindl, MD  lamoTRIgine (LAMICTAL) 100 MG tablet Take 1 tablet (100 mg total) by mouth 2 (two) times daily. 05/30/15   Butch PennyMegan Millikan, NP  levETIRAcetam (KEPPRA XR) 500 MG 24 hr tablet Take 1,000 mg by mouth daily.    Historical Provider, MD  metaxalone (SKELAXIN) 800 MG tablet Take 1 tablet (800 mg total) by mouth 3 (  three) times daily. For muscle spasm 09/25/15   Linna Hoff, MD  Multiple Vitamin (MULTIVITAMIN WITH MINERALS) TABS tablet Take 1 tablet by mouth daily. 09/11/14   Thermon Leyland, NP  thiamine 100 MG tablet Take 1 tablet (100 mg total) by mouth daily. 09/11/14   Thermon Leyland, NP  tiotropium (SPIRIVA) 18 MCG inhalation capsule Place 1 capsule (18 mcg total) into inhaler and inhale daily as needed (for shortness of breath). 08/03/14   Adonis Brook, NP   Meds Ordered and Administered this Visit  Medications - No data to display  BP 122/72 mmHg  Pulse 64  Temp(Src) 97.9 F (36.6 C) (Oral)  Resp 16  SpO2 100% No data  found.   Physical Exam  Constitutional: He appears well-developed and well-nourished. No distress.  HENT:  Head: Normocephalic and atraumatic.  Neck: Normal range of motion. Neck supple.  Abdominal: Soft. He exhibits no distension and no mass. There is no tenderness. There is no rebound and no guarding.  No inguinal hernia on valsalva.   Genitourinary: Penis normal. No penile tenderness.  No penile lesions seen on inspection. No urethral meatus redness or discharge.  No inguinal adenopathy.   Skin: He is not diaphoretic.    ED Course  Procedures (including critical care time)  Labs Review Labs Reviewed  URINE CULTURE  URINALYSIS, ROUTINE W REFLEX MICROSCOPIC (NOT AT [facility])  URINE CYTOLOGY ANCILLARY ONLY    Imaging Review No results found.   Visual Acuity Review  Right Eye Distance:   Left Eye Distance:   Bilateral Distance:    Right Eye Near:   Left Eye Near:    Bilateral Near:         MDM  No diagnosis found. Patient with sxs of urethritis. UA dipstick not c/w UTI.  Sending dirty and clean-catch specimens for urine for CT/GC. Empiric treatment today with Ceftriaxone and azithromycin.  Patient encouraged to follow up with primary doctor for follow up.      Barbaraann Barthel, MD 10/26/15 1846  Barbaraann Barthel, MD 10/26/15 [phone]

## 2015-10-26 NOTE — Discharge Instructions (Signed)
It is a pleasure to see you today in urgent care.   We are sending labs and will be in contact with you in a couple of days with the results.   You are being treated with CEFTRIAXONE 250mg  injection, and AZITHROMYCIN 1gram by mouth in the urgent care.   No sexual intercourse until after all testing results are back.   Follow up with your doctor (Dr. Abigail Miyamotohacker) in approximately 1 month, or sooner, if not improving or if worsening.

## 2015-10-28 LAB — URINE CULTURE
CULTURE: NO GROWTH
SPECIAL REQUESTS: NORMAL

## 2015-10-29 LAB — URINE CYTOLOGY ANCILLARY ONLY
CHLAMYDIA, DNA PROBE: NEGATIVE
NEISSERIA GONORRHEA: NEGATIVE

## 2015-11-01 ENCOUNTER — Telehealth (HOSPITAL_COMMUNITY): Payer: Self-pay

## 2015-11-01 NOTE — Telephone Encounter (Signed)
Called to schedule appt with Dr. Corliss Skainseveshwar, no answer, will try back later. AW

## 2016-02-28 ENCOUNTER — Telehealth (HOSPITAL_COMMUNITY): Payer: Self-pay

## 2016-02-28 NOTE — Telephone Encounter (Signed)
Called to schedule consult, no answer, no vm. AW  

## 2016-04-17 ENCOUNTER — Ambulatory Visit: Payer: 59 | Admitting: Neurology

## 2016-06-27 ENCOUNTER — Ambulatory Visit
Admission: RE | Admit: 2016-06-27 | Discharge: 2016-06-27 | Disposition: A | Discharge status: Home / Self Care | Payer: 59 | Source: Ambulatory Visit | Attending: Family Medicine | Admitting: Family Medicine

## 2016-06-27 ENCOUNTER — Other Ambulatory Visit: Payer: Self-pay | Admitting: Family Medicine

## 2016-06-27 DIAGNOSIS — M545 Low back pain: Secondary | ICD-10-CM

## 2016-08-07 ENCOUNTER — Telehealth: Payer: Self-pay | Admitting: Neurology

## 2016-08-07 NOTE — Telephone Encounter (Signed)
Dr Willis- please advise 

## 2016-08-07 NOTE — Telephone Encounter (Signed)
Patient's wife is calling to get a referral to a neuro psychologist for the patient. I advised Dr. Anne HahnWillis is out of the office until next Monday and she said a call next week would be alright.

## 2016-08-09 NOTE — Telephone Encounter (Signed)
   I called and left a message, not sure exactly the reason for the referral would be, the patient or the wife is to contact our office next week regarding this.

## 2016-08-12 NOTE — Telephone Encounter (Signed)
Patients wife called office in reference to a referral for neuropsychologist.  Wife states patient has been making impulsive decisions, social behavior has gone back to 20 years ago. Patents wife states his behavior is getting worse.  Please call

## 2016-08-12 NOTE — Telephone Encounter (Signed)
Called and spoke to wife. R/s appt made on 08/18/16 to 08/13/16 instead. Dr Anne HahnWillis had a cx. She verbalized understanding and appreciation for call.

## 2016-08-12 NOTE — Telephone Encounter (Signed)
Called and spoke to wife. Scheduled f/u for 08/18/16 for f/u at 1030am, check in 1000am. She would like to be called if there are any cx tomorrow. Advised I will call them first if there are any. She verbalized understanding. She states husband does not like taking work off and has work Engineer, maintenance (IT)off tomorrow already.

## 2016-08-12 NOTE — Telephone Encounter (Signed)
Dr Willis- please advise 

## 2016-08-12 NOTE — Telephone Encounter (Signed)
I called the wife. The patient continues to abuse alcohol and cocaine. Patient does not take his seizure medications properly, he has been irritable. He is on Keppra and Lamictal for seizures. Lamictal may cause irritability, this medication may need to be changed.  Before I order neuropsychological evaluation, the patient will need to agree to having the testing done. The patient does not otherwise participate in counseling sessions for substance abuse, he will not see a psychiatrist, he is becoming more antisocial.  I will try to get a revisit sometime in the next several weeks, if the patient will agree to neuropsychological testing I will try to get this done.

## 2016-08-13 ENCOUNTER — Encounter: Payer: Self-pay | Admitting: Neurology

## 2016-08-13 ENCOUNTER — Ambulatory Visit (INDEPENDENT_AMBULATORY_CARE_PROVIDER_SITE_OTHER): Payer: Federal, State, Local not specified - PPO | Admitting: Neurology

## 2016-08-13 VITALS — BP 108/60 | HR 65 | Ht 73.0 in | Wt 178.5 lb

## 2016-08-13 DIAGNOSIS — R413 Other amnesia: Secondary | ICD-10-CM | POA: Diagnosis not present

## 2016-08-13 DIAGNOSIS — F39 Unspecified mood [affective] disorder: Secondary | ICD-10-CM

## 2016-08-13 DIAGNOSIS — R569 Unspecified convulsions: Secondary | ICD-10-CM

## 2016-08-13 MED ORDER — LAMOTRIGINE 100 MG PO TABS: 100.0000 mg | ORAL_TABLET | Freq: Two times a day (BID) | ORAL | 1 refills | Status: DC

## 2016-08-13 NOTE — Progress Notes (Signed)
Reason for visit: Seizures  Samuel SingDavid Atkinson is an 51 y.o. male  History of present illness:  Samuel Atkinson is a 51 year old right-handed black male with a history of seizures. The patient has a history of a frontal AVM status post coiling procedure. The patient has not had a seizure in over the last year that he knows of. He has had ongoing issues with substance abuse with alcohol and cocaine. He has not been taking his Keppra or his Lamictal. He has had increasing problems with irritability according to his wife. The patient has also developed a memory issue. He denies any numbness or weakness of the face, arms, or legs. He denies any balance issues or falls. He does work loading trucks. He does not operate a motor vehicle. The patient comes to the office on an urgent request of the wife.   Past Medical History:  Diagnosis Date  . AVM (arteriovenous malformation)   . Hx of substance abuse   . Hypertension   . Seizure disorder (HCC)   . Seizures (HCC)     Past Surgical History:  Procedure Laterality Date  . AVM Embolization  2013    Family History  Problem Relation Age of Onset  . Hypertension Father   . Coronary artery disease Father   . Alcohol abuse Father   . Diabetes Mother   . Hypertension Mother   . Healthy Brother   . Coronary artery disease Brother   . Drug abuse Brother   . Drug abuse Brother   . Coronary artery disease Brother     Social history:  reports that he has been smoking Cigarettes.  He has been smoking about 0.50 packs per day. He has never used smokeless tobacco. He reports that he drinks about 8.4 oz of alcohol per week . He reports that he uses drugs, including Cocaine, about 6 times per week.    Allergies  Allergen Reactions  . Latex Rash    Medications:  Prior to Admission medications   Medication Sig Start Date End Date Taking? Authorizing Provider  albuterol (PROVENTIL HFA;VENTOLIN HFA) 108 (90 BASE) MCG/ACT inhaler Inhale 1-2 puffs into  the lungs every 6 (six) hours as needed for wheezing or shortness of breath. 08/03/14  Yes Adonis BrookSheila Agustin, NP  amLODipine (NORVASC) 10 MG tablet Take 1 tablet (10 mg total) by mouth daily. 09/11/14  Yes Thermon LeylandLaura A Davis, NP  B Complex-Biotin-FA (SUPER B-50 COMPLEX) CAPS Take 1 capsule by mouth daily. 09/11/14  Yes Thermon LeylandLaura A Davis, NP  Calcium-Magnesium (CALCIUM MAGNESIUM 750) 300-300 MG TABS Take 1 tablet by mouth daily. 09/11/14  Yes Thermon LeylandLaura A Davis, NP  diclofenac (CATAFLAM) 50 MG tablet Take 1 tablet (50 mg total) by mouth 3 (three) times daily. For back pain 09/25/15  Yes Linna HoffJames D Kindl, MD  lamoTRIgine (LAMICTAL) 100 MG tablet Take 1 tablet (100 mg total) by mouth 2 (two) times daily. 08/13/16  Yes York Spanielharles K Basha Krygier, MD  metaxalone (SKELAXIN) 800 MG tablet Take 1 tablet (800 mg total) by mouth 3 (three) times daily. For muscle spasm 09/25/15  Yes Linna HoffJames D Kindl, MD  Multiple Vitamin (MULTIVITAMIN WITH MINERALS) TABS tablet Take 1 tablet by mouth daily. 09/11/14  Yes Thermon LeylandLaura A Davis, NP  thiamine 100 MG tablet Take 1 tablet (100 mg total) by mouth daily. 09/11/14  Yes Thermon LeylandLaura A Davis, NP  tiotropium (SPIRIVA) 18 MCG inhalation capsule Place 1 capsule (18 mcg total) into inhaler and inhale daily as needed (for shortness of breath).  08/03/14  Yes Adonis Brook, NP    ROS:  Out of a complete 14 system review of symptoms, the patient complains only of the following symptoms, and all other reviewed systems are negative.  Moodiness, irritability Memory disorder  Blood pressure 108/60, pulse 65, height 6\' 1"  (1.854 m), weight 178 lb 8 oz (81 kg).  Physical Exam  General: The patient is alert and cooperative at the time of the examination.  Skin: No significant peripheral edema is noted.   Neurologic Exam  Mental status: The patient is alert and oriented x 3 at the time of the examination. The patient has apparent normal recent and remote memory, with an apparently normal attention span and concentration  ability.   Cranial nerves: Facial symmetry is present. Speech is normal, no aphasia or dysarthria is noted. Extraocular movements are full. Visual fields are full.  Motor: The patient has good strength in all 4 extremities.  Sensory examination: Soft touch sensation is symmetric on the face, arms, and legs.  Coordination: The patient has good finger-nose-finger and heel-to-shin bilaterally.  Gait and station: The patient has a normal gait. Tandem gait is normal. Romberg is negative. No drift is seen.  Reflexes: Deep tendon reflexes are symmetric.   Assessment/Plan:  1. History of seizures  2. Substance abuse, cocaine and alcohol  3. Mood disorder  4. Medical noncompliance  The patient will be set up for neuropsychological testing, the wife believes that he has developed some issues with memory. The patient will have a referral to psychiatry for his mood disorder. He will be taken off of Keppra given the history of irritability, as Keppra can exacerbate this. He will get back on the Lamictal. The patient desperately needs to stop drinking and using cocaine. He will follow-up in about 6 months.  Marlan Palau MD 08/13/2016 3:21 PM  [facility] [address] [place], [place] [address]  Phone [phone] Fax [phone]

## 2016-08-13 NOTE — Patient Instructions (Signed)
   We will get a referral to psychiatry and get neuropsychological testing.

## 2016-08-18 ENCOUNTER — Ambulatory Visit: Payer: 59 | Admitting: Neurology

## 2016-09-02 ENCOUNTER — Ambulatory Visit (HOSPITAL_COMMUNITY)
Admission: RE | Admit: 2016-09-02 | Discharge: 2016-09-02 | Disposition: A | Discharge status: Home / Self Care | Payer: Federal, State, Local not specified - PPO | Source: Home / Self Care | Attending: Psychiatry | Admitting: Psychiatry

## 2016-09-02 ENCOUNTER — Inpatient Hospital Stay (HOSPITAL_COMMUNITY)
Admission: EM | Admit: 2016-09-02 | Discharge: 2016-09-06 | DRG: 101 | Disposition: A | Discharge status: Home / Self Care | Payer: Federal, State, Local not specified - PPO | Attending: Internal Medicine | Admitting: Internal Medicine

## 2016-09-02 ENCOUNTER — Encounter (HOSPITAL_COMMUNITY): Payer: Self-pay

## 2016-09-02 DIAGNOSIS — Z79899 Other long term (current) drug therapy: Secondary | ICD-10-CM

## 2016-09-02 DIAGNOSIS — I639 Cerebral infarction, unspecified: Secondary | ICD-10-CM

## 2016-09-02 DIAGNOSIS — Y902 Blood alcohol level of 40-59 mg/100 ml: Secondary | ICD-10-CM | POA: Diagnosis present

## 2016-09-02 DIAGNOSIS — F17210 Nicotine dependence, cigarettes, uncomplicated: Secondary | ICD-10-CM

## 2016-09-02 DIAGNOSIS — R253 Fasciculation: Secondary | ICD-10-CM | POA: Diagnosis present

## 2016-09-02 DIAGNOSIS — F1994 Other psychoactive substance use, unspecified with psychoactive substance-induced mood disorder: Secondary | ICD-10-CM | POA: Diagnosis not present

## 2016-09-02 DIAGNOSIS — Z811 Family history of alcohol abuse and dependence: Secondary | ICD-10-CM

## 2016-09-02 DIAGNOSIS — F10939 Alcohol use, unspecified with withdrawal, unspecified: Secondary | ICD-10-CM | POA: Diagnosis present

## 2016-09-02 DIAGNOSIS — Q282 Arteriovenous malformation of cerebral vessels: Secondary | ICD-10-CM | POA: Diagnosis not present

## 2016-09-02 DIAGNOSIS — R569 Unspecified convulsions: Secondary | ICD-10-CM

## 2016-09-02 DIAGNOSIS — G40909 Epilepsy, unspecified, not intractable, without status epilepticus: Secondary | ICD-10-CM

## 2016-09-02 DIAGNOSIS — F1010 Alcohol abuse, uncomplicated: Secondary | ICD-10-CM | POA: Diagnosis present

## 2016-09-02 DIAGNOSIS — E860 Dehydration: Secondary | ICD-10-CM | POA: Diagnosis present

## 2016-09-02 DIAGNOSIS — F1420 Cocaine dependence, uncomplicated: Secondary | ICD-10-CM

## 2016-09-02 DIAGNOSIS — Z833 Family history of diabetes mellitus: Secondary | ICD-10-CM | POA: Insufficient documentation

## 2016-09-02 DIAGNOSIS — Z8249 Family history of ischemic heart disease and other diseases of the circulatory system: Secondary | ICD-10-CM | POA: Insufficient documentation

## 2016-09-02 DIAGNOSIS — F10930 Alcohol use, unspecified with withdrawal, uncomplicated: Secondary | ICD-10-CM

## 2016-09-02 DIAGNOSIS — I10 Essential (primary) hypertension: Secondary | ICD-10-CM | POA: Diagnosis not present

## 2016-09-02 DIAGNOSIS — Z91040 Latex allergy status: Secondary | ICD-10-CM

## 2016-09-02 DIAGNOSIS — Z9114 Patient's other noncompliance with medication regimen: Secondary | ICD-10-CM | POA: Diagnosis not present

## 2016-09-02 DIAGNOSIS — R45851 Suicidal ideations: Secondary | ICD-10-CM | POA: Diagnosis present

## 2016-09-02 DIAGNOSIS — E876 Hypokalemia: Secondary | ICD-10-CM | POA: Diagnosis present

## 2016-09-02 DIAGNOSIS — F10239 Alcohol dependence with withdrawal, unspecified: Secondary | ICD-10-CM | POA: Diagnosis present

## 2016-09-02 DIAGNOSIS — F1414 Cocaine abuse with cocaine-induced mood disorder: Secondary | ICD-10-CM | POA: Diagnosis present

## 2016-09-02 DIAGNOSIS — F329 Major depressive disorder, single episode, unspecified: Secondary | ICD-10-CM | POA: Diagnosis present

## 2016-09-02 DIAGNOSIS — R441 Visual hallucinations: Secondary | ICD-10-CM | POA: Diagnosis not present

## 2016-09-02 DIAGNOSIS — Z813 Family history of other psychoactive substance abuse and dependence: Secondary | ICD-10-CM

## 2016-09-02 DIAGNOSIS — F10230 Alcohol dependence with withdrawal, uncomplicated: Secondary | ICD-10-CM | POA: Diagnosis not present

## 2016-09-02 DIAGNOSIS — F1410 Cocaine abuse, uncomplicated: Secondary | ICD-10-CM | POA: Diagnosis present

## 2016-09-02 DIAGNOSIS — F1910 Other psychoactive substance abuse, uncomplicated: Secondary | ICD-10-CM

## 2016-09-02 DIAGNOSIS — F339 Major depressive disorder, recurrent, unspecified: Secondary | ICD-10-CM | POA: Diagnosis not present

## 2016-09-02 DIAGNOSIS — F1020 Alcohol dependence, uncomplicated: Secondary | ICD-10-CM | POA: Insufficient documentation

## 2016-09-02 LAB — COMPREHENSIVE METABOLIC PANEL
ALBUMIN: 3.9 g/dL (ref 3.5–5.0)
ALT: 31 U/L (ref 17–63)
ANION GAP: 11 (ref 5–15)
AST: 46 U/L — ABNORMAL HIGH (ref 15–41)
Alkaline Phosphatase: 54 U/L (ref 38–126)
BUN: 16 mg/dL (ref 6–20)
CHLORIDE: 104 mmol/L (ref 101–111)
CO2: 22 mmol/L (ref 22–32)
Calcium: 9.4 mg/dL (ref 8.9–10.3)
Creatinine, Ser: 1.06 mg/dL (ref 0.61–1.24)
GFR calc non Af Amer: >60 mL/min (ref >60)
Glucose, Bld: 125 mg/dL — ABNORMAL HIGH (ref 65–99)
Potassium: 3.3 mmol/L — ABNORMAL LOW (ref 3.5–5.1)
SODIUM: 137 mmol/L (ref 135–145)
Total Bilirubin: 0.9 mg/dL (ref 0.3–1.2)
Total Protein: 6.7 g/dL (ref 6.5–8.1)

## 2016-09-02 LAB — CBC WITH DIFFERENTIAL/PLATELET
Basophils Absolute: 0 10*3/uL (ref 0.0–0.1)
Basophils Relative: 0 %
Eosinophils Absolute: 0 10*3/uL (ref 0.0–0.7)
Eosinophils Relative: 0 %
HEMATOCRIT: 46.4 % (ref 39.0–52.0)
Hemoglobin: 17 g/dL (ref 13.0–17.0)
LYMPHS PCT: 31 %
Lymphs Abs: 2 10*3/uL (ref 0.7–4.0)
MCH: 33.1 pg (ref 26.0–34.0)
MCHC: 36.6 g/dL — ABNORMAL HIGH (ref 30.0–36.0)
MCV: 90.4 fL (ref 78.0–100.0)
Monocytes Absolute: 0.8 10*3/uL (ref 0.1–1.0)
Monocytes Relative: 12 %
NEUTROS PCT: 56 %
Neutro Abs: 3.6 10*3/uL (ref 1.7–7.7)
Platelets: 188 10*3/uL (ref 150–400)
RBC: 5.13 MIL/uL (ref 4.22–5.81)
RDW: 12.4 % (ref 11.5–15.5)
WBC: 6.3 10*3/uL (ref 4.0–10.5)

## 2016-09-02 LAB — CBC
HEMATOCRIT: 43.9 % (ref 39.0–52.0)
Hemoglobin: 16.2 g/dL (ref 13.0–17.0)
MCH: 32.8 pg (ref 26.0–34.0)
MCHC: 36.9 g/dL — ABNORMAL HIGH (ref 30.0–36.0)
MCV: 88.9 fL (ref 78.0–100.0)
PLATELETS: 196 10*3/uL (ref 150–400)
RBC: 4.94 MIL/uL (ref 4.22–5.81)
RDW: 12.3 % (ref 11.5–15.5)
WBC: 5.7 10*3/uL (ref 4.0–10.5)

## 2016-09-02 LAB — CREATININE, SERUM: Creatinine, Ser: 0.89 mg/dL (ref 0.61–1.24)

## 2016-09-02 LAB — ETHANOL: Alcohol, Ethyl (B): 45 mg/dL — ABNORMAL HIGH (ref <5)

## 2016-09-02 LAB — MAGNESIUM: MAGNESIUM: 2 mg/dL (ref 1.7–2.4)

## 2016-09-02 MED ORDER — LORAZEPAM 2 MG/ML IJ SOLN
0.0000 mg | Freq: Two times a day (BID) | INTRAMUSCULAR | Status: AC
  Administered 2016-09-05 (×2): 1 mg via INTRAVENOUS
  Administered 2016-09-05 (×2): 2 mg via INTRAVENOUS
  Filled 2016-09-02 (×3): qty 1

## 2016-09-02 MED ORDER — ACETAMINOPHEN 325 MG PO TABS
650.0000 mg | ORAL_TABLET | Freq: Four times a day (QID) | ORAL | Status: DC | PRN
  Administered 2016-09-03 – 2016-09-06 (×5): 650 mg via ORAL
  Filled 2016-09-02 (×5): qty 2

## 2016-09-02 MED ORDER — ACETAMINOPHEN 650 MG RE SUPP: 650.0000 mg | Freq: Four times a day (QID) | RECTAL | Status: DC | PRN

## 2016-09-02 MED ORDER — THIAMINE HCL 100 MG/ML IJ SOLN: 100.0000 mg | Freq: Every day | INTRAMUSCULAR | Status: DC

## 2016-09-02 MED ORDER — LORAZEPAM 2 MG/ML IJ SOLN
0.0000 mg | Freq: Two times a day (BID) | INTRAMUSCULAR | Status: DC
Start: 2016-09-04 — End: 2016-09-02

## 2016-09-02 MED ORDER — POTASSIUM CHLORIDE CRYS ER 20 MEQ PO TBCR
40.0000 meq | EXTENDED_RELEASE_TABLET | ORAL | Status: AC
  Administered 2016-09-02 (×2): 40 meq via ORAL
  Filled 2016-09-02 (×2): qty 2

## 2016-09-02 MED ORDER — LORAZEPAM 2 MG/ML IJ SOLN: 1.0000 mg | Freq: Four times a day (QID) | INTRAMUSCULAR | Status: DC | PRN

## 2016-09-02 MED ORDER — FOLIC ACID 1 MG PO TABS
1.0000 mg | ORAL_TABLET | Freq: Every day | ORAL | Status: DC
  Administered 2016-09-02 – 2016-09-06 (×5): 1 mg via ORAL
  Filled 2016-09-02 (×5): qty 1

## 2016-09-02 MED ORDER — FOLIC ACID 1 MG PO TABS: 1.0000 mg | ORAL_TABLET | Freq: Every day | ORAL | Status: DC

## 2016-09-02 MED ORDER — LORAZEPAM 1 MG PO TABS
1.0000 mg | ORAL_TABLET | Freq: Four times a day (QID) | ORAL | Status: DC | PRN
  Administered 2016-09-02: 1 mg via ORAL
  Filled 2016-09-02: qty 1

## 2016-09-02 MED ORDER — LORAZEPAM 2 MG/ML IJ SOLN
0.0000 mg | Freq: Four times a day (QID) | INTRAMUSCULAR | Status: DC
Start: 2016-09-02 — End: 2016-09-02

## 2016-09-02 MED ORDER — VITAMIN B-1 100 MG PO TABS: 100.0000 mg | ORAL_TABLET | Freq: Every day | ORAL | Status: DC

## 2016-09-02 MED ORDER — LORAZEPAM 1 MG PO TABS
1.0000 mg | ORAL_TABLET | Freq: Four times a day (QID) | ORAL | Status: AC | PRN
  Administered 2016-09-04: 1 mg via ORAL
  Filled 2016-09-02: qty 1

## 2016-09-02 MED ORDER — LORAZEPAM 2 MG/ML IJ SOLN
0.0000 mg | Freq: Four times a day (QID) | INTRAMUSCULAR | Status: AC
  Administered 2016-09-02 – 2016-09-03 (×3): 1 mg via INTRAVENOUS
  Administered 2016-09-03 – 2016-09-04 (×3): 2 mg via INTRAVENOUS
  Filled 2016-09-02 (×7): qty 1

## 2016-09-02 MED ORDER — VITAMIN B-1 100 MG PO TABS
100.0000 mg | ORAL_TABLET | Freq: Every day | ORAL | Status: DC
  Administered 2016-09-02 – 2016-09-06 (×5): 100 mg via ORAL
  Filled 2016-09-02 (×5): qty 1

## 2016-09-02 MED ORDER — ENOXAPARIN SODIUM 40 MG/0.4ML ~~LOC~~ SOLN
40.0000 mg | SUBCUTANEOUS | Status: DC
  Administered 2016-09-03 – 2016-09-05 (×3): 40 mg via SUBCUTANEOUS
  Filled 2016-09-02 (×3): qty 0.4

## 2016-09-02 MED ORDER — LAMOTRIGINE 100 MG PO TABS
100.0000 mg | ORAL_TABLET | Freq: Two times a day (BID) | ORAL | Status: DC
  Administered 2016-09-02 – 2016-09-06 (×8): 100 mg via ORAL
  Filled 2016-09-02 (×8): qty 1

## 2016-09-02 MED ORDER — ONDANSETRON HCL 4 MG PO TABS: 4.0000 mg | ORAL_TABLET | Freq: Four times a day (QID) | ORAL | Status: DC | PRN

## 2016-09-02 MED ORDER — LORAZEPAM 2 MG/ML IJ SOLN
1.0000 mg | Freq: Four times a day (QID) | INTRAMUSCULAR | Status: AC | PRN
  Administered 2016-09-05: 1 mg via INTRAVENOUS
  Filled 2016-09-02 (×2): qty 1

## 2016-09-02 MED ORDER — ADULT MULTIVITAMIN W/MINERALS CH
1.0000 | ORAL_TABLET | Freq: Every day | ORAL | Status: DC
  Administered 2016-09-02 – 2016-09-06 (×5): 1 via ORAL
  Filled 2016-09-02 (×5): qty 1

## 2016-09-02 MED ORDER — ONDANSETRON HCL 4 MG/2ML IJ SOLN
4.0000 mg | Freq: Four times a day (QID) | INTRAMUSCULAR | Status: DC | PRN
  Administered 2016-09-06: 4 mg via INTRAVENOUS
  Filled 2016-09-02: qty 2

## 2016-09-02 MED ORDER — ADULT MULTIVITAMIN W/MINERALS CH: 1.0000 | ORAL_TABLET | Freq: Every day | ORAL | Status: DC

## 2016-09-02 NOTE — ED Notes (Signed)
Bed: [id number] Expected date:  Expected time:  Means of arrival:  Comments: Margo AyeHall C being examined by Hospitalist

## 2016-09-02 NOTE — BH Assessment (Signed)
Assessment Note  Samuel Atkinson is a 51 y.o. male who presents to [facility] as a walk in, accompanied by his wife, for detox. Pt does not appear well and unable to hold his head up-leaning it up against the wall. He denies SI, HI, AVH. Pt is scheduled to go to [facility] for long term treatment in 2 days, but needs to be detoxed. He has a hx of seizures when withdrawing.    Diagnosis: Polysubstance use d/o, severe (alcohol & cocaine)  Past Medical History:  Past Medical History:  Diagnosis Date  . AVM (arteriovenous malformation)   . Hx of substance abuse   . Hypertension   . Seizure disorder (HCC)   . Seizures (HCC)     Past Surgical History:  Procedure Laterality Date  . AVM Embolization  2013    Family History:  Family History  Problem Relation Age of Onset  . Hypertension Father   . Coronary artery disease Father   . Alcohol abuse Father   . Diabetes Mother   . Hypertension Mother   . Healthy Brother   . Coronary artery disease Brother   . Drug abuse Brother   . Drug abuse Brother   . Coronary artery disease Brother     Social History:  reports that he has been smoking Cigarettes.  He has been smoking about 0.50 packs per day. He has never used smokeless tobacco. He reports that he drinks about 8.4 oz of alcohol per week . He reports that he uses drugs, including Cocaine, about 6 times per week.  Additional Social History:  Alcohol / Drug Use Pain Medications: none noted Prescriptions: none noted Over the Counter: none noted History of alcohol / drug use?: Yes Longest period of sobriety (when/how long): unknown Substance #1 Name of Substance 1: Alcohol 1 - Frequency: daily 1 - Duration: ongoing 1 - Last Use / Amount: today at 1145am  CIWA:   COWS:    Allergies:  Allergies  Allergen Reactions  . Latex Rash    Home Medications:  (Not in a hospital admission)  OB/GYN Status:  No LMP for male patient.  General Assessment Data Location of Assessment: [facility]  Assessment Services TTS Assessment: In system Is this a Tele or Face-to-Face Assessment?: Face-to-Face Is this an Initial Assessment or a Re-assessment for this encounter?: Initial Assessment Marital status: Married Living Arrangements: Spouse/significant other Can pt return to current living arrangement?: Yes Admission Status: Voluntary Is patient capable of signing voluntary admission?: Yes Referral Source: Self/Family/Friend Insurance type: Scientist, research (physical sciences) Exam [facility] Walk-in ONLY) Medical Exam completed: Yes  Crisis Care Plan Living Arrangements: Spouse/significant other Name of Psychiatrist: NONE Name of Therapist: NONE  Education Status Is patient currently in school?: No  Risk to self with the past 6 months Suicidal Ideation: No Has patient been a risk to self within the past 6 months prior to admission? : No Suicidal Intent: No Has patient had any suicidal intent within the past 6 months prior to admission? : No Is patient at risk for suicide?: No Suicidal Plan?: No Has patient had any suicidal plan within the past 6 months prior to admission? : No Access to Means: No What has been your use of drugs/alcohol within the last 12 months?: see above Previous Attempts/Gestures: No Intentional Self Injurious Behavior: None Family Suicide History: Unknown Persecutory voices/beliefs?: No Substance abuse history and/or treatment for substance abuse?: Yes Suicide prevention information given to non-admitted patients: Not applicable  Risk to Others within the  past 6 months Homicidal Ideation: No Does patient have any lifetime risk of violence toward others beyond the six months prior to admission? : No Thoughts of Harm to Others: No Current Homicidal Intent: No Current Homicidal Plan: No Access to Homicidal Means: No History of harm to others?: No Assessment of Violence: None Noted Does patient have access to weapons?: No Criminal Charges Pending?: No Does patient  have a court date: No Is patient on probation?: Unknown  Psychosis Hallucinations: None noted Delusions: None noted  Mental Status Report Appearance/Hygiene: Unremarkable Eye Contact: Poor Motor Activity: Unremarkable Speech: Incoherent Level of Consciousness: Sleeping, Quiet/awake Mood: Depressed Affect: Appropriate to circumstance Anxiety Level: Minimal Thought Processes: Unable to Assess Judgement: Partial Orientation: Unable to assess Obsessive Compulsive Thoughts/Behaviors: None  Cognitive Functioning Concentration: Poor Memory: Unable to Assess IQ: Average Insight: Unable to Assess Impulse Control: Fair Appetite: Fair Sleep: No Change Vegetative Symptoms: None  ADLScreening [facility] Assessment Services) Patient's cognitive ability adequate to safely complete daily activities?: Yes Patient able to express need for assistance with ADLs?: Yes Independently performs ADLs?: Yes (appropriate for developmental age)  Prior Inpatient Therapy Prior Inpatient Therapy: Yes Prior Therapy Dates: multiple dates [facility] Reason for Treatment: polysubstance abuse  Prior Outpatient Therapy Prior Outpatient Therapy: No Does patient have an ACCT team?: No Does patient have Intensive In-House Services?  : No Does patient have [facility] services? : No Does patient have P4CC services?: No  ADL Screening (condition at time of admission) Patient's cognitive ability adequate to safely complete daily activities?: Yes Is the patient deaf or have difficulty hearing?: No Does the patient have difficulty seeing, even when wearing glasses/contacts?: No Does the patient have difficulty concentrating, remembering, or making decisions?: No Patient able to express need for assistance with ADLs?: Yes Does the patient have difficulty dressing or bathing?: No Independently performs ADLs?: Yes (appropriate for developmental age) Does the patient have difficulty  walking or climbing stairs?: No Weakness of Legs: None Weakness of Arms/Hands: None  Home Assistive Devices/Equipment Home Assistive Devices/Equipment: None  Therapy Consults (therapy consults require a physician order) PT Evaluation Needed: No OT Evalulation Needed: No SLP Evaluation Needed: No Abuse/Neglect Assessment (Assessment to be complete while patient is alone) Physical Abuse: Denies Verbal Abuse: Denies Sexual Abuse: Denies Exploitation of patient/patient's resources: Denies Self-Neglect: Denies Values / Beliefs Cultural Requests During Hospitalization: None Spiritual Requests During Hospitalization: None Consults Spiritual Care Consult Needed: No Social Work Consult Needed: No Merchant navy officerAdvance Directives (For Healthcare) Does Patient Have a Medical Advance Directive?: No Would patient like information on creating a medical advance directive?: No - Patient declined    Additional Information 1:1 In Past 12 Months?: No CIRT Risk: No Elopement Risk: No Does patient have medical clearance?: No     Disposition:  Disposition Initial Assessment Completed for this Encounter: Yes (consulted with Serena ColonelAggie Nwoko, PMHNP) Disposition of Patient: Other dispositions (Pt given referrals for substance abuse treatment) Other disposition(s): Other (Comment) ([facility]  On Site Evaluation by:   Reviewed with Physician:    Laddie AquasSamantha M Rooney Gladwin 09/02/2016 2:04 PM

## 2016-09-02 NOTE — ED Triage Notes (Signed)
Pt requesting alcohol and crack detox. [facility] Pt has hx of seizure with withdrawal so his wife feels that he needs to be watched. Pt has AVM in R frontal lobe. Pt last drink today at 1130, last smoked crack yesterday. A&Ox4. Ambulatory.

## 2016-09-02 NOTE — ED Provider Notes (Signed)
[facility]-EMERGENCY DEPT Provider Note   CSN: [id number] Arrival date & time: 09/02/16  1331     History   Chief Complaint Chief Complaint  Patient presents with  . Alcohol Problem    HPI Samuel Atkinson is a 51 y.o. male.  HPI   Pt with hx polysubstance abuse including ETOH (1/5 liquor daily), crack cocaine use, seizure disorder, hx AVM malformation s/p coiling procedure.  Pt was sent from behavioral health for detox due to hx withdrawal seizures.  Pt was switched from keppra to lamictal last month but did not fill the prescription and continues taking keppra.  Wife reports patient often complains about back pain then drinks alcohol then leaves the house for days at a time, smokes crack cocaine and wanders around the city.  She does not know all that he does.    Last drug use was overnight.  Last alcohol was late this morning.   Pt notes he is twitching in his face and that this is what he does when he is seizing or about to seize.    Pt denies any falls or injury, states he has not slept for several days.    Level V caveat for intoxication vs excessive sleepiness.    Past Medical History:  Diagnosis Date  . AVM (arteriovenous malformation)   . Hx of substance abuse   . Hypertension   . Seizure disorder (HCC)   . Seizures [facility])     Patient Active Problem List   Diagnosis Date Noted  . Major depressive disorder 09/10/2014  . Cocaine abuse 09/10/2014  . Alcohol dependence (HCC) 09/10/2014  . Alcohol abuse 09/09/2014  . Substance induced mood disorder (HCC) 09/09/2014  . Cocaine use disorder, severe, dependence (HCC) 09/07/2014  . Suicidal ideation   . Seizures (HCC) 02/09/2013  . Cerebral AVM 02/09/2013    Past Surgical History:  Procedure Laterality Date  . AVM Embolization  2013       Home Medications    Prior to Admission medications   Medication Sig Start Date End Date Taking? Authorizing Provider  amLODipine (NORVASC) 10 MG tablet Take 1 tablet (10 mg  total) by mouth daily. 09/11/14  Yes Thermon Leyland, NP  baclofen (LIORESAL) 10 MG tablet TK 1 T PO  WITH FOOD OR MILK TID. 07/07/16  Yes Historical Provider, MD  albuterol (PROVENTIL HFA;VENTOLIN HFA) 108 (90 BASE) MCG/ACT inhaler Inhale 1-2 puffs into the lungs every 6 (six) hours as needed for wheezing or shortness of breath. 08/03/14   Adonis Brook, NP  B Complex-Biotin-FA (SUPER B-50 COMPLEX) CAPS Take 1 capsule by mouth daily. Patient not taking: Reported on 09/02/2016 09/11/14   Thermon Leyland, NP  Calcium-Magnesium (CALCIUM MAGNESIUM 750) 300-300 MG TABS Take 1 tablet by mouth daily. Patient not taking: Reported on 09/02/2016 09/11/14   Thermon Leyland, NP  diclofenac (CATAFLAM) 50 MG tablet Take 1 tablet (50 mg total) by mouth 3 (three) times daily. For back pain Patient not taking: Reported on 09/02/2016 09/25/15   Linna Hoff, MD  lamoTRIgine (LAMICTAL) 100 MG tablet Take 1 tablet (100 mg total) by mouth 2 (two) times daily. 08/13/16   York Spaniel, MD  metaxalone (SKELAXIN) 800 MG tablet Take 1 tablet (800 mg total) by mouth 3 (three) times daily. For muscle spasm Patient not taking: Reported on 09/02/2016 09/25/15   Linna Hoff, MD  Multiple Vitamin (MULTIVITAMIN WITH MINERALS) TABS tablet Take 1 tablet by mouth daily. Patient not taking: Reported on 09/02/2016  09/11/14   Thermon Leyland, NP  thiamine 100 MG tablet Take 1 tablet (100 mg total) by mouth daily. Patient not taking: Reported on 09/02/2016 09/11/14   Thermon Leyland, NP  tiotropium (SPIRIVA) 18 MCG inhalation capsule Place 1 capsule (18 mcg total) into inhaler and inhale daily as needed (for shortness of breath). 08/03/14   Adonis Brook, NP    Family History Family History  Problem Relation Age of Onset  . Hypertension Father   . Coronary artery disease Father   . Alcohol abuse Father   . Diabetes Mother   . Hypertension Mother   . Healthy Brother   . Coronary artery disease Brother   . Drug abuse Brother   . Drug abuse Brother   .  Coronary artery disease Brother     Social History Social History  Substance Use Topics  . Smoking status: Current Every Day Smoker    Packs/day: 0.50    Types: Cigarettes  . Smokeless tobacco: Never Used  . Alcohol use 8.4 oz/week    8 Cans of beer, 6 Shots of liquor per week     Comment: Unsure of amount / pt. states he drank $150 worth ETOH in 2 days     Allergies   Latex   Review of Systems Review of Systems  Unable to perform ROS: Other     Physical Exam Updated Vital Signs BP 109/64 (BP Location: Left Arm)   Pulse 89   Temp 98.2 F (36.8 C) (Oral)   Resp 18   Ht 6\' 1"  (1.854 m)   Wt 80.7 kg   SpO2 97%   BMI 23.48 kg/m   Physical Exam  Constitutional: He appears well-developed and well-nourished. He is sleeping. No distress.  Sleepy, wakes up and answers, goes right back to sleep.     HENT:  Head: Normocephalic and atraumatic.  Neck: Neck supple.  Cardiovascular: Normal rate and regular rhythm.   Pulmonary/Chest: Effort normal and breath sounds normal. No respiratory distress. He has no wheezes. He has no rales.  Abdominal: Soft. He exhibits no distension and no mass. There is no tenderness. There is no rebound and no guarding.  Neurological: He exhibits normal muscle tone.  Knows where he is, knows it is Tuesday.    Skin: He is not diaphoretic.  Nursing note and vitals reviewed.    ED Treatments / Results  Labs (all labs ordered are listed, but only abnormal results are displayed) Labs Reviewed  COMPREHENSIVE METABOLIC PANEL - Abnormal; Notable for the following:       Result Value   Potassium 3.3 (*)    Glucose, Bld 125 (*)    AST 46 (*)    All other components within normal limits  ETHANOL - Abnormal; Notable for the following:    Alcohol, Ethyl (B) 45 (*)    All other components within normal limits  CBC WITH DIFFERENTIAL/PLATELET - Abnormal; Notable for the following:    MCHC 36.6 (*)    All other components within normal limits  CBC -  Abnormal; Notable for the following:    MCHC 36.9 (*)    All other components within normal limits  CREATININE, SERUM  MAGNESIUM  RAPID URINE DRUG SCREEN, HOSP PERFORMED  HIV ANTIBODY (ROUTINE TESTING)  CBC  COMPREHENSIVE METABOLIC PANEL  PROTIME-INR  RAPID URINE DRUG SCREEN, HOSP PERFORMED    EKG  EKG Interpretation None       Radiology No results found.  Procedures Procedures (including critical care  time)  Medications Ordered in ED Medications  thiamine (VITAMIN B-1) tablet 100 mg (100 mg Oral Given 09/02/16 1523)    Or  thiamine (B-1) injection 100 mg ( Intravenous See Alternative 09/02/16 1523)  folic acid (FOLVITE) tablet 1 mg (1 mg Oral Given 09/02/16 1523)  multivitamin with minerals tablet 1 tablet (1 tablet Oral Given 09/02/16 1523)  LORazepam (ATIVAN) tablet 1 mg (not administered)    Or  LORazepam (ATIVAN) injection 1 mg (not administered)  LORazepam (ATIVAN) injection 0-4 mg (1 mg Intravenous Given 09/02/16 2101)    Followed by  LORazepam (ATIVAN) injection 0-4 mg (not administered)  lamoTRIgine (LAMICTAL) tablet 100 mg (100 mg Oral Given 09/02/16 2053)  enoxaparin (LOVENOX) injection 40 mg (40 mg Subcutaneous Not Given 09/02/16 2054)  acetaminophen (TYLENOL) tablet 650 mg (not administered)    Or  acetaminophen (TYLENOL) suppository 650 mg (not administered)  ondansetron (ZOFRAN) tablet 4 mg (not administered)    Or  ondansetron (ZOFRAN) injection 4 mg (not administered)  potassium chloride SA (K-DUR,KLOR-CON) CR tablet 40 mEq (40 mEq Oral Given 09/02/16 2053)     Initial Impression / Assessment and Plan / ED Course  I have reviewed the triage vital signs and the nursing notes.  Pertinent labs & imaging results that were available during my care of the patient were reviewed by me and considered in my medical decision making (see chart for details).    Pt with hx seizures and alcohol withdrawal seizures presents voluntarily for alcohol and crack detox.  Pt placed  on CIWA protocol.  Admitted to [facility].    Final Clinical Impressions(s) / ED Diagnoses   Final diagnoses:  Alcohol withdrawal syndrome without complication [facility](HCC)  Polysubstance abuse    New Prescriptions Current Discharge Medication List       Trixie Dredgemily Rozanna Cormany, [place] JerseyPA-C 09/02/16 2142    Maia PlanJoshua G Long, MD 09/03/16 [phone]

## 2016-09-02 NOTE — H&P (Signed)
History and Physical    Samuel Atkinson NWG:[id number] DOB: [birth year] DOA: 09/02/2016    PCP: [name], MD  Patient coming from: living in a car  Chief Complaint: brought by wife  HPI: Samuel Atkinson is a 51 y.o. male with medical history significant of HTN, frontal AVM s/p coiling, seizure disorder, alcohol abuse, crack cocaine abuse and compliance issues due to substance abuse. He is sleepy and not wanting to give a history, needs to be constantly awakened. Tells me he has been bing drinking for days now and living in a car. He has been using "as much as he can get of crack cocaine which he specifies is a "couple hundred dollars worth" last used yesterday. He last drink this AM around 11. Wife took him to behavioral med and he was then sent here for twitching of head and neck. He states he has been using drugs and alcohol for 20 yrs.   ED Course: given a dose of IV Ativan a few hrs ago for the twitching. Alcohol level 45.   Review of Systems:  All other systems reviewed and apart from HPI, are negative.  Past Medical History:  Diagnosis Date  . AVM (arteriovenous malformation)   . Hx of substance abuse   . Hypertension   . Seizure disorder (HCC)   . Seizures (HCC)     Past Surgical History:  Procedure Laterality Date  . AVM Embolization  2013    Social History:   reports that he has been smoking Cigarettes.  He has been smoking about 0.50 packs per day. He has never used smokeless tobacco. He reports that he drinks about 8.4 oz of alcohol per week . He reports that he uses drugs, including Cocaine, about 6 times per week.   Loads trucks for a living  Allergies  Allergen Reactions  . Latex Rash    Family History  Problem Relation Age of Onset  . Hypertension Father   . Coronary artery disease Father   . Alcohol abuse Father   . Diabetes Mother   . Hypertension Mother   . Healthy Brother   . Coronary artery disease Brother   . Drug abuse Brother   . Drug  abuse Brother   . Coronary artery disease Brother      Prior to Admission medications   Medication Sig Start Date End Date Taking? Authorizing Provider  amLODipine (NORVASC) 10 MG tablet Take 1 tablet (10 mg total) by mouth daily. 09/11/14  Yes Thermon Leyland, NP  baclofen (LIORESAL) 10 MG tablet TK 1 T PO  WITH FOOD OR MILK TID. 07/07/16  Yes Historical Provider, MD  albuterol (PROVENTIL HFA;VENTOLIN HFA) 108 (90 BASE) MCG/ACT inhaler Inhale 1-2 puffs into the lungs every 6 (six) hours as needed for wheezing or shortness of breath. 08/03/14   Adonis Brook, NP  B Complex-Biotin-FA (SUPER B-50 COMPLEX) CAPS Take 1 capsule by mouth daily. Patient not taking: Reported on 09/02/2016 09/11/14   Thermon Leyland, NP  Calcium-Magnesium (CALCIUM MAGNESIUM 750) 300-300 MG TABS Take 1 tablet by mouth daily. Patient not taking: Reported on 09/02/2016 09/11/14   Thermon Leyland, NP  diclofenac (CATAFLAM) 50 MG tablet Take 1 tablet (50 mg total) by mouth 3 (three) times daily. For back pain Patient not taking: Reported on 09/02/2016 09/25/15   Linna Hoff, MD  lamoTRIgine (LAMICTAL) 100 MG tablet Take 1 tablet (100 mg total) by mouth 2 (two) times daily. 08/13/16   York Spaniel, MD  metaxalone (  SKELAXIN) 800 MG tablet Take 1 tablet (800 mg total) by mouth 3 (three) times daily. For muscle spasm Patient not taking: Reported on 09/02/2016 09/25/15   Linna Hoff, MD  Multiple Vitamin (MULTIVITAMIN WITH MINERALS) TABS tablet Take 1 tablet by mouth daily. Patient not taking: Reported on 09/02/2016 09/11/14   Thermon Leyland, NP  thiamine 100 MG tablet Take 1 tablet (100 mg total) by mouth daily. Patient not taking: Reported on 09/02/2016 09/11/14   Thermon Leyland, NP  tiotropium (SPIRIVA) 18 MCG inhalation capsule Place 1 capsule (18 mcg total) into inhaler and inhale daily as needed (for shortness of breath). 08/03/14   Adonis Brook, NP    Physical Exam: Vitals:   09/02/16 1357 09/02/16 1358  BP: 123/78   Pulse: 97   Resp:  16   Temp: 97.8 F (36.6 C)   TempSrc: Oral   SpO2: 97%   Weight:  80.7 kg (178 lb)  Height:  6\' 1"  (1.854 m)      Constitutional: NAD, calm, comfortable Eyes: PERTLA, lids and conjunctivae normal ENMT: Mucous membranes are moist. Posterior pharynx clear of any exudate or lesions. Normal dentition.  Neck: normal, supple, no masses, no thyromegaly Respiratory: clear to auscultation bilaterally, no wheezing, no crackles. Normal respiratory effort. No accessory muscle use.  Cardiovascular: S1 & S2 heard, regular rate and rhythm, no murmurs / rubs / gallops. No extremity edema. 2+ pedal pulses. No carotid bruits.  Abdomen: No distension, no tenderness, no masses palpated. No hepatosplenomegaly. Bowel sounds normal.  Musculoskeletal: no clubbing / cyanosis. No joint deformity upper and lower extremities. Good ROM, no contractures. Normal muscle tone.  Skin: no rashes, lesions, ulcers. No induration Neurologic: CN 2-12 grossly intact. Sensation intact, DTR normal. Strength 5/5 in all 4 limbs. - intermittent twitching of head and neck to the right.  Psychiatric: difficult to determined judgment and insight.  Sleepy but awakens and oriented x 3. Normal mood.     Labs on Admission: I have personally reviewed following labs and imaging studies  CBC:  Recent Labs Lab 09/02/16 1443  WBC 6.3  NEUTROABS 3.6  HGB 17.0  HCT 46.4  MCV 90.4  PLT 188   Basic Metabolic Panel:  Recent Labs Lab 09/02/16 1443  NA 137  K 3.3*  CL 104  CO2 22  GLUCOSE 125*  BUN 16  CREATININE 1.06  CALCIUM 9.4  MG 2.0   GFR: Estimated Creatinine Clearance: 93.2 mL/min (by C-G formula based on SCr of 1.06 mg/dL). Liver Function Tests:  Recent Labs Lab 09/02/16 1443  AST 46*  ALT 31  ALKPHOS 54  BILITOT 0.9  PROT 6.7  ALBUMIN 3.9   No results for input(s): LIPASE, AMYLASE in the last 168 hours. No results for input(s): AMMONIA in the last 168 hours. Coagulation Profile: No results for  input(s): INR, PROTIME in the last 168 hours. Cardiac Enzymes: No results for input(s): CKTOTAL, CKMB, CKMBINDEX, TROPONINI in the last 168 hours. BNP (last 3 results) No results for input(s): PROBNP in the last 8760 hours. HbA1C: No results for input(s): HGBA1C in the last 72 hours. CBG: No results for input(s): GLUCAP in the last 168 hours. Lipid Profile: No results for input(s): CHOL, HDL, LDLCALC, TRIG, CHOLHDL, LDLDIRECT in the last 72 hours. Thyroid Function Tests: No results for input(s): TSH, T4TOTAL, FREET4, T3FREE, THYROIDAB in the last 72 hours. Anemia Panel: No results for input(s): VITAMINB12, FOLATE, FERRITIN, TIBC, IRON, RETICCTPCT in the last 72 hours. Urine analysis:  Component Value Date/Time   COLORURINE YELLOW 10/26/2015 1948   APPEARANCEUR CLEAR 10/26/2015 1948   LABSPEC 1.023 10/26/2015 1948   PHURINE 5.5 10/26/2015 1948   GLUCOSEU NEGATIVE 10/26/2015 1948   HGBUR NEGATIVE 10/26/2015 1948   BILIRUBINUR NEGATIVE 10/26/2015 1948   KETONESUR NEGATIVE 10/26/2015 1948   PROTEINUR NEGATIVE 10/26/2015 1948   UROBILINOGEN 1.0 10/26/2015 1850   NITRITE NEGATIVE 10/26/2015 1948   LEUKOCYTESUR NEGATIVE 10/26/2015 1948   Sepsis Labs: @LABRCNTIP (procalcitonin:4,lacticidven:4) )No results found for this or any previous visit (from the past 240 hour(s)).   Radiological Exams on Admission: No results found.    Assessment/Plan Principal Problem:   Seizures (HCC) - appear to be focal seizure- there is no mention in the past neuro notes about the type of seizures he has- he was asked to stop Keppra and continue Lamictal as it was suspected Keppar may be causing irritability - resume Lamictal which has not taken in "days" - neuro checks  Active Problems:   Alcohol abuse - follow on CIWA for withdrawal symptoms - Thaimine and Folic acid started    Substance induced mood disorder    Cocaine abuse - admits to using a couple hundred dollars worth of cocaine daily  over the past few days - will check UDS to determine if he has used anything else  Hypokalemia - replace orally and check Mg+  HTN - Norvasc  Back pain? - prior notes state that he drinks due to back pain?- no complaints of pain at this time - has Diclofenac, Skelaxin and Baclofen mentioned on med rec- will not order at this time     DVT prophylaxis: Lovenox  Code Status: Full code  Family Communication:   Disposition Plan: observation, med surg Consults called: none  Admission status: observation- if he developed ETOH withdrawal symptoms or if focal seizures do not resolve, he will then need to stay a second midnight    Alliance Healthcare SystemRIZWAN,Dio Giller MD [facility] Pager: [email or web address] Password [email or web address] 7PM-7AM, please contact night-coverage   09/02/2016, 5:56 PM

## 2016-09-02 NOTE — H&P (Signed)
Behavioral Health Medical Screening Exam  Samuel Atkinson is a 51 y.o. male. [facility] Patient is with wife. Wife provided all the information. Her reports indicated that patient has been out on the streets since Saturday drinking, using Cocaine & sleeping under a bridge. She says he has been drinking alcohol & using Cocaine for many years. He has been to several substance abuse programs without the ability to maintain sobriety. She says, Samuel Atkinson has other 5 brothers who were alcoholics/drug abusers. However, 3 of the brothers has passed but Samuel Atkinson & his twin brother. She reports that Samuel Atkinson has a hx of alcohol withdrawal seizures. 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 She concluded by saying that Samuel Atkinson has been warned by his doctor that any more intense drinking & drug use could worsen this condition. Samuel Atkinson presents with some twitchings to head & facial areas.  Total Time spent with patient: 30 minutes  Psychiatric Specialty Exam: Physical Exam  Constitutional: He appears well-developed.  HENT:  Head: Normocephalic.  Eyes: Pupils are equal, round, and reactive to light.  Neck: Normal range of motion.  Cardiovascular: Normal rate.   Respiratory: Effort normal.  GI: Soft.  Genitourinary:  Genitourinary Comments: Deferred  Musculoskeletal: Normal range of motion.  Neurological: He is alert.  Skin: Skin is warm and dry.    Review of Systems  Constitutional: Positive for malaise/fatigue.  HENT: Negative.   Eyes: Negative.   Respiratory: Negative.   Cardiovascular: Negative.   Gastrointestinal: Negative.   Genitourinary: Negative.   Musculoskeletal: Negative.   Skin: Negative.   Neurological: Negative.   Endo/Heme/Allergies: Negative.   Psychiatric/Behavioral: Positive for substance abuse (Alcohol & Cocaine use disorder.). Negative for depression, hallucinations, memory loss  and suicidal ideas. The patient is nervous/anxious and has insomnia.     Blood pressure 123/78, pulse 97, temperature 97.8 F (36.6 C), temperature source Oral, resp. rate 16, height 6\' 1"  (1.854 m), weight 80.7 kg (178 lb), SpO2 97 %.Body mass index is 23.48 kg/m.  General Appearance: Patient presents sluggish  Eye Contact:  Poor  Speech:  Slow  Volume:  Decreased  Mood:  Appears sad.  Affect:  Flat  Thought Process:  Disorganized and Descriptions of Associations: Loose  Orientation:  Other:  oriented to person.  Thought Content:  Denies any hallucinations, delusional thoughts or paranoia.  Suicidal Thoughts:  Denies  Homicidal Thoughts:  Denies  Memory:  Immediate;   Fair Recent;   Poor Remote;   Poor  Judgement:  Impaired  Insight:  Shallow  Psychomotor Activity:  Some facial twitching present  Concentration: Concentration: Poor and Attention Span: Poor  Recall:  Poor  Fund of Knowledge:Limited  Language: Fair  Akathisia:  Negative  Handed:  Right  AIMS (if indicated):     Assets:  Desire for Improvement Social Support  Sleep:      Musculoskeletal: Strength & Muscle Tone: within normal limits Gait & Station: unsteady Patient leans: Front  Blood pressure 123/78, pulse 97, temperature 97.8 F (36.6 C), temperature source Oral, resp. rate 16, height 6\' 1"  (1.854 m), weight 80.7 kg (178 lb), SpO2 97 %.  Recommendations: Based on my evaluation, the patient appears sluggish, weak & almost incoherent. He has some twitching to head & facial areas. I recommend the patient go to the emergency department for further evaluation of medical condition & to obtain UDS & toxicology  tests. He may be discharged if stable after evaluation.  Sanjuana Kava, NP, PMHNP, FNP-BC 09/02/2016, 1:59 PM

## 2016-09-02 NOTE — ED Notes (Signed)
Pt stated that he cannot urinate. 

## 2016-09-03 ENCOUNTER — Other Ambulatory Visit (HOSPITAL_COMMUNITY): Payer: Self-pay

## 2016-09-03 ENCOUNTER — Telehealth: Payer: Self-pay | Admitting: Neurology

## 2016-09-03 DIAGNOSIS — I10 Essential (primary) hypertension: Secondary | ICD-10-CM | POA: Diagnosis present

## 2016-09-03 DIAGNOSIS — F1010 Alcohol abuse, uncomplicated: Secondary | ICD-10-CM

## 2016-09-03 DIAGNOSIS — E876 Hypokalemia: Secondary | ICD-10-CM

## 2016-09-03 DIAGNOSIS — F1410 Cocaine abuse, uncomplicated: Secondary | ICD-10-CM

## 2016-09-03 LAB — COMPREHENSIVE METABOLIC PANEL
ALT: 28 U/L (ref 17–63)
AST: 38 U/L (ref 15–41)
Albumin: 3.6 g/dL (ref 3.5–5.0)
Alkaline Phosphatase: 55 U/L (ref 38–126)
Anion gap: 7 (ref 5–15)
BUN: 13 mg/dL (ref 6–20)
CO2: 25 mmol/L (ref 22–32)
Calcium: 9 mg/dL (ref 8.9–10.3)
Chloride: 106 mmol/L (ref 101–111)
Creatinine, Ser: 1.06 mg/dL (ref 0.61–1.24)
GFR calc Af Amer: >60 mL/min (ref >60)
GFR calc non Af Amer: >60 mL/min (ref >60)
Glucose, Bld: 165 mg/dL — ABNORMAL HIGH (ref 65–99)
Potassium: 4 mmol/L (ref 3.5–5.1)
Sodium: 138 mmol/L (ref 135–145)
Total Bilirubin: 1.2 mg/dL (ref 0.3–1.2)
Total Protein: 6.3 g/dL — ABNORMAL LOW (ref 6.5–8.1)

## 2016-09-03 LAB — CBC
HCT: 45.9 % (ref 39.0–52.0)
Hemoglobin: 16.3 g/dL (ref 13.0–17.0)
MCH: 32 pg (ref 26.0–34.0)
MCHC: 35.5 g/dL (ref 30.0–36.0)
MCV: 90.2 fL (ref 78.0–100.0)
Platelets: 192 10*3/uL (ref 150–400)
RBC: 5.09 MIL/uL (ref 4.22–5.81)
RDW: 12.4 % (ref 11.5–15.5)
WBC: 4.5 10*3/uL (ref 4.0–10.5)

## 2016-09-03 LAB — RAPID URINE DRUG SCREEN, HOSP PERFORMED
Amphetamines: NOT DETECTED
BARBITURATES: NOT DETECTED
BENZODIAZEPINES: POSITIVE — AB
COCAINE: POSITIVE — AB
Opiates: NOT DETECTED
TETRAHYDROCANNABINOL: NOT DETECTED

## 2016-09-03 LAB — PROTIME-INR
INR: 1.03
PROTHROMBIN TIME: 13.5 s (ref 11.4–15.2)

## 2016-09-03 MED ORDER — DICLOFENAC SODIUM 1 % TD GEL
4.0000 g | Freq: Four times a day (QID) | TRANSDERMAL | Status: DC
  Administered 2016-09-03 – 2016-09-06 (×4): 4 g via TOPICAL
  Filled 2016-09-03 (×2): qty 100

## 2016-09-03 MED ORDER — SODIUM CHLORIDE 0.9 % IV BOLUS (SEPSIS)
1000.0000 mL | Freq: Once | INTRAVENOUS | Status: AC
  Administered 2016-09-03: 1000 mL via INTRAVENOUS

## 2016-09-03 MED ORDER — SODIUM CHLORIDE 0.9 % IV SOLN
1000.0000 mg | INTRAVENOUS | Status: AC
  Administered 2016-09-03: 1000 mg via INTRAVENOUS
  Filled 2016-09-03: qty 10

## 2016-09-03 NOTE — Progress Notes (Signed)
Contacted [facility] to alert that patient has bed assigned and [facility]. No ETA at this time, will callback when they are en route. Will continue to monitor.

## 2016-09-03 NOTE — Progress Notes (Signed)
[facility] arrived to pick up pt. Pt A&Ox4, stable. [facility]. [facility] in [facility].

## 2016-09-03 NOTE — Progress Notes (Signed)
PHARMACIST - PHYSICIAN COMMUNICATION  DR:   Butler Denmarkizwan  CONCERNING: IV to Oral Route Change Policy  RECOMMENDATION: This patient is receiving Thiamine B-1 100mg  by the intravenous route.  Based on criteria approved by the Pharmacy and Therapeutics Committee, the intravenous medication(s) is/are being converted to the equivalent oral dose form(s).   DESCRIPTION: These criteria include:  The patient is eating (either orally or via tube) and/or has been taking other orally administered medications for a least 24 hours  The patient has no evidence of active gastrointestinal bleeding or impaired GI absorption (gastrectomy, short bowel, patient on TNA or NPO).  If you have questions about this conversion, please contact the Pharmacy Department  []   [phone]( [phone] )  [facility] []   [phone]( [phone] )  [facility] []   [phone]( [phone] )  [facility] []   [phone]( [phone] )  [facility] [x]   [phone]( [phone] )  [facility]   Claris PongKevin Q Kellene Mccleary, Oregontudent-PharmD 09/03/2016 9:57 AM

## 2016-09-03 NOTE — Progress Notes (Signed)
Contacted pt wife and notified that pt will be transferring to [address]. Wife verbalized understanding.

## 2016-09-03 NOTE — Progress Notes (Signed)
[facility] [address]. Attempted to call report but nurse unavailable and will call back.

## 2016-09-03 NOTE — Progress Notes (Signed)
Report given to [facility] Ale[facility]Leticia [facility] [address]. Advised that I will call back once pt is in route.

## 2016-09-03 NOTE — Progress Notes (Signed)
Initial Nutrition Assessment  DOCUMENTATION CODES:   Not applicable  INTERVENTION:   Snacks  MVI  NUTRITION DIAGNOSIS:   Increased nutrient needs related to other (see comment) (alcohol abuse), drug abuse, lives in car as evidenced by increased estimated needs from protein, mild depletion of muscle mass.  GOAL:   Patient will meet greater than or equal to 90% of their needs  MONITOR:   PO intake, Weight trends, Labs  REASON FOR ASSESSMENT:   Malnutrition Screening Tool    ASSESSMENT:   51 y.o. male with medical history significant of HTN, frontal AVM s/p coiling, seizure disorder, alcohol abuse, crack cocaine abuse and compliance issues due to substance abuse.   Met with pt in room today. Pt reports good appetite pta and currently. Pt is eating 100% of meals. Pt does not appear to be malnourished but RD suspects that pt may be malnourished secondary to drug and alcohol abuse and pt lives in his car. Pt reports that he always has access to food. Per chart, pt is weight stable. Pt does not like supplements but would like to have snacks.   Medications reviewed and include: lovenox, folic acid, MVI, thiamine   Labs reviewed: wnl  Nutrition-Focused physical exam completed. Findings are no fat depletion, mild muscle depletion in claivicle and patellar regions, and no edema.   Diet Order:  Diet regular Room service appropriate? Yes; Fluid consistency: Thin  Skin:  Reviewed, no issues  Last BM:  none since admit  Height:   Ht Readings from Last 1 Encounters:  09/02/16 _0  (1.854 m)    Weight:   Wt Readings from Last 1 Encounters:  09/02/16 178 lb (80.7 kg)    Ideal Body Weight:  83.6 kg  BMI:  Body mass index is 23.48 kg/m.  Estimated Nutritional Needs:   Kcal:  2000-2400kcal/day   Protein:  89-105g/day   Fluid:  >2L/day   EDUCATION NEEDS:   No education needs identified at this time  Koleen Distance, RD, LDN Pager #[phone]

## 2016-09-03 NOTE — Telephone Encounter (Signed)
I got a call from the hospitalist concerning this patient. The patient has been admitted, he was on another alcohol and cocaine binge. He has likely not been taking his antiepileptic medications properly. He presents with rhythmic jerking of the head to the right. He does have a history of an AVM that was coiled. The patient likely is having focal seizures, they will get a neuro hospitalist consultation for him.

## 2016-09-03 NOTE — Progress Notes (Signed)
PROGRESS NOTE    Aleksandar Duve   RUE:[id number]  DOB: Nov 24, 1965  DOA: 09/02/2016 PCP: [name], MD   HPI: Samuel Atkinson is a 51 y.o. male with medical history significant of HTN, frontal AVM s/p coiling, seizure disorder, alcohol abuse, crack cocaine abuse and compliance issues due to substance abuse. He is sleepy and not wanting to give a history, needs to be constantly awakened. Tells me he has been bing drinking for days now and living in a car. He has been using "as much as he can get of crack cocaine which he specifies is a "couple hundred dollars worth" last used yesterday. He last drink this AM around 11. Wife took him to behavioral med and he was then sent here for twitching of head and neck. He states he has been using drugs and alcohol for 20 yrs.   Subjective: Again, difficult to obtain history. Does not make eye contact. Tells me thatt he has been drinking vodka heavily and using Cocaine for 4 days now. He was drinking on a daily basis but less prior to that. He has not been taking his Lamictal and tells me the prescription is a Counselling psychologist and he has not picked it up.  Per RN, urine ouput is poor. He tells me he did walk to the bathroom last night and urinated, did not tell anyone and no UDS was collected. Currently has ~150 cc in bladder per scan.   Assessment & Plan:   Principal Problem:   Seizures with h/o frontal AVM s/p coiling - focal- Lamictal started yesterday - Improve only when given Ativan.  - spoke with Dr Anne Hahn who states that his prior seizure episodes have been staring, blinking and confusion lasting about 30 sec. Per our discussion, it was decided to consult neuro hospitalists. I then spoke with Dr Roxy Manns who took information on patient. Later called by Neuro PA and advised that they would only be able to come to [facility] long once today to see him and not follow up as there is not enough staff. Recommended transfer to [facility] if we need daily follow  up.  - have placed Transfer order- for now, continue PRN Ativan, BID Lamictal and obtain EEG preferably when patient is having an episode  ETOH abuse - binge drinking and daily abuse per history - has had a headache and other wise no signs of withdrawal as of yet - CIWA scale - Thaimine and Folic acid started  Crack Cocaine use admits to using a couple hundred dollars worth of cocaine daily over the past few days - advised to discontinue - UDS still pending   Hypokalemia - replaced orally-  Mg+normal  Poor Urine output-  apparently has urinated once in  ~18 hrs but no one has seen him - likely dehydrated due to days of drinking- per RN he is eating and drinking well today but will give a 1 L NS bolus to help him catch up    HTN - Norvasc  Back pain? - prior notes state that he drinks due to back pain?-having pain today- tender in lumbar area bilaterally but Lmore than R area- start Voltaren Gel - has Diclofenac, Skelaxin and Baclofen mentioned on med rec - states that Dr Abigail Miyamoto prescribes his meds - will not order at this time - he is receiving Ativan which should suffice as a muscle relaxant for now  Noncompliance with medications   DVT prophylaxis: Lovenox Code Status: Full code Family Communication:  Disposition Plan: transfer  to [facility] Consultants:   Neuro states they will see him at [facility] Procedures:    Antimicrobials:  Anti-infectives    None       Objective: Vitals:   09/02/16 1854 09/02/16 2050 09/03/16 0547 09/03/16 0812  BP: 105/55 109/64 (!) 83/52 (!) 118/58  Pulse: 91 89 86   Resp: 18 18 18    Temp: 97.8 F (36.6 C) 98.2 F (36.8 C) 98.7 F (37.1 C)   TempSrc: Oral Oral Oral   SpO2: 93% 97% 98%   Weight:      Height:        Intake/Output Summary (Last 24 hours) at 09/03/16 1219 Last data filed at 09/03/16 0800  Gross per 24 hour  Intake              240 ml  Output                0 ml  Net              240 ml   Filed  Weights   09/02/16 1358  Weight: 80.7 kg (178 lb)    Examination: General exam: Appears comfortable  HEENT: PERRLA, oral mucosa moist, no sclera icterus or thrush Respiratory system: Clear to auscultation. Respiratory effort normal. Cardiovascular system: S1 & S2 heard, RRR.  No murmurs  Gastrointestinal system: Abdomen soft, non-tender, nondistended. Normal bowel sound. No organomegaly Central nervous system: Alert and oriented. No focal neurological deficits. Musculoskeletal: b/l lumbar tenderness L > R Extremities: No cyanosis, clubbing or edema Skin: No rashes or ulcers Psychiatry:  Flat affect, poor eye contact, tends to mumble when he talks    Data Reviewed: I have personally reviewed following labs and imaging studies  CBC:  Recent Labs Lab 09/02/16 1443 09/02/16 1715 09/03/16 0638  WBC 6.3 5.7 4.5  NEUTROABS 3.6  --   --   HGB 17.0 16.2 16.3  HCT 46.4 43.9 45.9  MCV 90.4 88.9 90.2  PLT 188 196 192   Basic Metabolic Panel:  Recent Labs Lab 09/02/16 1443 09/02/16 1715 09/03/16 0638  NA 137  --  138  K 3.3*  --  4.0  CL 104  --  106  CO2 22  --  25  GLUCOSE 125*  --  165*  BUN 16  --  13  CREATININE 1.06 0.89 1.06  CALCIUM 9.4  --  9.0  MG 2.0  --   --    GFR: Estimated Creatinine Clearance: 93.2 mL/min (by C-G formula based on SCr of 1.06 mg/dL). Liver Function Tests:  Recent Labs Lab 09/02/16 1443 09/03/16 0638  AST 46* 38  ALT 31 28  ALKPHOS 54 55  BILITOT 0.9 1.2  PROT 6.7 6.3*  ALBUMIN 3.9 3.6   No results for input(s): LIPASE, AMYLASE in the last 168 hours. No results for input(s): AMMONIA in the last 168 hours. Coagulation Profile:  Recent Labs Lab 09/03/16 0638  INR 1.03   Cardiac Enzymes: No results for input(s): CKTOTAL, CKMB, CKMBINDEX, TROPONINI in the last 168 hours. BNP (last 3 results) No results for input(s): PROBNP in the last 8760 hours. HbA1C: No results for input(s): HGBA1C in the last 72 hours. CBG: No  results for input(s): GLUCAP in the last 168 hours. Lipid Profile: No results for input(s): CHOL, HDL, LDLCALC, TRIG, CHOLHDL, LDLDIRECT in the last 72 hours. Thyroid Function Tests: No results for input(s): TSH, T4TOTAL, FREET4, T3FREE, THYROIDAB in the last 72 hours. Anemia Panel: No results for  input(s): VITAMINB12, FOLATE, FERRITIN, TIBC, IRON, RETICCTPCT in the last 72 hours. Urine analysis:    Component Value Date/Time   COLORURINE YELLOW 10/26/2015 1948   APPEARANCEUR CLEAR 10/26/2015 1948   LABSPEC 1.023 10/26/2015 1948   PHURINE 5.5 10/26/2015 1948   GLUCOSEU NEGATIVE 10/26/2015 1948   HGBUR NEGATIVE 10/26/2015 1948   BILIRUBINUR NEGATIVE 10/26/2015 1948   KETONESUR NEGATIVE 10/26/2015 1948   PROTEINUR NEGATIVE 10/26/2015 1948   UROBILINOGEN 1.0 10/26/2015 1850   NITRITE NEGATIVE 10/26/2015 1948   LEUKOCYTESUR NEGATIVE 10/26/2015 1948   Sepsis Labs: @LABRCNTIP (procalcitonin:4,lacticidven:4) )No results found for this or any previous visit (from the past 240 hour(s)).       Radiology Studies: No results found.    Scheduled Meds: . diclofenac sodium  4 g Topical QID  . enoxaparin (LOVENOX) injection  40 mg Subcutaneous Q24H  . folic acid  1 mg Oral Daily  . lamoTRIgine  100 mg Oral BID  . LORazepam  0-4 mg Intravenous Q6H   Followed by  . [START ON 09/04/2016] LORazepam  0-4 mg Intravenous Q12H  . multivitamin with minerals  1 tablet Oral Daily  . sodium chloride  1,000 mL Intravenous Once  . thiamine  100 mg Oral Daily   Continuous Infusions:   LOS: 1 day    Time spent in minutes: 55     Adeena Bernabe, MD [facility] Pager: [email or web address] Password [email or web address] 09/03/2016, 12:19 PM

## 2016-09-03 NOTE — Progress Notes (Signed)
[facility]

## 2016-09-04 ENCOUNTER — Inpatient Hospital Stay (HOSPITAL_COMMUNITY): Payer: Federal, State, Local not specified - PPO

## 2016-09-04 DIAGNOSIS — F339 Major depressive disorder, recurrent, unspecified: Secondary | ICD-10-CM

## 2016-09-04 DIAGNOSIS — F10230 Alcohol dependence with withdrawal, uncomplicated: Secondary | ICD-10-CM

## 2016-09-04 DIAGNOSIS — F1910 Other psychoactive substance abuse, uncomplicated: Secondary | ICD-10-CM

## 2016-09-04 DIAGNOSIS — F10930 Alcohol use, unspecified with withdrawal, uncomplicated: Secondary | ICD-10-CM

## 2016-09-04 DIAGNOSIS — R569 Unspecified convulsions: Secondary | ICD-10-CM

## 2016-09-04 LAB — BASIC METABOLIC PANEL
Anion gap: 6 (ref 5–15)
BUN: 6 mg/dL (ref 6–20)
CHLORIDE: 105 mmol/L (ref 101–111)
CO2: 26 mmol/L (ref 22–32)
Calcium: 8.6 mg/dL — ABNORMAL LOW (ref 8.9–10.3)
Creatinine, Ser: 0.97 mg/dL (ref 0.61–1.24)
GFR calc non Af Amer: >60 mL/min (ref >60)
Glucose, Bld: 91 mg/dL (ref 65–99)
POTASSIUM: 3.9 mmol/L (ref 3.5–5.1)
SODIUM: 137 mmol/L (ref 135–145)

## 2016-09-04 LAB — MAGNESIUM: MAGNESIUM: 1.7 mg/dL (ref 1.7–2.4)

## 2016-09-04 LAB — HIV ANTIBODY (ROUTINE TESTING W REFLEX): HIV Screen 4th Generation wRfx: NONREACTIVE

## 2016-09-04 MED ORDER — SODIUM CHLORIDE 0.9 % IV SOLN
1000.0000 mg | Freq: Two times a day (BID) | INTRAVENOUS | Status: AC
  Administered 2016-09-04: 1000 mg via INTRAVENOUS
  Filled 2016-09-04: qty 10

## 2016-09-04 MED ORDER — SODIUM CHLORIDE 0.9 % IV SOLN
500.0000 mg | Freq: Two times a day (BID) | INTRAVENOUS | Status: DC
  Administered 2016-09-04 – 2016-09-05 (×2): 500 mg via INTRAVENOUS
  Filled 2016-09-04 (×3): qty 5

## 2016-09-04 MED ORDER — BACLOFEN 10 MG PO TABS
10.0000 mg | ORAL_TABLET | Freq: Three times a day (TID) | ORAL | Status: DC
  Administered 2016-09-04 – 2016-09-06 (×8): 10 mg via ORAL
  Filled 2016-09-04 (×8): qty 1

## 2016-09-04 MED ORDER — NICOTINE 21 MG/24HR TD PT24
21.0000 mg | MEDICATED_PATCH | Freq: Every day | TRANSDERMAL | Status: DC
  Filled 2016-09-04 (×2): qty 1

## 2016-09-04 MED ORDER — MAGNESIUM SULFATE 4 GM/100ML IV SOLN
4.0000 g | Freq: Once | INTRAVENOUS | Status: AC
  Administered 2016-09-04: 4 g via INTRAVENOUS
  Filled 2016-09-04: qty 100

## 2016-09-04 NOTE — Progress Notes (Addendum)
Date: 09/04/2016 Patient: Samuel Atkinson Admitted: 09/02/2016  2:01 PM Attending Provider: Rodolph Bonganiel Thompson V, MD  Samuel Atkinson or his authorized caregiver has made the decision for the patient to leave the [facility] unit   against the advice of Rodolph Bonganiel Thompson V, MD.  He or his authorized caregiver has been informed and understands the inherent risks, including death.  He or his authorized caregiver has decided to accept the responsibility for this decision. Samuel Atkinson and all necessary parties have been advised that he may return for further evaluation or treatment. His condition at time of discharge was Fair.  Samuel Atkinson had current vital signs as follows:  Blood pressure 122/67, pulse 89, temperature 98 F (36.7 C), temperature source Oral, resp. rate 19, height 6\' 1"  (1.854 m), weight 76.3 kg (168 lb 4.8 oz), SpO2 98 %.   Samuel Atkinson has signed the Leaving Against Medical Advice form prior to leaving the department.  Melvenia NeedlesIreti O Matilda Fleig 09/04/2016

## 2016-09-04 NOTE — Progress Notes (Signed)
Pt was brought back to the floor in wheel by a nurse.

## 2016-09-04 NOTE — Progress Notes (Signed)
PROGRESS NOTE    Samuel Atkinson  ZOX:[id number] DOB: [birth year] DOA: 09/02/2016 PCP: [name], MD    Brief Narrative:  Sherrine Maples a 51 y.o.malewith medical history significant of HTN, frontal AVM s/p coiling, seizure disorder, alcohol abuse, crack cocaine abuse and compliance issues due to substance abuse. He was sleepy and not wanting to give a history, needed to be constantly awakened. Patient told admitting physician that he had been binge drinking for days now and living in a car. He had been using "as much as he can get of crack cocaine which he specifies is a "couple hundred dollars worth" last used 1 day prior to admission. His last drink this AM around 11. Wife took him to behavioral med and he was then sent here for twitching of head and neck. He stated he has been using drugs and alcohol for 20 yrs.  Patient was admitted for seizures and [facility] by neurology.    Assessment & Plan:   Principal Problem:   Seizures (HCC) Active Problems:   Cerebral AVM   Alcohol abuse   Substance induced mood disorder (HCC)   Major depressive disorder   Cocaine abuse   Benign essential HTN   Alcohol withdrawal syndrome without complication (HCC)   Polysubstance abuse  #1 breakthrough seizures Likely secondary to noncompliance to her anticonvulsant medications in the setting of polysubstance abuse of cocaine use, alcohol toxicity. Patient alert and following commands. No focal neurological deficits. Patient fixated on going out to smoke a cigarette. No further seizures noted. EEG with no acute abnormalities. Head CT negative. Patient was loaded with IV Keppra. Patient currently on IV Keppra 500 mg twice daily and oral Lamictal. Neurology following and appreciate input and recommendations.  #2 major depressive disorder Patient with a history of major depressive disorder. Patient with a flat affect. Patient also with some  passive suicidal ideations. Family request in psychiatric consultation. We'll consult with psychiatry for further evaluation and management.  #3 polysubstance abuse/cocaine abuse Family states patient wanting help for his polysubstance abuse. Family hoping patient when medically stable Be transferred to a rehabilitation facility from the hospital. Psych consultation pending. Also consult with social worker for further evaluation and recommendations.  #4 hypertension Stable.  #6 history of alcohol abuse Alcohol cessation. Continue the Ativan withdrawal protocol.  #7 tobacco abuse Tobacco cessation. Nicotine patch.    DVT prophylaxis: Lovenox Code Status: Full Family Communication: Updated patient and wife at bedside. Disposition Plan: Pending neurology and psychiatric evaluation.   Consultants:   Neurology: Dr.Lindzen 09/04/2016  Psychiatry pending  Procedures:   EEG 09/04/2016  CT head 09/04/2016  Antimicrobials:   None   Subjective: Patient with no further seizures noted. Patient asking whether he can go outside and smoke. No chest pain. No shortness of breath. Per wife patient with some visual hallucinations feeling somewhat easing the corner into room. Patient denies any auditory hallucinations. Patient denies any suicidal or homicidal ideation.  Objective: Vitals:   09/03/16 2334 09/04/16 0503 09/04/16 1424 09/04/16 1426  BP: (!) 105/51 111/65 122/67 122/67  Pulse: 69 69 90 89  Resp: 18 18  19   Temp: 98.2 F (36.8 C) 97.6 F (36.4 C)  98 F (36.7 C)  TempSrc: Oral Oral  Oral  SpO2: 95% 96%  98%  Weight: 76.3 kg (168 lb 4.8 oz)     Height: 6\' 1"  (1.854 m)       Intake/Output Summary (Last 24 hours) at 09/04/16 2027  Last data filed at 09/04/16 1906  Gross per 24 hour  Intake             1360 ml  Output              875 ml  Net              485 ml   Filed Weights   09/02/16 1358 09/03/16 2334  Weight: 80.7 kg (178 lb) 76.3 kg (168 lb 4.8 oz)     Examination:  General exam: Appears calm and comfortable  Respiratory system: Clear to auscultation. Respiratory effort normal. Cardiovascular system: S1 & S2 heard, RRR. No JVD, murmurs, rubs, gallops or clicks. No pedal edema. Gastrointestinal system: Abdomen is nondistended, soft and nontender. No organomegaly or masses felt. Normal bowel sounds heard. Central nervous system: Alert and oriented. No focal neurological deficits. Extremities: Symmetric 5 x 5 power. Skin: No rashes, lesions or ulcers Psychiatry: Judgement and insight appear fair. Mood & affect flat.     Data Reviewed: I have personally reviewed following labs and imaging studies  CBC:  Recent Labs Lab 09/02/16 1443 09/02/16 1715 09/03/16 0638  WBC 6.3 5.7 4.5  NEUTROABS 3.6  --   --   HGB 17.0 16.2 16.3  HCT 46.4 43.9 45.9  MCV 90.4 88.9 90.2  PLT 188 196 192   Basic Metabolic Panel:  Recent Labs Lab 09/02/16 1443 09/02/16 1715 09/03/16 0638 09/04/16 0857  NA 137  --  138 137  K 3.3*  --  4.0 3.9  CL 104  --  106 105  CO2 22  --  25 26  GLUCOSE 125*  --  165* 91  BUN 16  --  13 6  CREATININE 1.06 0.89 1.06 0.97  CALCIUM 9.4  --  9.0 8.6*  MG 2.0  --   --  1.7   GFR: Estimated Creatinine Clearance: 97.2 mL/min (by C-G formula based on SCr of 0.97 mg/dL). Liver Function Tests:  Recent Labs Lab 09/02/16 1443 09/03/16 0638  AST 46* 38  ALT 31 28  ALKPHOS 54 55  BILITOT 0.9 1.2  PROT 6.7 6.3*  ALBUMIN 3.9 3.6   No results for input(s): LIPASE, AMYLASE in the last 168 hours. No results for input(s): AMMONIA in the last 168 hours. Coagulation Profile:  Recent Labs Lab 09/03/16 0638  INR 1.03   Cardiac Enzymes: No results for input(s): CKTOTAL, CKMB, CKMBINDEX, TROPONINI in the last 168 hours. BNP (last 3 results) No results for input(s): PROBNP in the last 8760 hours. HbA1C: No results for input(s): HGBA1C in the last 72 hours. CBG: No results for input(s): GLUCAP in the  last 168 hours. Lipid Profile: No results for input(s): CHOL, HDL, LDLCALC, TRIG, CHOLHDL, LDLDIRECT in the last 72 hours. Thyroid Function Tests: No results for input(s): TSH, T4TOTAL, FREET4, T3FREE, THYROIDAB in the last 72 hours. Anemia Panel: No results for input(s): VITAMINB12, FOLATE, FERRITIN, TIBC, IRON, RETICCTPCT in the last 72 hours. Sepsis Labs: No results for input(s): PROCALCITON, LATICACIDVEN in the last 168 hours.  No results found for this or any previous visit (from the past 240 hour(s)).       Radiology Studies: Ct Head Wo Contrast  Result Date: 09/04/2016 CLINICAL DATA:  Initial evaluation for breakthrough seizure, altered mental status, left facial droop. Evaluate for possible stroke. EXAM: CT HEAD WITHOUT CONTRAST TECHNIQUE: Contiguous axial images were obtained from the base of the skull through the vertex without intravenous contrast. COMPARISON:  Prior CT  from 07/28/2014. FINDINGS: Brain: Extensive streak artifact from treated AVM at the parasagittal anterior right frontal lobe mildly limits evaluation. 3 cerebral volume within normal limits. No evidence for acute intracranial hemorrhage. No findings to suggest acute large vessel territory infarct. No mass lesion, midline shift or mass effect. No hydrocephalus. No extra-axial fluid collection. Vascular: No hyperdense vessel. Skull: Scalp soft tissues demonstrate no acute abnormality. Calvarium intact. Sinuses/Orbits: Globes and orbital soft tissues within normal limits. Paranasal sinuses are clear. The no mastoid effusion. Other: No other significant finding. IMPRESSION: 1. No acute intracranial infarct or other process identified. 2. Sequela prior coil embolization for right frontal AVM. Electronically Signed   By: Rise Mu M.D.   On: 09/04/2016 05:18        Scheduled Meds: . baclofen  10 mg Oral TID  . diclofenac sodium  4 g Topical QID  . enoxaparin (LOVENOX) injection  40 mg Subcutaneous Q24H  .  folic acid  1 mg Oral Daily  . lamoTRIgine  100 mg Oral BID  . levETIRAcetam  500 mg Intravenous Q12H  . LORazepam  0-4 mg Intravenous Q6H   Followed by  . LORazepam  0-4 mg Intravenous Q12H  . multivitamin with minerals  1 tablet Oral Daily  . nicotine  21 mg Transdermal Daily  . thiamine  100 mg Oral Daily   Continuous Infusions:   LOS: 2 days    Time spent: 35 minutes    THOMPSON,DANIEL, MD [facility] Pager [phone]  If 7PM-7AM, please contact night-coverage [email or web address] Password [facility] 09/04/2016, 8:27 PM

## 2016-09-04 NOTE — Procedures (Signed)
ELECTROENCEPHALOGRAM REPORT  Date of Study: 09/04/2016  Patient's Name: Samuel Atkinson MRN: [id number] Date of Birth: 1966-02-22  Referring Provider: Caryl PinaEric Lindzen, MD  Clinical History: 51 year old man with history of seizure disorder and substance abuse presents with breakthrough seizures.  Medications: acetaminophen (TYLENOL) 650 mg  baclofen (LIORESAL) tablet 10 mg  diclofenac sodium (VOLTAREN) 1 % transdermal gel 4 g enoxaparin (LOVENOX) injection 40 mg  folic acid (FOLVITE) tablet 1 mg  lamoTRIgine (LAMICTAL) tablet 100 mg  levETIRAcetam (KEPPRA) 500 mg LORazepam (ATIVAN)  multivitamin with minerals tablet 1 tablet  ondansetron (ZOFRAN)   thiamine (VITAMIN B-1) tablet 100 mg   Technical Summary: A multichannel digital EEG recording measured by the international 10-20 system with electrodes applied with paste and impedances below 5000 ohms performed in our laboratory with EKG monitoring in an awake and drowsy patient.  Hyperventilation was not performed due to lethargy.  Photic stimulation was performed.  The digital EEG was referentially recorded, reformatted, and digitally filtered in a variety of bipolar and referential montages for optimal display.    Description: The patient is awake and drowsy during the recording.  During maximal wakefulness, there is a symmetric, medium voltage 10 Hz posterior dominant rhythm that attenuates with eye opening.  The record is symmetric.  During drowsiness, there is an increase in theta slowing of the background.  Stage 2 sleep was not seen.  Photic stimulation did not elicit any abnormalities.  There were no epileptiform discharges or electrographic seizures seen.    EKG lead was unremarkable.  Impression: This awake and drowsy EEG is normal.    Clinical Correlation: A normal EEG does not exclude a clinical diagnosis of epilepsy.  If further clinical questions remain, prolonged EEG may be helpful.  Clinical correlation is  advised.   Shon MilletAdam Shavon Zenz, DO

## 2016-09-04 NOTE — Progress Notes (Signed)
EEG completed, results pending. 

## 2016-09-04 NOTE — Progress Notes (Signed)
Pt was admitted from [facility] per stretcher by the care link,ID bracelet checked self introduced to pt,  on arrival pt was drowsy and speech was slurred, pt was able to answer all questions correctly, oriented pt has lt sided facial weakness Dr Butler Denmarkizwan came to review pt ordered ct of the head result came back negative for acute intracranial infarct, pt is weak unsteady on his gait bed alarm has been turned on treatment started and will continue to monitor

## 2016-09-04 NOTE — Consult Note (Signed)
NEURO HOSPITALIST CONSULT NOTE   Requesting physician: Dr. Butler Denmarkizwan  Reason for Consult: Breakthrough seizures in the context of medication noncompliance as well as EtOH and cocaine abuse  History obtained from:   Chart   HPI:                                                                                                                                          Samuel Atkinson is an 51 y.o. male who presented on 3/6 to behavioral health with breakthrough seizure activity involving the head and facial areas. [facility] when he was noted to be "sluggish, weak and almost incoherent". He had been using EtOH and cocaine heavily and also most likely had not been taking his anticonvulsants properly. He presented with rhythmic jerking of the head to the right. He does have a history of an AVM that was coiled. He was felt most likely to be having focal seizures.   Note from behavioral health dated 09/02/16 was reviewed: "...reports indicated that patient has been out on the streets since Saturday drinking, using Cocaine & sleeping under a bridge. She says he has been drinking alcohol & using Cocaine for many years. He has been to several substance abuse programs without the ability to maintain sobriety. She says, Samuel Atkinson has other 5 brothers who were alcoholics/drug abusers. However, 3 of the brothers has passed but Samuel Atkinson & his twin brother. She reports that Samuel Atkinson has a hx of alcohol withdrawal seizures. 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 She concluded by saying that Samuel Atkinson has been warned by his doctor that any more intense drinking & drug use could worsen this condition. Samuel Atkinson presents with some twitchings to head & facial areas."  He had recently been prescribed with Keppra and Lamictal, unsure if this was dual therapy or one medication switched to another, but again, was thought to  be noncompliant. He was taken off his Keppra in February and "put back on Lamictal". Per Dr. Anne HahnWillis' note this admission, the patient was likely noncompliant with the recently re-prescribed Lamictal.   He received a loading dose of Keppra 1000 mg on 3/6 but this was not continued as a scheduled dose. Lamictal 100 mg po BID was restarted and he has received 3 doses during his hospital stay. He has not yet had an EEG or a CT head.   Home medications listed in EPIC are as follows: amLODipine (NORVASC) 10 MG tablet Take 1 tablet (10 mg total) by mouth daily. Thermon LeylandLaura A Davis, NP Needs Review  baclofen (LIORESAL) 10 MG tablet TK 1 T PO WITH FOOD OR MILK TID. Historical Provider, MD Needs Review  albuterol (PROVENTIL HFA;VENTOLIN HFA) 108 (90 BASE) MCG/ACT inhaler Inhale 1-2 puffs into the lungs every 6 (six) hours as needed for wheezing or shortness of breath. Adonis Brook, NP Needs Review  B Complex-Biotin-FA (SUPER B-50 COMPLEX) CAPS Take 1 capsule by mouth daily. Thermon Leyland, NP Needs Review   Patient not taking: Reported on 09/02/2016    Calcium-Magnesium (CALCIUM MAGNESIUM 750) 300-300 MG TABS Take 1 tablet by mouth daily. Thermon Leyland, NP Needs Review   Patient not taking: Reported on 09/02/2016    diclofenac (CATAFLAM) 50 MG tablet Take 1 tablet (50 mg total) by mouth 3 (three) times daily. For back pain Samuel Hoff, MD Needs Review   Patient not taking: Reported on 09/02/2016    metaxalone (SKELAXIN) 800 MG tablet Take 1 tablet (800 mg total) by mouth 3 (three) times daily. For muscle spasm Samuel Hoff, MD Needs Review   Patient not taking: Reported on 09/02/2016    Multiple Vitamin (MULTIVITAMIN WITH MINERALS) TABS tablet Take 1 tablet by mouth daily. Thermon Leyland, NP Needs Review   Patient not taking: Reported on 09/02/2016    thiamine 100 MG tablet Take 1 tablet (100 mg total) by mouth daily. Thermon Leyland, NP Needs Review   Patient not taking: Reported on 09/02/2016    tiotropium (SPIRIVA) 18  MCG inhalation capsule Place 1 capsule (18 mcg total) into inhaler and inhale daily as needed (for shortness of breath). Adonis Brook, NP Needs Review     Past Medical History:  Diagnosis Date  . AVM (arteriovenous malformation)   . Hx of substance abuse   . Hypertension   . Seizure disorder (HCC)   . Seizures (HCC)     Past Surgical History:  Procedure Laterality Date  . AVM Embolization  2013    Family History  Problem Relation Age of Onset  . Hypertension Father   . Coronary artery disease Father   . Alcohol abuse Father   . Diabetes Mother   . Hypertension Mother   . Healthy Brother   . Coronary artery disease Brother   . Drug abuse Brother   . Drug abuse Brother   . Coronary artery disease Brother    Social History:  reports that he has been smoking Cigarettes.  He has been smoking about 0.50 packs per day. He has never used smokeless tobacco. He reports that he drinks about 8.4 oz of alcohol per week . He reports that he uses drugs, including Cocaine, about 6 times per week.  Allergies  Allergen Reactions  . Latex Rash    MEDICATIONS:                                                                                                                     Scheduled: . diclofenac sodium  4 g Topical QID  . enoxaparin (LOVENOX) injection  40 mg Subcutaneous Q24H  . folic acid  1 mg Oral Daily  . lamoTRIgine  100 mg Oral BID  .  levETIRAcetam  1,000 mg Intravenous Q12H  . levETIRAcetam  500 mg Intravenous Q12H  . LORazepam  0-4 mg Intravenous Q6H   Followed by  . LORazepam  0-4 mg Intravenous Q12H  . multivitamin with minerals  1 tablet Oral Daily  . thiamine  100 mg Oral Daily    ROS:                                                                                                                                       Unable to obtain due to somnolence/obtundation.   Blood pressure (!) 105/51, pulse 69, temperature 98.2 F (36.8 C), temperature source Oral, resp.  rate 18, height 6\' 1"  (1.854 m), weight 76.3 kg (168 lb 4.8 oz), SpO2 95 %.  General Examination:                                                                                                      HEENT-  Normocephalic/atraumatic.  Lungs- Respirations unlabored Extremities- No edema.   Neurological Examination Mental Status: On attempting to awaken from sleep after arrival in transfer [facility], the patient is wrapped from head to toe in blankets and difficult to arouse, awakening to a level of consciousness that is somnolent to drowsy state with slow responses to questions, slow and dysarthric speech and poor response to commands. He attempts to cover himself back up and go back to sleep repeatedly. He opens eyes briefly, follows some simple motor commands and is able to state that it is "Tuesday" and that he is in [place].  Cranial Nerves: Poorly compliant with exam. Briefly gazes at examiner and counts fingers. Closes eyes with attempts to visualize pupils, limited exam reveals equally reactive pupils. Gazes to right and left. Has difficulty with leftward gaze but crosses completely to left conjugately with repeated requests. No nystagmus. Reacts to facial touch bilaterally. Facial droop left lower quadrant is noted, which improves during the course of the exam but still present. Does not protrude tongue to request.  Motor: Poorly compliant with detailed exam. Slowly elevates bilateral upper extremities antigravity with no pronator drift x 5 seconds. Resists examiner briefly with 4+/5 strength BUE. Moves lower extremities equally with dorsiflexion, knee extension rated at 4+/5 and hip flexion 3/5 maximum strength elicitable. No asymmetry noted.  Sensory: Moves all 4 extremities to light touch. Cerebellar: Does not follow commands for cerebellar testing.  Gait: Deferred due to somnolence and falls risk concerns.    Lab Results: Basic Metabolic Panel:  Recent Labs Lab 09/02/16 1443  09/02/16 1715 09/03/16 0638  NA 137  --  138  K 3.3*  --  4.0  CL 104  --  106  CO2 22  --  25  GLUCOSE 125*  --  165*  BUN 16  --  13  CREATININE 1.06 0.89 1.06  CALCIUM 9.4  --  9.0  MG 2.0  --   --     Liver Function Tests:  Recent Labs Lab 09/02/16 1443 09/03/16 0638  AST 46* 38  ALT 31 28  ALKPHOS 54 55  BILITOT 0.9 1.2  PROT 6.7 6.3*  ALBUMIN 3.9 3.6   No results for input(s): LIPASE, AMYLASE in the last 168 hours. No results for input(s): AMMONIA in the last 168 hours.  CBC:  Recent Labs Lab 09/02/16 1443 09/02/16 1715 09/03/16 0638  WBC 6.3 5.7 4.5  NEUTROABS 3.6  --   --   HGB 17.0 16.2 16.3  HCT 46.4 43.9 45.9  MCV 90.4 88.9 90.2  PLT 188 196 192    Cardiac Enzymes: No results for input(s): CKTOTAL, CKMB, CKMBINDEX, TROPONINI in the last 168 hours.  Lipid Panel: No results for input(s): CHOL, TRIG, HDL, CHOLHDL, VLDL, LDLCALC in the last 168 hours.  CBG: No results for input(s): GLUCAP in the last 168 hours.  Microbiology: Results for orders placed or performed during the hospital encounter of 10/26/15  Urine culture     Status: None   Collection Time: 10/26/15  7:47 PM  Result Value Ref Range Status   Specimen Description URINE, RANDOM  Final   Special Requests Normal  Final   Culture NO GROWTH 2 DAYS  Final   Report Status 10/28/2015 FINAL  Final    Coagulation Studies:  Recent Labs  09/03/16 0638  LABPROT 13.5  INR 1.03    Imaging: No results found.  Assessment: 1. Admission for breakthrough seizure consistent of head movement and facial twitching. Most likely secondary to anticonvulsant noncompliance. Possible contributing factors include recent cocaine use, EtOH toxicity or EtOH withdrawal.  2. AMS with somnolent to drowsy state and left facial droop. Most likely postictal, but cannot rule out a possible small stroke based upon exam. Of note, LKN was > 6 hours previously given prior documentation at [facility] of being "sleepy and  not wanting to give a history, needs to be constantly awakened". Will obtain STAT CT head to rule out possible new hemorrhage or stroke.  3.  Possible alcohol withdrawal in the context of recent binge and history of EtOH abuse.  4. Cocaine abuse. Could result in ICH due to malignant HTN or stroke due to vasospasm.  5. History of right frontal AVM s/p coiling. The focal cortical damage at this location most likely is the underlying lesion responsible for his seizures.   Recommendations: 1. STAT CT head.  2. Was on baclofen 10 mg TID per EPIC. This medication is being restarted to prevent withdrawal seizures.  3. Continue CIWA protocol.  4. EEG in AM.  5. Continue Lamictal 100 mg po BID.  6. Reloaded Keppra due to current clinical appearance most consistent with postictal state. He may be having intermittent subclinical or unwitnessed seizures which would explain waxing and waning LOC on repeat documented exams since admission. Will continue at 500 mg BID. This may have been predisposing to behavioral problems per chart review. However, benefits for rapid seizure control in-house outweighs risks and this can be transitioned to an alternate medication or tapered off after  Lamictal has been administered for several days.   Electronically signed: Dr. Caryl Pina 09/04/2016, 2:15 AM

## 2016-09-05 DIAGNOSIS — Z91040 Latex allergy status: Secondary | ICD-10-CM

## 2016-09-05 DIAGNOSIS — Q282 Arteriovenous malformation of cerebral vessels: Secondary | ICD-10-CM

## 2016-09-05 DIAGNOSIS — F17210 Nicotine dependence, cigarettes, uncomplicated: Secondary | ICD-10-CM

## 2016-09-05 DIAGNOSIS — Z813 Family history of other psychoactive substance abuse and dependence: Secondary | ICD-10-CM

## 2016-09-05 DIAGNOSIS — Z79899 Other long term (current) drug therapy: Secondary | ICD-10-CM

## 2016-09-05 LAB — ETHANOL: Alcohol, Ethyl (B): <5 mg/dL (ref <5)

## 2016-09-05 LAB — BASIC METABOLIC PANEL
ANION GAP: 6 (ref 5–15)
BUN: 5 mg/dL — ABNORMAL LOW (ref 6–20)
CALCIUM: 8.8 mg/dL — AB (ref 8.9–10.3)
CO2: 27 mmol/L (ref 22–32)
CREATININE: 1.1 mg/dL (ref 0.61–1.24)
Chloride: 107 mmol/L (ref 101–111)
GFR calc Af Amer: >60 mL/min (ref >60)
GFR calc non Af Amer: >60 mL/min (ref >60)
Glucose, Bld: 142 mg/dL — ABNORMAL HIGH (ref 65–99)
Potassium: 3.5 mmol/L (ref 3.5–5.1)
Sodium: 140 mmol/L (ref 135–145)

## 2016-09-05 LAB — MAGNESIUM: Magnesium: 2.2 mg/dL (ref 1.7–2.4)

## 2016-09-05 MED ORDER — LORAZEPAM 1 MG PO TABS
1.0000 mg | ORAL_TABLET | Freq: Four times a day (QID) | ORAL | Status: DC | PRN
  Administered 2016-09-06 (×3): 1 mg via ORAL
  Filled 2016-09-05 (×3): qty 1

## 2016-09-05 MED ORDER — LEVETIRACETAM 500 MG PO TABS: 500.0000 mg | ORAL_TABLET | Freq: Two times a day (BID) | ORAL | Status: DC

## 2016-09-05 MED ORDER — MAGNESIUM SULFATE 4 GM/100ML IV SOLN: 4.0000 g | Freq: Once | INTRAVENOUS | Status: DC

## 2016-09-05 NOTE — Consult Note (Signed)
[facility] Face-to-Face Psychiatry Consult   Reason for Consult:  Polysubstance abuse, intermittent suicidal statements Referring Physician:  [facility] Patient Identification: Samuel Atkinson MRN:  [id number] Principal Diagnosis: Polysubstance abuse Diagnosis:   Patient Active Problem List   Diagnosis Date Noted  . Polysubstance abuse [F19.10]     Priority: High  . Alcohol withdrawal syndrome without complication (HCC) [F10.230]   . Benign essential HTN [I10] 09/03/2016  . Major depressive disorder [F32.9] 09/10/2014  . Cocaine abuse [F14.10] 09/10/2014  . Alcohol dependence (HCC) [F10.20] 09/10/2014  . Alcohol abuse [F10.10] 09/09/2014  . Substance induced mood disorder (HCC) [F19.94] 09/09/2014  . Cocaine use disorder, severe, dependence (HCC) [F14.20] 09/07/2014  . Suicidal ideation [R45.851]   . Seizures (HCC) [R56.9] 02/09/2013  . Cerebral AVM [Q28.2] 02/09/2013    Total Time spent with patient: 30 minutes  Subjective:   Samuel Atkinson is a 51 y.o. male patient admitted with reports of polysubstance abuse. Pt seen and chart reviewed. Pt is alert/oriented x4, calm, cooperative, and appropriate to situation. Pt denies suicidal/homicidal ideation and psychosis and does not appear to be responding to internal stimuli. Pt reports that he had an argument on the phone with his wife which made him very angry "about her doing some things while I'm in here and I couldn't do anything about it. Decisions we needed to make together about stuff." Pt reports that he made statements that "I'd be better off dead" after arguing with her today. Pt reports that he has no intention of harming himself and that he was upset with her at the time. Pt reports that he would like to go to a rehab facility and get clean if possible. Pt appears motivated and future-oriented.   HPI:  I have reviewed and concur with HPI elements below, modified as follows:  Per [facility] TTS notes prior to hospital admission: "Samuel Atkinson is a 51  y.o. male. He came in to the [facility] for evaluation. Patient was with wife. Wife provided all the information. Her reports indicated that patient has been out on the streets since Saturday drinking, using Cocaine & sleeping under a bridge. She says he has been drinking alcohol & using Cocaine for many years. He has been to several substance abuse programs without the ability to maintain sobriety. She says, Samuel Atkinson has other 5 brothers who were alcoholics/drug abusers. However, 3 of the brothers has passed but Samuel Atkinson & his twin brother. She reports that Samuel Atkinson has a hx of alcohol withdrawal seizures. 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 She concluded by saying that Samuel Atkinson has been warned by his doctor that any more intense drinking & drug use could worsen this condition. Samuel Atkinson presented with some twitchings to head & facial areas."  Pt went to [facility] and was subsequently admitted to the inpatient unit at [facility] for seizure management. He reportedly made statements about self-harm following an argument with his wife. Today on 09/05/16, pt seen above for evaluation.  Past Psychiatric History: polysubstance abuse, MDD  Risk to Self: Is patient at risk for suicide?: No Risk to Others:   Prior Inpatient Therapy:   Prior Outpatient Therapy:    Past Medical History:  Past Medical History:  Diagnosis Date  . AVM (arteriovenous malformation)   . Hx of substance abuse   . Hypertension   . Seizure disorder (HCC)   . Seizures (HCC)     Past Surgical History:  Procedure Laterality Date  . AVM Embolization  2013   Family History:  Family History  Problem Relation Age of Onset  . Hypertension Father   . Coronary artery disease Father   . Alcohol abuse Father   . Diabetes Mother   . Hypertension Mother   . Healthy Brother   . Coronary artery disease Brother   . Drug abuse Brother   . Drug abuse Brother   . Coronary artery  disease Brother    Family Psychiatric  History: denies Social History:  History  Alcohol Use  . 8.4 oz/week  . 8 Cans of beer, 6 Shots of liquor per week    Comment: Unsure of amount / pt. states he drank $150 worth ETOH in 2 days     History  Drug Use  . Frequency: 6.0 times per week  . Types: Cocaine    Comment: Unsure of amount /states $700 worth in 2 days    Social History   Social History  . Marital status: Single    Spouse name: Samuel Atkinson  . Number of children: 3  . Years of education: GED    Occupational History  . Unemployed    Social History Main Topics  . Smoking status: Current Every Day Smoker    Packs/day: 0.50    Types: Cigarettes  . Smokeless tobacco: Never Used  . Alcohol use 8.4 oz/week    8 Cans of beer, 6 Shots of liquor per week     Comment: Unsure of amount / pt. states he drank $150 worth ETOH in 2 days  . Drug use: Yes    Frequency: 6.0 times per week    Types: Cocaine     Comment: Unsure of amount /states $700 worth in 2 days  . Sexual activity: Yes    Birth control/ protection: None   Other Topics Concern  . None   Social History Narrative   Patient lives at home with Samuel Atkinson). Patient works full time time.   Education 12 th grade.   Caffeine 3-4 cups of caffeine daily.   Right handed.   Additional Social History:    Allergies:   Allergies  Allergen Reactions  . Latex Rash    Labs:  Results for orders placed or performed during the hospital encounter of 09/02/16 (from the past 48 hour(s))  Basic metabolic panel     Status: Abnormal   Collection Time: 09/04/16  8:57 AM  Result Value Ref Range   Sodium 137 135 - 145 mmol/L   Potassium 3.9 3.5 - 5.1 mmol/L   Chloride 105 101 - 111 mmol/L   CO2 26 22 - 32 mmol/L   Glucose, Bld 91 65 - 99 mg/dL   BUN 6 6 - 20 mg/dL   Creatinine, Ser 0.97 0.61 - 1.24 mg/dL   Calcium 8.6 (L) 8.9 - 10.3 mg/dL   GFR calc non Af Amer >60 >60 mL/min   GFR calc Af Amer >60 >60 mL/min    Comment:  (NOTE) The eGFR has been calculated using the CKD EPI equation. This calculation has not been validated in all clinical situations. eGFR's persistently <60 mL/min signify possible Chronic Kidney Disease.    Anion gap 6 5 - 15  Magnesium     Status: None   Collection Time: 09/04/16  8:57 AM  Result Value Ref Range   Magnesium 1.7 1.7 - 2.4 mg/dL  Basic metabolic panel     Status: Abnormal   Collection Time: 09/05/16  9:22 AM  Result Value Ref Range   Sodium 140 135 - 145 mmol/L   Potassium 3.5 3.5 - 5.1 mmol/L   Chloride 107 101 - 111 mmol/L   CO2 27 22 - 32 mmol/L   Glucose, Bld 142 (H) 65 - 99 mg/dL   BUN 5 (L) 6 - 20 mg/dL   Creatinine, Ser 1.10 0.61 - 1.24 mg/dL   Calcium 8.8 (L) 8.9 - 10.3 mg/dL   GFR calc non Af Amer >60 >60 mL/min   GFR calc Af Amer >60 >60 mL/min    Comment: (NOTE) The eGFR has been calculated using the CKD EPI equation. This calculation has not been validated in all clinical situations. eGFR's persistently <60 mL/min signify possible Chronic Kidney Disease.    Anion gap 6 5 - 15  Magnesium     Status: None   Collection Time: 09/05/16  9:22 AM  Result Value Ref Range   Magnesium 2.2 1.7 - 2.4 mg/dL  Ethanol     Status: None   Collection Time: 09/05/16  1:02 PM  Result Value Ref Range   Alcohol, Ethyl (B) <5 <5 mg/dL    Comment:        LOWEST DETECTABLE LIMIT FOR SERUM ALCOHOL IS 5 mg/dL FOR MEDICAL PURPOSES ONLY     Current Facility-Administered Medications  Medication Dose Route Frequency Provider Last Rate Last Dose  . acetaminophen (TYLENOL) tablet 650 mg  650 mg Oral Q6H PRN Debbe Odea, MD   650 mg at 09/04/16 2214   Or  . acetaminophen (TYLENOL) suppository 650 mg  650 mg Rectal Q6H PRN Debbe Odea, MD      . baclofen (LIORESAL) tablet 10 mg  10 mg Oral TID Kerney Elbe, MD   10 mg at 09/05/16 0849  . diclofenac sodium (VOLTAREN) 1 % transdermal gel 4 g  4 g Topical QID Debbe Odea, MD   4 g at 09/04/16 1822  . enoxaparin (LOVENOX)  injection 40 mg  40 mg Subcutaneous Q24H Debbe Odea, MD   40 mg at 09/04/16 2214  . folic acid (FOLVITE) tablet 1 mg  1 mg Oral Daily [place], PA-C   1 mg at 09/05/16 0849  . lamoTRIgine (LAMICTAL) tablet 100 mg  100 mg Oral BID Debbe Odea, MD   100 mg at 09/05/16 0849  . LORazepam (ATIVAN) injection 0-4 mg  0-4 mg Intravenous Q12H Debbe Odea, MD   1 mg at 09/05/16 0952  . LORazepam (ATIVAN) tablet 1 mg  1 mg Oral Q6H PRN Debbe Odea, MD   1 mg at 09/04/16 1436   Or  . LORazepam (ATIVAN) injection 1 mg  1 mg Intravenous Q6H PRN Debbe Odea, MD   1 mg at 09/05/16 0618  . multivitamin with minerals tablet 1 tablet  1 tablet Oral Daily [place], PA-C   1 tablet at 09/05/16 0849  . nicotine (NICODERM CQ - dosed in mg/24 hours) patch 21 mg  21 mg Transdermal Daily Irine Seal V, MD      . ondansetron [facility]) tablet 4 mg  4 mg Oral Q6H PRN Debbe Odea, MD       Or  . ondansetron (ZOFRAN) injection 4 mg  4 mg Intravenous Q6H PRN Debbe Odea, MD      . thiamine (VITAMIN B-1) tablet 100 mg  100 mg Oral Daily Emily [place], PA-C   100 mg at 09/05/16 0848    Musculoskeletal: Strength & Muscle Tone: within normal limits Gait & Station: normal Patient leans: N/A  Psychiatric Specialty Exam: Physical Exam  Review of Systems  Psychiatric/Behavioral: Positive for depression and substance abuse. Negative for hallucinations and suicidal ideas. The patient is nervous/anxious and has insomnia.   All other systems reviewed and are negative.   Blood pressure 110/68, pulse 83, temperature 97.8 F (36.6 C), temperature source Oral, resp. rate 17, height '6\' 1"'$  (1.854 m), weight 76.3 kg (168 lb 4.8 oz), SpO2 100 %.Body mass index is 22.2 kg/m.  General Appearance: Casual and Fairly Groomed  Eye Contact:  Good  Speech:  Clear and Coherent and Normal Rate  Volume:  Normal  Mood:  Anxious and Depressed  Affect:  Appropriate, Congruent and Depressed  Thought Process:  Coherent, Goal Directed,  Linear and Descriptions of Associations: Intact  Orientation:  Full (Time, Place, and Person)  Thought Content:  Focused on obtaining drug treatment  Suicidal Thoughts:  No  Homicidal Thoughts:  No  Memory:  Immediate;   Fair Recent;   Fair Remote;   Fair  Judgement:  Fair  Insight:  Fair  Psychomotor Activity:  Normal  Concentration:  Concentration: Fair and Attention Span: Fair  Recall:  [employer] of Knowledge:  Fair  Language:  Fair  Akathisia:  No  Handed:    AIMS (if indicated):     Assets:  Communication Skills Desire for Improvement Resilience Social Support  ADL's:  Intact  Cognition:  WNL  Sleep:      Treatment Plan Summary: Substance induced mood disorder ([place]) with MDD, stable for outpatient management but will greatly benefit from inpatient voluntary drug rehab   Disposition: No evidence of imminent risk to self or others at present.   Patient does not meet criteria for psychiatric inpatient admission. Supportive therapy provided about ongoing stressors. Refer to IOP. Discussed crisis plan, support from social network, calling 911, coming to the Emergency Department, and calling Suicide Hotline. SW consult to assist with drug abuse inpatient or outpatient resources  Benjamine Mola, [place] 09/05/2016 4:17 PM

## 2016-09-05 NOTE — Consult Note (Signed)
Subjective: He complains of HA and feels dizzy  Exam: Vitals:   09/04/16 1426 09/05/16 0457  BP: 122/67 110/68  Pulse: 89 83  Resp: 19 17  Temp: 98 F (36.7 C) 97.8 F (36.6 C)        Gen: In bed, NAD MS: looks lethargic, able to follow commands, appears under the influence.  CN:2-12 intact Motor: moving all extremities well with Sensory: intact Gait--shuffling steps and staggering, needng to hold onto wall to remain steady  Pertinent Labs/Diagnostics: none  Felicie MornDavid Smith PA-C [facility] [phone]  Impression: seizure in setting of medication noncompliance ETOH abuse. EEG normal. Concerned about his affect and new onset of appearance as if he is under influence. This could be secondary Keppra and Tegretol combination.    Recommendations: 1) UDS stat 2) ETOH level 3) Stop Keppra     09/05/2016, 12:23 PM

## 2016-09-05 NOTE — Progress Notes (Signed)
PROGRESS NOTE    Samuel Atkinson  WUJ:[id number] DOB: 01/02/1966 DOA: 09/02/2016 PCP: Irving CopasHACKER,ROBERT KELLER, MD    Brief Narrative:  Samuel Maplesavid Robinsonis a 51 y.o.malewith medical history significant of HTN, frontal AVM s/p coiling, seizure disorder, alcohol abuse, crack cocaine abuse and compliance issues due to substance abuse. He was sleepy and not wanting to give a history, needed to be constantly awakened. Patient told admitting physician that he had been binge drinking for days now and living in a car. He had been using "as much as he can get of crack cocaine which he specifies is a "couple hundred dollars worth" last used 1 day prior to admission. His last drink this AM around 11. Wife took him to behavioral med and he was then sent here for twitching of head and neck. He stated he has been using drugs and alcohol for 20 yrs.  Patient was admitted for seizures and transferred [facility] for further evaluation by neurology.    Assessment & Plan:   Principal Problem:   Seizures (HCC) Active Problems:   Cerebral AVM   Alcohol abuse   Substance induced mood disorder (HCC)   Major depressive disorder   Cocaine abuse   Benign essential HTN   Alcohol withdrawal syndrome without complication (HCC)   Polysubstance abuse  #1 breakthrough seizures Likely secondary to noncompliance to his anticonvulsant medications in the setting of polysubstance abuse of cocaine use, alcohol toxicity. Patient also with history of frontal AVM status post coiling versus embolization. Patient concerning the MRI has not been done of his brain to further assess AVM. Patient alert and following commands. No focal neurological deficits. No further seizures noted. EEG with no acute abnormalities. Head CT negative. Per neurology patient was noted to be somewhat lethargic with some  Imbalance and a such UDS and alcohol level have been ordered. Keppra has been discontinued due to possible interaction with  Lamictal. Patient was loaded with IV Keppra on admission. Continue Lamictal. We'll get a MRI of the brain with and without contrast for further evaluation of AVM and seizures on admission. Neurology following and appreciate input and recommendations.  #2 major depressive disorder Patient with a history of major depressive disorder. Patient with a flat affect. Patient also with some passive suicidal ideations. Family requesting psychiatric consultation. Psychiatric consultation pending.  #3 polysubstance abuse/cocaine abuse Family states patient wanting help for his polysubstance abuse. Family hoping patient when medically stable Be transferred to a rehabilitation facility from the hospital. Psych consultation pending. Also consult with social worker for further evaluation and recommendations.  #4 hypertension Stable.  #6 history of alcohol abuse Alcohol cessation. Continue the Ativan withdrawal protocol.  #7 tobacco abuse Tobacco cessation. Nicotine patch.    DVT prophylaxis: Lovenox Code Status: Full Family Communication: Updated patient. No family at bedside. Disposition Plan: Pending neurology and psychiatric evaluation.   Consultants:   Neurology: Dr.Lindzen 09/04/2016  Psychiatry pending  Procedures:   EEG 09/04/2016  CT head 09/04/2016  MRI pending  Antimicrobials:   None   Subjective: Patient with no further seizures noted. No chest pain. No shortness of breath. Patient alert sitting up on the side of the bed eating a sandwich. Patient concerned MRI head not done in evaluation due to history of AVM.  Objective: Vitals:   09/04/16 0503 09/04/16 1424 09/04/16 1426 09/05/16 0457  BP: 111/65 122/67 122/67 110/68  Pulse: 69 90 89 83  Resp: 18  19 17   Temp: 97.6 F (36.4 C)  98 F (  36.7 C) 97.8 F (36.6 C)  TempSrc: Oral  Oral Oral  SpO2: 96%  98% 100%  Weight:      Height:        Intake/Output Summary (Last 24 hours) at 09/05/16 1326 Last data filed  at 09/05/16 0456  Gross per 24 hour  Intake             1165 ml  Output                0 ml  Net             1165 ml   Filed Weights   09/02/16 1358 09/03/16 2334  Weight: 80.7 kg (178 lb) 76.3 kg (168 lb 4.8 oz)    Examination:  General exam: Appears calm and comfortable  Respiratory system: Clear to auscultation. Respiratory effort normal. Cardiovascular system: S1 & S2 heard, RRR. No JVD, murmurs, rubs, gallops or clicks. No pedal edema. Gastrointestinal system: Abdomen is nondistended, soft and nontender. No organomegaly or masses felt. Normal bowel sounds heard. Central nervous system: Alert and oriented. No focal neurological deficits. Extremities: Symmetric 5 x 5 power. Skin: No rashes, lesions or ulcers Psychiatry: Judgement and insight appear fair. Mood & affect flat.     Data Reviewed: I have personally reviewed following labs and imaging studies  CBC:  Recent Labs Lab 09/02/16 1443 09/02/16 1715 09/03/16 0638  WBC 6.3 5.7 4.5  NEUTROABS 3.6  --   --   HGB 17.0 16.2 16.3  HCT 46.4 43.9 45.9  MCV 90.4 88.9 90.2  PLT 188 196 192   Basic Metabolic Panel:  Recent Labs Lab 09/02/16 1443 09/02/16 1715 09/03/16 0638 09/04/16 0857 09/05/16 0922  NA 137  --  138 137 140  K 3.3*  --  4.0 3.9 3.5  CL 104  --  106 105 107  CO2 22  --  25 26 27   GLUCOSE 125*  --  165* 91 142*  BUN 16  --  13 6 5*  CREATININE 1.06 0.89 1.06 0.97 1.10  CALCIUM 9.4  --  9.0 8.6* 8.8*  MG 2.0  --   --  1.7 2.2   GFR: Estimated Creatinine Clearance: 85.7 mL/min (by C-G formula based on SCr of 1.1 mg/dL). Liver Function Tests:  Recent Labs Lab 09/02/16 1443 09/03/16 0638  AST 46* 38  ALT 31 28  ALKPHOS 54 55  BILITOT 0.9 1.2  PROT 6.7 6.3*  ALBUMIN 3.9 3.6   No results for input(s): LIPASE, AMYLASE in the last 168 hours. No results for input(s): AMMONIA in the last 168 hours. Coagulation Profile:  Recent Labs Lab 09/03/16 0638  INR 1.03   Cardiac Enzymes: No  results for input(s): CKTOTAL, CKMB, CKMBINDEX, TROPONINI in the last 168 hours. BNP (last 3 results) No results for input(s): PROBNP in the last 8760 hours. HbA1C: No results for input(s): HGBA1C in the last 72 hours. CBG: No results for input(s): GLUCAP in the last 168 hours. Lipid Profile: No results for input(s): CHOL, HDL, LDLCALC, TRIG, CHOLHDL, LDLDIRECT in the last 72 hours. Thyroid Function Tests: No results for input(s): TSH, T4TOTAL, FREET4, T3FREE, THYROIDAB in the last 72 hours. Anemia Panel: No results for input(s): VITAMINB12, FOLATE, FERRITIN, TIBC, IRON, RETICCTPCT in the last 72 hours. Sepsis Labs: No results for input(s): PROCALCITON, LATICACIDVEN in the last 168 hours.  No results found for this or any previous visit (from the past 240 hour(s)).       Radiology Studies:  Ct Head Wo Contrast  Result Date: 09/04/2016 CLINICAL DATA:  Initial evaluation for breakthrough seizure, altered mental status, left facial droop. Evaluate for possible stroke. EXAM: CT HEAD WITHOUT CONTRAST TECHNIQUE: Contiguous axial images were obtained from the base of the skull through the vertex without intravenous contrast. COMPARISON:  Prior CT from 07/28/2014. FINDINGS: Brain: Extensive streak artifact from treated AVM at the parasagittal anterior right frontal lobe mildly limits evaluation. 3 cerebral volume within normal limits. No evidence for acute intracranial hemorrhage. No findings to suggest acute large vessel territory infarct. No mass lesion, midline shift or mass effect. No hydrocephalus. No extra-axial fluid collection. Vascular: No hyperdense vessel. Skull: Scalp soft tissues demonstrate no acute abnormality. Calvarium intact. Sinuses/Orbits: Globes and orbital soft tissues within normal limits. Paranasal sinuses are clear. The no mastoid effusion. Other: No other significant finding. IMPRESSION: 1. No acute intracranial infarct or other process identified. 2. Sequela prior coil  embolization for right frontal AVM. Electronically Signed   By: Rise Mu M.D.   On: 09/04/2016 05:18        Scheduled Meds: . baclofen  10 mg Oral TID  . diclofenac sodium  4 g Topical QID  . enoxaparin (LOVENOX) injection  40 mg Subcutaneous Q24H  . folic acid  1 mg Oral Daily  . lamoTRIgine  100 mg Oral BID  . LORazepam  0-4 mg Intravenous Q12H  . multivitamin with minerals  1 tablet Oral Daily  . nicotine  21 mg Transdermal Daily  . thiamine  100 mg Oral Daily   Continuous Infusions:   LOS: 3 days    Time spent: 35 minutes    Shyleigh Daughtry, MD [facility] Pager [phone]  If 7PM-7AM, please contact night-coverage [email or web address] Password [email or web address] 09/05/2016, 1:26 PM

## 2016-09-05 NOTE — Progress Notes (Signed)
Pt requesting more "sleep" medication referring to ativan. Pt not exhibiting signs of withdrawal at this time. Pt states he needs meds because he is "pissed off at his wife for taking his money from his account. Informed pt of protocol for ativan. Redirected pt and offered juice and ice with pt acceptance.

## 2016-09-06 ENCOUNTER — Inpatient Hospital Stay (HOSPITAL_COMMUNITY): Payer: Federal, State, Local not specified - PPO

## 2016-09-06 DIAGNOSIS — F1994 Other psychoactive substance use, unspecified with psychoactive substance-induced mood disorder: Secondary | ICD-10-CM

## 2016-09-06 LAB — CBC
HEMATOCRIT: 45 % (ref 39.0–52.0)
Hemoglobin: 15.4 g/dL (ref 13.0–17.0)
MCH: 32.4 pg (ref 26.0–34.0)
MCHC: 34.2 g/dL (ref 30.0–36.0)
MCV: 94.5 fL (ref 78.0–100.0)
Platelets: 188 10*3/uL (ref 150–400)
RBC: 4.76 MIL/uL (ref 4.22–5.81)
RDW: 12.6 % (ref 11.5–15.5)
WBC: 4.4 10*3/uL (ref 4.0–10.5)

## 2016-09-06 LAB — BASIC METABOLIC PANEL
Anion gap: 11 (ref 5–15)
BUN: 7 mg/dL (ref 6–20)
CHLORIDE: 101 mmol/L (ref 101–111)
CO2: 26 mmol/L (ref 22–32)
Calcium: 8.9 mg/dL (ref 8.9–10.3)
Creatinine, Ser: 1.08 mg/dL (ref 0.61–1.24)
GFR calc Af Amer: >60 mL/min (ref >60)
GLUCOSE: 103 mg/dL — AB (ref 65–99)
POTASSIUM: 3.9 mmol/L (ref 3.5–5.1)
Sodium: 138 mmol/L (ref 135–145)

## 2016-09-06 LAB — RAPID URINE DRUG SCREEN, HOSP PERFORMED
AMPHETAMINES: NOT DETECTED
BARBITURATES: NOT DETECTED
BENZODIAZEPINES: POSITIVE — AB
COCAINE: NOT DETECTED
Opiates: NOT DETECTED
Tetrahydrocannabinol: NOT DETECTED

## 2016-09-06 LAB — MAGNESIUM: Magnesium: 2 mg/dL (ref 1.7–2.4)

## 2016-09-06 MED ORDER — TIOTROPIUM BROMIDE MONOHYDRATE 18 MCG IN CAPS: 18.0000 ug | ORAL_CAPSULE | Freq: Every day | RESPIRATORY_TRACT | 0 refills | Status: DC

## 2016-09-06 MED ORDER — LAMOTRIGINE 100 MG PO TABS: 100.0000 mg | ORAL_TABLET | Freq: Two times a day (BID) | ORAL | 0 refills | Status: DC

## 2016-09-06 MED ORDER — FOLIC ACID 1 MG PO TABS
1.0000 mg | ORAL_TABLET | Freq: Every day | ORAL | Status: DC
Start: 2016-09-07 — End: 2017-02-12

## 2016-09-06 MED ORDER — BUDESONIDE-FORMOTEROL FUMARATE 160-4.5 MCG/ACT IN AERO: 2.0000 | INHALATION_SPRAY | Freq: Two times a day (BID) | RESPIRATORY_TRACT | 12 refills | Status: DC

## 2016-09-06 MED ORDER — THIAMINE HCL 100 MG PO TABS: 100.0000 mg | ORAL_TABLET | Freq: Every day | ORAL | Status: DC

## 2016-09-06 MED ORDER — NICOTINE 21 MG/24HR TD PT24: 21.0000 mg | MEDICATED_PATCH | Freq: Every day | TRANSDERMAL | 0 refills | Status: DC

## 2016-09-06 MED ORDER — GADOBENATE DIMEGLUMINE 529 MG/ML IV SOLN
15.0000 mL | Freq: Once | INTRAVENOUS | Status: AC
  Administered 2016-09-06: 15 mL via INTRAVENOUS

## 2016-09-06 NOTE — Progress Notes (Signed)
1720: Upon Pt's request wanting IV out now and wants to know when he will be DC'd. Pt very anxious and irritable. Pacing in and out of room, walking to nurses desk to use phone. Says he wants to leave now. MD notified.   1732: MD put in DC orders   1820: Pt given discharge instructions, prescriptions, and care notes. Pt verbalized understanding AEB no further questions or concerns at this time. IV was discontinued, no redness, pain, or swelling noted at this time. Pt refused a wheelchair and left the floor.

## 2016-09-06 NOTE — Discharge Summary (Signed)
Physician Discharge Summary  Samuel Atkinson ZOX:[id number] DOB: April 22, 1966 DOA: 09/02/2016  PCP: [name], MD  Admit date: 09/02/2016 Discharge date: 09/06/2016  Time spent: 65 minutes  Recommendations for Outpatient Follow-up:  1. Follow-up with [name], MD in 2 weeks. On follow-up patient's depression needs to be reassessed and may consider outpatient referral. Patient needs a basic metabolic profile done to follow-up on electrolytes and renal function as well as a CBC done. 2. Follow-up with Dr. Anne Hahn of neurology 2 weeks.   Discharge Diagnoses:  Principal Problem:   Substance induced mood disorder (HCC) Active Problems:   Seizures (HCC)   Cerebral AVM   Alcohol abuse   Major depressive disorder   Cocaine abuse   Benign essential HTN   Alcohol withdrawal syndrome without complication (HCC)   Polysubstance abuse   Discharge Condition: Stable and improved  Diet recommendation: Regular  Filed Weights   09/02/16 1358 09/03/16 2334  Weight: 80.7 kg (178 lb) 76.3 kg (168 lb 4.8 oz)    History of present illness:  Per Dr Eulogio Bear is a 51 y.o. male with medical history significant of HTN, frontal AVM s/p coiling, seizure disorder, alcohol abuse, crack cocaine abuse and compliance issues due to substance abuse. He was sleepy and not want to give a history, needed to be constantly awakened. Patient told admitting physician, he had been binge drinking for days now and living in a car. He had been using "as much as he can get of crack cocaine which he specifies is a "couple hundred dollars worth" last used yesterday. His last drink this AM around 11. Wife took him to behavioral med and he was then sent to ED for twitching of head and neck. He stated he had been using drugs and alcohol for 20 yrs.   ED Course: given a dose of IV Ativan a few hrs ago for the twitching. Alcohol level 45.    Hospital Course:  #1 breakthrough seizures Likely  secondary to noncompliance to his anticonvulsant medications in the setting of polysubstance abuse of cocaine use, alcohol toxicity. Patient did state that had intake and his Lamictal in approximately 2 weeks. Patient also with history of frontal AVM status post coiling versus embolization. Patient concerned that MRI hadf not been done of his brain to further assess AVM. Patient alert and following commands. No focal neurological deficits. No further seizures noted. EEG with no acute abnormalities. Head CT negative. Per neurology patient was noted to be somewhat lethargic with some  Imbalance and a such UDS and alcohol level were ordered which are unremarkable. Patient's Keppra was discontinued due to possible interaction with Lamictal. Patient was loaded with IV Keppra on admission. Patient was also maintained on Lamictal. Patient was seen by neurology throughout the hospitalization and followed. MRI of the brain was done which was negative for any acute abnormalities with satisfactory postreduction appearance of anterior right frontal lobe AVM. Mild regional encephalomalacia malacia and gliosis. Patient remained in stable condition did not have any further seizures throughout the hospitalization be discharged home in stable and improved condition. Patient will be discharged on his home regimen of Lamictal and is to follow-up with his neurologist in 2 weeks.   #2 major depressive disorder/substance induced mood disorder Patient with a history of major depressive disorder. Patient with a flat affect. Patient also with some passive suicidal ideations. Patient seen in consultation by psychiatry who had recommended outpatient management.   #3 polysubstance abuse/cocaine abuse Family stated patient wanted help for  his polysubstance abuse. Family hoping patient when medically stable Be transferred to a rehabilitation facility from the hospital. Patient has been seen in consultation by psychiatry who felt patient  was not an imminent risk to self or others and did not meet criteria for psychiatric inpatient admission. It was recommended that she be referred for outpatient drug rehabilitation management. Social work was consulted to supply patient with outpatient resources.  #4 hypertension Remained stable throughout the hospitalization.   #6 history of alcohol abuse Alcohol cessation. Patient was placed on the Ativan withdrawal protocol. Patient did not go through any DTs during the hospitalization and remained in stable condition.   #7 tobacco abuse Tobacco cessation. Nicotine patch placed..   Procedures:  EEG 09/04/2016  CT head 09/04/2016  MRI brain 09/06/2016  Consultations:  Neurology: Dr.Lindzen 09/04/2016  Psychiatry [name] Haw, FNP 09/05/2016  Discharge Exam: Vitals:   09/06/16 0007 09/06/16 0540  BP: (!) 112/59 119/63  Pulse: 70 71  Resp: 16 18  Temp: 98 F (36.7 C) 97.8 F (36.6 C)    General: NAD Cardiovascular: RRR Respiratory: CTAB  Discharge Instructions   Discharge Instructions    Diet general    Complete by:  As directed    Discharge instructions    Complete by:  As directed    PLEASE TAKE SEIZURE MEDICATIONS AS PRESCRIBED.   Increase activity slowly    Complete by:  As directed      Current Discharge Medication List    START taking these medications   Details  budesonide-formoterol (SYMBICORT) 160-4.5 MCG/ACT inhaler Inhale 2 puffs into the lungs 2 (two) times daily. Qty: 1 Inhaler, Refills: 12    folic acid (FOLVITE) 1 MG tablet Take 1 tablet (1 mg total) by mouth daily.    nicotine (NICODERM CQ - DOSED IN MG/24 HOURS) 21 mg/24hr patch Place 1 patch (21 mg total) onto the skin daily. Qty: 28 patch, Refills: 0    !! thiamine 100 MG tablet Take 1 tablet (100 mg total) by mouth daily.     !! - Potential duplicate medications found. Please discuss with provider.    CONTINUE these medications which have CHANGED   Details  lamoTRIgine  (LAMICTAL) 100 MG tablet Take 1 tablet (100 mg total) by mouth 2 (two) times daily. Qty: 60 tablet, Refills: 0    tiotropium (SPIRIVA) 18 MCG inhalation capsule Place 1 capsule (18 mcg total) into inhaler and inhale daily. Qty: 30 capsule, Refills: 0      CONTINUE these medications which have NOT CHANGED   Details  amLODipine (NORVASC) 10 MG tablet Take 1 tablet (10 mg total) by mouth daily. Qty: 30 tablet, Refills: 0    baclofen (LIORESAL) 10 MG tablet TK 1 T PO  WITH FOOD OR MILK TID. Refills: 0    albuterol (PROVENTIL HFA;VENTOLIN HFA) 108 (90 BASE) MCG/ACT inhaler Inhale 1-2 puffs into the lungs every 6 (six) hours as needed for wheezing or shortness of breath.    Multiple Vitamin (MULTIVITAMIN WITH MINERALS) TABS tablet Take 1 tablet by mouth daily. Qty: 30 tablet, Refills: 0    !! thiamine 100 MG tablet Take 1 tablet (100 mg total) by mouth daily. Qty: 30 tablet, Refills: 0     !! - Potential duplicate medications found. Please discuss with provider.    STOP taking these medications     B Complex-Biotin-FA (SUPER B-50 COMPLEX) CAPS      Calcium-Magnesium (CALCIUM MAGNESIUM 750) 300-300 MG TABS      diclofenac (CATAFLAM)  50 MG tablet      metaxalone (SKELAXIN) 800 MG tablet        Allergies  Allergen Reactions  . Latex Rash   Follow-up Information    Lesly DukesWILLIS,CHARLES KEITH, MD. Schedule an appointment as soon as possible for a visit in 2 week(s).   Specialty:  Neurology Contact information: [address] [place] [address] [phone]        Irving CopasHACKER,ROBERT KELLER, MD. Schedule an appointment as soon as possible for a visit in 2 week(s).   Specialty:  Family Medicine Contact information: [address] [place] [address] [phone]        OUTPATIENT DRUG REHAB Follow up.   Why:  PLEASE FOLLOW UP WITH OUTPATIENT DRUG REHABILITATION.           The results of significant diagnostics from this hospitalization  (including imaging, microbiology, ancillary and laboratory) are listed below for reference.    Significant Diagnostic Studies: Ct Head Wo Contrast  Result Date: 09/04/2016 CLINICAL DATA:  Initial evaluation for breakthrough seizure, altered mental status, left facial droop. Evaluate for possible stroke. EXAM: CT HEAD WITHOUT CONTRAST TECHNIQUE: Contiguous axial images were obtained from the base of the skull through the vertex without intravenous contrast. COMPARISON:  Prior CT from 07/28/2014. FINDINGS: Brain: Extensive streak artifact from treated AVM at the parasagittal anterior right frontal lobe mildly limits evaluation. 3 cerebral volume within normal limits. No evidence for acute intracranial hemorrhage. No findings to suggest acute large vessel territory infarct. No mass lesion, midline shift or mass effect. No hydrocephalus. No extra-axial fluid collection. Vascular: No hyperdense vessel. Skull: Scalp soft tissues demonstrate no acute abnormality. Calvarium intact. Sinuses/Orbits: Globes and orbital soft tissues within normal limits. Paranasal sinuses are clear. The no mastoid effusion. Other: No other significant finding. IMPRESSION: 1. No acute intracranial infarct or other process identified. 2. Sequela prior coil embolization for right frontal AVM. Electronically Signed   By: Rise MuBenjamin  McClintock M.D.   On: 09/04/2016 05:18   Mr Laqueta JeanBrain W VHWo Contrast  Result Date: 09/06/2016 CLINICAL DATA:  51 year old male with history of treated frontal lobe AVM. Seizures. Substance abuse. EXAM: MRI HEAD WITHOUT AND WITH CONTRAST TECHNIQUE: Multiplanar, multiecho pulse sequences of the brain and surrounding structures were obtained without and with intravenous contrast. CONTRAST:  15mL MULTIHANCE GADOBENATE DIMEGLUMINE 529 MG/ML IV SOLN COMPARISON:  Head CTs without contrast 09/14/2016 and earlier. FINDINGS: Brain: Mild susceptibility artifact along the anteromedial right frontal lobe at the site of the treated  AVM. No restricted diffusion to suggest acute infarction. No midline shift, mass effect, evidence of mass lesion, ventriculomegaly, extra-axial collection or acute intracranial hemorrhage. Cervicomedullary junction and pituitary are within normal limits. Mild cortical encephalomalacia and gliosis along the posterior margin of the anteromedial right frontal lobe vascular malformation. No mass effect. No abnormal enhancement identified. Elsewhere gray and white matter signal is normal. No dural thickening. The hippocampal formations appear fairly symmetric and within normal limits. No other cortical encephalomalacia. Vascular: Major intracranial vascular flow voids are preserved, the distal left vertebral artery appears dominant. 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 Skull and upper cervical spine: Negative. Normal bone marrow signal. Sinuses/Orbits: Normal orbits soft tissues. Visualized paranasal sinuses and mastoids are stable and well pneumatized. Other: Visible internal auditory structures appear normal. Negative scalp soft tissues. IMPRESSION: 1.  No acute  intracranial abnormality. 2. Satisfactory post treatment appearance of the anterior right frontal lobe AVM. Mild regional encephalomalacia and gliosis. 3. Normal MRI appearance of the brain elsewhere. Electronically Signed   By: Odessa Fleming M.D.   On: 09/06/2016 12:55    Microbiology: No results found for this or any previous visit (from the past 240 hour(s)).   Labs: Basic Metabolic Panel:  Recent Labs Lab 09/02/16 1443 09/02/16 1715 09/03/16 1610 09/04/16 0857 09/05/16 0922 09/06/16 0558  NA 137  --  138 137 140 138  K 3.3*  --  4.0 3.9 3.5 3.9  CL 104  --  106 105 107 101  CO2 22  --  25 26 27 26   GLUCOSE 125*  --  165* 91 142* 103*  BUN 16  --  13 6 5* 7  CREATININE 1.06 0.89 1.06  0.97 1.10 1.08  CALCIUM 9.4  --  9.0 8.6* 8.8* 8.9  MG 2.0  --   --  1.7 2.2 2.0   Liver Function Tests:  Recent Labs Lab 09/02/16 1443 09/03/16 0638  AST 46* 38  ALT 31 28  ALKPHOS 54 55  BILITOT 0.9 1.2  PROT 6.7 6.3*  ALBUMIN 3.9 3.6   No results for input(s): LIPASE, AMYLASE in the last 168 hours. No results for input(s): AMMONIA in the last 168 hours. CBC:  Recent Labs Lab 09/02/16 1443 09/02/16 1715 09/03/16 0638 09/06/16 0558  WBC 6.3 5.7 4.5 4.4  NEUTROABS 3.6  --   --   --   HGB 17.0 16.2 16.3 15.4  HCT 46.4 43.9 45.9 45.0  MCV 90.4 88.9 90.2 94.5  PLT 188 196 192 188   Cardiac Enzymes: No results for input(s): CKTOTAL, CKMB, CKMBINDEX, TROPONINI in the last 168 hours. BNP: BNP (last 3 results) No results for input(s): BNP in the last 8760 hours.  ProBNP (last 3 results) No results for input(s): PROBNP in the last 8760 hours.  CBG: No results for input(s): GLUCAP in the last 168 hours.     SignedRamiro Harvest MD.  [facility] 09/06/2016, 5:34 PM

## 2016-09-09 ENCOUNTER — Encounter: Payer: Self-pay | Admitting: Neurology

## 2016-09-09 NOTE — Telephone Encounter (Signed)
Pt's wife called to advise he has been accepted at [facility] and needs a letter documenting he is taking Lamictal for seizures not for mood alternating. Please mail to the home address (address was verified)

## 2016-09-09 NOTE — Telephone Encounter (Signed)
I will dictate a letter. 

## 2016-09-10 NOTE — Telephone Encounter (Signed)
Mailed letter to pt

## 2016-09-24 ENCOUNTER — Encounter: Payer: Self-pay | Admitting: Neurology

## 2016-09-24 NOTE — Telephone Encounter (Signed)
I called patient. The patient will be entering a drug rehabilitation program, apparently the program will not allow him to remain on Lamictal while active in the program.  I am not sure I exactly understand this issue, the Lamictal is necessary for control of seizures, it is not being used for psychiatric purposes.  I have agreed to date a letter indicating that I have recommended that he remain on Lamictal, but the patient has the intention of stopping Lamictal so that he can enter the drug rehabilitation program.  A letter has been dictated.

## 2016-09-24 NOTE — Telephone Encounter (Signed)
Patients wife called office in reference to patient being accepted to 4 month program.  Patients wife states they need a letter stating patient if possible is able to be winged off Lamictal, due to patient not being able to take while at the program.  Please call

## 2016-09-25 ENCOUNTER — Ambulatory Visit (INDEPENDENT_AMBULATORY_CARE_PROVIDER_SITE_OTHER): Payer: Federal, State, Local not specified - PPO | Admitting: Psychology

## 2016-09-25 ENCOUNTER — Encounter: Payer: Self-pay | Admitting: Psychology

## 2016-09-25 DIAGNOSIS — F339 Major depressive disorder, recurrent, unspecified: Secondary | ICD-10-CM

## 2016-09-25 DIAGNOSIS — R413 Other amnesia: Secondary | ICD-10-CM | POA: Diagnosis not present

## 2016-09-25 DIAGNOSIS — R569 Unspecified convulsions: Secondary | ICD-10-CM

## 2016-09-25 DIAGNOSIS — F1910 Other psychoactive substance abuse, uncomplicated: Secondary | ICD-10-CM

## 2016-09-25 DIAGNOSIS — Q282 Arteriovenous malformation of cerebral vessels: Secondary | ICD-10-CM

## 2016-09-25 NOTE — Progress Notes (Signed)
NEUROPSYCHOLOGICAL INTERVIEW (CPT: [id number])  Name: Samuel Atkinson Date of Birth: 05/07/1966 Date of Interview: 09/25/2016  Reason for Referral:  Samuel Atkinson is a 51 y.o. right handed male who is referred for neuropsychological evaluation by Dr. Anne HahnWillis of [facility] due to concerns about memory loss. This patient is accompanied in the office by his wife who supplements the history.  History of Presenting Problem:  Samuel Atkinson, who has a history of severe depression and polysubstance abuse, attempted suicide via drug overdose in August 2013 and while in the ER had a seizure. (This was in [place] MRI reportedly showed an abnormality so angiogram was completed and confirmed frontal AVM. He was apparently "on life support for 8 days" and underwent two embolizations. He underwent physical and occupational therapy but did not have any cognitive testing. Samuel Atkinson notes that he had history of seizures prior to his 01/2012 hospitalization but no neurologic workup had been completed because these were attributed to substance abuse. After the AVM embolizations in 2013, he was sober for 1 1/2 years. This was the longest period of sobriety in a very long time. (He started abusing alcohol at age 51 and started abusing cocaine at age 51. He denied other substance abuse.) He has relapsed several times since then, most recently in early March 2018. He was hospitalized from 3/6-3/03/2017. He is currently followed by Dr. Anne HahnWillis and is prescribed Lamictal. He is awaiting admission to an inpatient drug treatment program (should be within the next 4 weeks). He has completed similar programs in the past. He has never seen a psychiatrist, psychologist or other mental health practitioner regularly in the past. He did go to a marriage counseling session one time with his wife. Otherwise he has had no outpatient treatment. His wife reported that she was going to schedule him to see a psychiatrist but  they required a $100 deposit which the patient did not have.  The patient and his wife denied any cognitive concerns prior to the hospitalization and AVM repair in 01/2012. His wife stated "he was a different person after that". She reported that he became more irritable, socially withdrawn and forgetful. She thinks his cognitive problems have remained relatively stable since 01/2012. She reported he frequently misplaces and loses things, and changes conversation topics without warning. She reported he forgets recent conversations, and repeats questions multiple times. The patient and his wife agree that he has difficulty concentrating, starts but doesn't always finish projects, sometimes has difficulty initiating tasks (procrastinates), is distractible, and is disorganized. His wife thinks he has poor time management, and she notes that in order for him to get to an 8 am appointment he has to wake up at 4 am. They deny any difficulty with word finding or comprehension. They did endorse reduced spelling abilities. He is not currently driving but when he does, he has to use GPS because he is not familiar with the area (despite living here for 4 years).   Samuel Atkinson is not currently employed. 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 The patient manages his medications independently. He and his wife agree he is doing fine with this right now because he is home and not working, but as soon as he starts working he will have difficulty remembering to take his medications. His last seizure was about a month ago, per his wife. His wife manages the finances and appointments. He does some cooking but  does forget to turn off the oven/stove.  The patient describes his current mood as "fine". He denies depressed mood. When he does not take his medication, he is much more irritable. His wife reports he appears more anxious about social situations. He reported  that he has a longstanding habit of early morning awakening (2:30 or 3 am) but takes naps as needed during the day. He does not feel his energy level is low. His appetite is good. He denied suicidal ideation. He denied hallucinations. He has had hallucinations in the past in the context of substance abuse.  He denied history of brain injury.  MRI of the brain completed on 09/06/2016 reportedly revealed: 1.  No acute intracranial abnormality. 2. Satisfactory post treatment appearance of the anterior right frontal lobe AVM. Mild regional encephalomalacia and gliosis. 3. Normal MRI appearance of the brain elsewhere.  Family history is significant for substance abuse/dependence in multiple family members (father, all 5 brothers). He is a twin, and his twin brother is the only brother out of five that is still living. His twin brother is 30 years sober.    Social History: Education: GED (left high school in [place] in 11th grade, got GED in 2009) -- denied any significant learning problems, was involved in band, [employer] student. But did repeat a grade in school "but that was over a girl" -- think was 7th or 9th grade  Occupational history: Currently unemployed. Previously: Public affairs consultant, [employer] jobs, drove forklifts, Naval architect jobs, Holiday representative, Air traffic controller. Marital history: Married to his current wife x4 years. Previously married and divorced once before.  3 children (1 by first wife, 2 from another relationship). He reports positive relationships with his children -- ages 83, 50, 58. Alcohol/Tobacco/Substances: History of alcohol and cocaine abuse discussed above. Currently not using alcohol or any other drugs. Currently smokes about 1 ppd of cigarettes.   Medical History: Past Medical History:  Diagnosis Date  . AVM (arteriovenous malformation)   . Hx of substance abuse   . Hypertension   . Seizure disorder (HCC)   . Seizures (HCC)      Current Medications:  Outpatient Encounter  Prescriptions as of 09/25/2016  Medication Sig  . albuterol (PROVENTIL HFA;VENTOLIN HFA) 108 (90 BASE) MCG/ACT inhaler Inhale 1-2 puffs into the lungs every 6 (six) hours as needed for wheezing or shortness of breath.  Marland Kitchen amLODipine (NORVASC) 10 MG tablet Take 1 tablet (10 mg total) by mouth daily.  . baclofen (LIORESAL) 10 MG tablet TK 1 T PO  WITH FOOD OR MILK TID.  Marland Kitchen budesonide-formoterol (SYMBICORT) 160-4.5 MCG/ACT inhaler Inhale 2 puffs into the lungs 2 (two) times daily.  . folic acid (FOLVITE) 1 MG tablet Take 1 tablet (1 mg total) by mouth daily.  Marland Kitchen lamoTRIgine (LAMICTAL) 100 MG tablet Take 1 tablet (100 mg total) by mouth 2 (two) times daily.  . Multiple Vitamin (MULTIVITAMIN WITH MINERALS) TABS tablet Take 1 tablet by mouth daily. (Patient not taking: Reported on 09/02/2016)  . nicotine (NICODERM CQ - DOSED IN MG/24 HOURS) 21 mg/24hr patch Place 1 patch (21 mg total) onto the skin daily.  Marland Kitchen thiamine 100 MG tablet Take 1 tablet (100 mg total) by mouth daily. (Patient not taking: Reported on 09/02/2016)  . thiamine 100 MG tablet Take 1 tablet (100 mg total) by mouth daily.  Marland Kitchen tiotropium (SPIRIVA) 18 MCG inhalation capsule Place 1 capsule (18 mcg total) into inhaler and inhale daily.   No facility-administered encounter medications on file as of 09/25/2016.  Behavioral Observations:   Appearance: Neatly and and appropriately dressed and groomed Gait: Ambulated independently, no abnormalities observed Speech: Fluent; normal rate, rhythm and volume Thought process: Linear, goal directed Affect: Full, generally euthymic Interpersonal: Pleasant, appropriate, interactive and engaged   TESTING: There is medical necessity to proceed with neuropsychological assessment as the results will be used to aid in differential diagnosis and clinical decision-making and to inform specific treatment recommendations. Per the patient, his wife and medical records reviewed, there has been a change in  cognitive functioning since he underwent frontal lobe AVM repair in 2013. He also has a history of substance abuse and seizures which also could be affecting cognitive functioning.   PLAN: The patient will return for a full battery of neuropsychological testing with a psychometrician under my supervision. Education regarding testing procedures was provided. Subsequently, the patient will see this provider for a follow-up session at which time his test performances and my impressions and treatment recommendations will be reviewed in detail.   Full neuropsychological evaluation report to follow.

## 2016-10-07 ENCOUNTER — Ambulatory Visit (INDEPENDENT_AMBULATORY_CARE_PROVIDER_SITE_OTHER): Payer: Federal, State, Local not specified - PPO | Admitting: Psychology

## 2016-10-07 DIAGNOSIS — G40909 Epilepsy, unspecified, not intractable, without status epilepticus: Secondary | ICD-10-CM

## 2016-10-07 DIAGNOSIS — R413 Other amnesia: Secondary | ICD-10-CM

## 2016-10-07 DIAGNOSIS — Q282 Arteriovenous malformation of cerebral vessels: Secondary | ICD-10-CM

## 2016-10-07 NOTE — Progress Notes (Signed)
   Neuropsychology Note  Samuel Atkinson returned today for 3 hours of neuropsychological testing with technician, Wallace Keller, BS, under the supervision of Dr. Elvis Coil. The patient did not appear overtly distressed by the testing session, per behavioral observation or via self-report to the technician. Rest breaks were offered. Samuel Atkinson will return within 2 weeks for a feedback session with Dr. Alinda Dooms at which time his test performances, clinical impressions and treatment recommendations will be reviewed in detail. The patient understands he can contact our office should he require our assistance before this time.  Full report to follow.

## 2016-10-13 NOTE — Progress Notes (Signed)
NEUROPSYCHOLOGICAL EVALUATION   Crews Mccollamvid Griebel  Date of Birth:   May 11, 1966 Date of Interview:  09/25/2016 Date of Testing:  10/07/2016   Date of Feedback:  4/17/'2018       Background Information:  Reason for Referral:  Samuel Atkinson is a 51 y.o. right handed male referred by Dr. Marlan Palau to assess his current level of cognitive functioning and assist in differential diagnosis. The current evaluation consisted of a review of available medical records, an interview with the patient and his wife, and the completion of a neuropsychological testing battery. Informed consent was obtained.  History of Presenting Problem:  Mr. Schubach, who has a history of severe depression and polysubstance abuse, attempted suicide via drug overdose in August 2013 and while in the ER had a seizure. This was in [place]. MRI reportedly showed an abnormality so angiogram was completed and confirmed frontal AVM. He was apparently "on life support for 8 days" and underwent two embolizations. He underwent physical and occupational therapy but did not have any cognitive testing. Mr. Korb notes that he had history of seizures prior to his 01/2012 hospitalization but no neurologic workup had been completed because these were attributed to substance abuse. After the AVM embolizations in 2013, he was sober for 1 1/2 years. This was the longest period of sobriety in a very long time. (He started abusing alcohol at age 49 and started abusing cocaine at age 12. He denied other substance abuse.) He has relapsed several times since then, most recently in early March 2018. He was hospitalized from 3/6-3/03/2017. He is currently followed by Dr. Anne Hahn and is prescribed Lamictal. He is awaiting admission to an inpatient drug treatment program (should be within the next 4 weeks). He has completed similar programs in the past. He has never seen a psychiatrist, psychologist or other mental health practitioner regularly  in the past. He did go to a marriage counseling session one time with his wife. Otherwise he has had no outpatient treatment. His wife reported that she was going to schedule him to see a psychiatrist but they required a $100 deposit which the patient did not have.  The patient and his wife denied any cognitive concerns prior to the hospitalization and AVM repair in 01/2012. His wife stated "he was a different person after that". She reported that he became more irritable, socially withdrawn and forgetful. She thinks his cognitive problems have remained relatively stable since 01/2012. She reported he frequently misplaces and loses things, and changes conversation topics without warning. She reported he forgets recent conversations, and repeats questions multiple times. The patient and his wife agree that he has difficulty concentrating, starts but doesn't always finish projects, sometimes has difficulty initiating tasks (procrastinates), is distractible, and is disorganized. His wife thinks he has poor time management, and she notes that in order for him to get to an 8 am appointment he has to wake up at 4 am. They deny any difficulty with word finding or comprehension. They did endorse reduced spelling abilities. He is not currently driving but when he does, he has to use GPS because he is not familiar with the area despite living here for 4 years.   Mr. Iodice is not currently employed. 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 The patient manages his medications independently. He and his wife agree he is doing fine with this right now because he  is home and not working, but as soon as he starts working he will have difficulty remembering to take his medications. His last seizure was about a month ago, per his wife. His wife manages the finances and appointments. He does some cooking but does forget to turn off the oven/stove.  The patient  describes his current mood as "fine". He denies depressed mood. When he does not take his medication, he is much more irritable. His wife reports he appears more anxious about social situations. He reported that he has a longstanding habit of early morning awakening (2:30 or 3 am) but takes naps as needed during the day. He does not feel his energy level is low. His appetite is good. He denied suicidal ideation. He denied hallucinations. He has had hallucinations in the past in the context of substance abuse.  He denied history of brain injury.  MRI of the brain completed on 09/06/2016 reportedly revealed: 1. No acute intracranial abnormality. 2. Satisfactory post treatment appearance of the anterior right frontal lobe AVM. Mild regional encephalomalacia and gliosis. 3. Normal MRI appearance of the brain elsewhere.  Family history is significant for substance abuse/dependence in multiple family members (father, all 5 brothers). He is a twin, and his twin brother is the only brother out of five that is still living. His twin brother is 30 years sober.    Social History: Education: GED (left high school in [place] in 11th grade, got GED in 2009) -- denied any significant learning problems, was involved in band, [employer] student. But did repeat a grade in school "but that was over a girl" -- think was 7th or 9th grade  Occupational history: Currently unemployed. Previously: Public affairs consultant, [employer] jobs, drove forklifts, Naval architect jobs, Holiday representative, Air traffic controller. Marital history: Married to his current wife x4 years. Previously married and divorced once before.  3 children (1 by first wife, 2 from another relationship). He reports positive relationships with his children -- ages 8, 61, 55. Alcohol/Tobacco/Substances: History of alcohol and cocaine abuse discussed above. Currently not using alcohol or any other drugs. Currently smokes about 1 ppd of cigarettes.   Medical History:  Past Medical  History:  Diagnosis Date  . AVM (arteriovenous malformation)   . Hx of substance abuse   . Hypertension   . Seizure disorder (HCC)   . Seizures (HCC)     Current medications:  Outpatient Encounter Prescriptions as of 10/14/2016  Medication Sig  . albuterol (PROVENTIL HFA;VENTOLIN HFA) 108 (90 BASE) MCG/ACT inhaler Inhale 1-2 puffs into the lungs every 6 (six) hours as needed for wheezing or shortness of breath.  Marland Kitchen amLODipine (NORVASC) 10 MG tablet Take 1 tablet (10 mg total) by mouth daily.  . baclofen (LIORESAL) 10 MG tablet TK 1 T PO  WITH FOOD OR MILK TID.  Marland Kitchen budesonide-formoterol (SYMBICORT) 160-4.5 MCG/ACT inhaler Inhale 2 puffs into the lungs 2 (two) times daily.  . folic acid (FOLVITE) 1 MG tablet Take 1 tablet (1 mg total) by mouth daily.  Marland Kitchen lamoTRIgine (LAMICTAL) 100 MG tablet Take 1 tablet (100 mg total) by mouth 2 (two) times daily.  . Multiple Vitamin (MULTIVITAMIN WITH MINERALS) TABS tablet Take 1 tablet by mouth daily. (Patient not taking: Reported on 09/02/2016)  . nicotine (NICODERM CQ - DOSED IN MG/24 HOURS) 21 mg/24hr patch Place 1 patch (21 mg total) onto the skin daily.  Marland Kitchen thiamine 100 MG tablet Take 1 tablet (100 mg total) by mouth daily. (Patient not taking: Reported on 09/02/2016)  . thiamine  100 MG tablet Take 1 tablet (100 mg total) by mouth daily.  Marland Kitchen tiotropium (SPIRIVA) 18 MCG inhalation capsule Place 1 capsule (18 mcg total) into inhaler and inhale daily.   No facility-administered encounter medications on file as of 10/14/2016.      Current Examination:  Behavioral Observations:   Appearance: Neatly and and appropriately dressed and groomed Gait: Ambulated independently, no abnormalities observed Speech: Fluent; normal rate, rhythm and volume Thought process: Linear, goal directed Affect: Full, generally euthymic Interpersonal: Pleasant, appropriate, interactive and engaged Orientation: Oriented to all spheres. Accurately named the current President and his  predecessor.  Tests Administered: . Test of Premorbid Functioning (TOPF) . Wechsler Adult Intelligence Scale-Fourth Edition (WAIS-IV): Similarities, Block Design, Matrix Reasoning, Arithmetic, Symbol Search, Coding and Digit Span subtests . Wechsler Memory Scale-Fourth Edition (WMS-IV) Adult Version (ages 37-69): Logical Memory I, II and Recognition subtests  . [place] Verbal Learning Test - 2nd Edition (CVLT-2) Standard Form . [facility] (WCST) . Repeatable Battery for the Assessment of Neuropsychological Status (RBANS) Form A:  Figure Copy and Figure Recall subtests . Neuropsychological Assessment Battery (NAB) Language Module, Form 1:  Naming and Bill Payment subtests . Controlled Oral Word Association Test (COWAT) . Trail Making Test A and B . [place] Diagnostic Aphasia Examination (BDAE): Commands Subtest . Beck Depression Inventory - Second edition (BDI-II) . Personality Assessment Inventory (PAI)  Test Results: Note: Standardized scores are presented only for use by appropriately trained professionals and to allow for any future test-retest comparison. These scores should not be interpreted without consideration of all the information that is contained in the rest of the report. The most recent standardization samples from the test publisher or other sources were used whenever possible to derive standard scores; scores were corrected for age, gender, ethnicity and education when available.   Test Scores:  Test Name Raw Score Standardized Score Descriptor  TOPF 48/70 SS= 105 Average  WAIS-IV Subtests     Similarities 20/36 ss= 7 Low average  Block Design 42/66 ss= 11 Average  Matrix Reasoning 15/26 ss= 9 Average  Arithmetic 15/22 ss= 10 Average  Symbol Search 28/60 ss= 9 Average  Coding 53/135 ss= 8 Average  Digit Span 28/48 ss= 10 Average  WAIS-IV Index Scores     Working Memory  SS= 100 Average  Processing Speed  SS= 92 Average  WMS-IV Subtests     LM I  18/50 ss= 7 Low average  LM II 15/50 ss= 7 Low average  LM II Recognition 25/30 Cum %: 51-75 WNL  CVLT-II Scores     Trial 1 5/16 Z= -1 Low average  Trial 5 10/16 Z= -0.5 Average  Trials 1-5 total 38/80 T= 44 Average  SD Free Recall 5/16 Z= -1 Low average  SD Cued Recall 8/16 Z= -1 Low average  LD Free Recall 8/16 Z= -0.5 Average  LD Cued Recall 8/16 Z= -1 Low average  Recognition Total Hits 14/16 Z=0 Average  Recognition False Positives 7 Z= -1 Low average  Recognition Discriminability 1.9 Z=-1 Low average  Forced Choice Recognition 16/16  WNL  WCST     Total Errors 15 T= 50 Average  Perseverative Responses 6 T= 53 Average  Perseverative Errors 6 T= 53 Average  Conceptual Level Responses 45 T= 50 Average  Categories Completed 4 >16% WNL  Trials to Complete 1st Category 12 >16% WNL  Failure to Maintain Set 0  WNL  RBANS Subtests     Figure Copy 19/20 Z= 0.6 Average  Figure Recall 12/20 Z= -0.5 Average  NAB Language subtests     Naming 31/31 T= 55 Average  Bill Payment 18/19 T= 57 High average  COWAT-FAS 63 T= 71 Very superior  COWAT-Animals 27 T= 67 Superior  BDAE Commands  15/15  WNL  Trail Making Test A  44" 1 error T= 42 Low average  Trail Making Test B  61" 0 errors T= 57 High average  BDI-II 10/63  WNL  PAI  (Only elevated clinical scales are shown here)     ALC  T= 86   DRG  T= 88      Description of Test Results:  Embedded performance validity indicators were within normal limits. As such, the patient's current performance on neurocognitive testing is judged to be a relatively accurate representation of his current level of neurocognitive functioning.   Premorbid verbal intellectual abilities were estimated to have been within the average range based on a test of word reading. Psychomotor processing speed was average. Auditory attention and working memory were average. Visual-spatial construction was average. Language abilities were intact. Specifically,  confrontation naming was average with 100% accuracy, and semantic verbal fluency was superior. Auditory comprehension of multi-step commands was intact. Performance on a simulated bill payment task (requiring multiple aspects of expressive and receptive language) was above average. With regard to verbal memory, encoding and acquisition of non-contextual information (i.e., word list) was average. After an interference task, free recall was low average (5/16 items recalled). With semantic cueing, he recalled an additional three words (low average). After a delay, free recall was average (8/16 items recalled, demonstrating good retention across the delay). He did not benefit from semantic cueing. Performance on a yes/no recognition task was low average; he demonstrated mildly elevated number of false positive responses. On another verbal memory test, encoding and acquisition of contextual auditory information (i.e., short story) was low average. After a delay, free recall was low average. Performance on a yes/no recognition task was within normal limits. With regard to non-verbal memory, delayed free recall of visual information was average. Performance on tests of executive functioning was within normal limits. Mental flexibility and set-shifting were high average on Trails B. Verbal fluency with phonemic search restrictions was very superior. Verbal abstract reasoning was low average. Non-verbal abstract reasoning was average. Deductive reasoning and problem solving were average.  On a self-report measure of mood (BDI-II), the patient's responses were not indicative of clinically significant depression at the present time. The patient was also administered a more extensive measure of psychopathology and personality (PAI). He produced a valid PAI profile, suggesting that the patient answered in a reasonably forthright manner and did not attempt to present an unrealistic or inaccurate impression that was either more  negative or more positive than the clinical picture would warrant. His PAI clinical profile is marked by significant elevations, indicating the presence of clinical features that are likely to be sources of difficulty for the patient.  The configuration of the clinical scales suggests a person with a history of polysubstance abuse, including alcohol as well as other drugs.  When disinhibited by the substance use, other acting-out behaviors may become apparent as well.  The substance abuse is probably causing severe disruptions in his social relationships and his work International aid/development worker, with these difficulties serving as additional sources of stress and perhaps further aggravating his tendency to drink and use drugs. The patient indicates that his use of drugs has had many negative consequences on his life at a level  that is above average even for individuals in specialized treatment for drug problems.  Such a pattern indicates that his use of drugs has had numerous ill effects on his functioning.  Problems associated with drug abuse are probably found across several life areas, including strained interpersonal relationships, legal difficulties, vocational failures, financial hardship, and/or possible medical complications resulting from prolonged drug use.  He reports having little ability to control the effect that drugs are having on his life.  With this level of problems it is increasingly likely that he is drug-dependent and withdrawal symptoms may be a part of the present clinical picture.  The withdrawal syndrome will vary according to the substance of choice, but such syndromes can include many psychopathological phenomena such as concentration problems, anxiety, and depression. The patient reports that his use of alcohol has had a negative impact on his life to an extent that is higher than average even among individuals in treatment for alcohol problems.  Such a pattern indicates that his use of alcohol has had  a number of adverse consequences on his life.  Numerous alcohol-related problems are probable, including difficulties in interpersonal relationships, difficulties on the job, and possible health complications.  He is likely to be unable to cut down on his drinking despite repeated attempts at sobriety.  Given this pattern, it is increasingly likely that he is alcohol-dependent and has suffered the consequences in terms of physiological signs of withdrawal, lost employment, strained family relationships, and financial hardship. It appears that the patient has a history of involvement in intense and volatile relationships.  In these relationships, he tends to be preoccupied with fears of being abandoned or rejected by those people important to him. The patient indicates that he occasionally experiences, or may experience to a mild degree, maladaptive behavior patterns aimed at controlling anxiety.   The patient indicates some concerns about physical functioning and health matters in general.   He reports a personality style that involves a degree of adventurousness, risk-taking, and a tendency to be rather impulsive.  Others may view him as pragmatic and perhaps unsympathetic in his relationships.  Certain elements of the patient's self-description suggests that others are likely to see him as being withdrawn, aloof, and somewhat unconventional. According to the patient's self-report, he describes NO significant problems in the following areas: problems with empathy; undue suspiciousness or hostility; unhappiness and depression; unusually elevated mood or heightened activity; marked anxiety.  With respect to suicidal ideation, the patient does report experiencing periodic and perhaps transient thoughts of self-harm.  He is probably pessimistic and unhappy about his prospects for the future.  Regular follow-up regarding the details of his suicidal thoughts and the potential for suicidal behavior is  warranted.   Clinical Impressions: Polysubstance use disorder (alcohol and cocaine), in partial sustained remission. Results of cognitive testing were largely within normal limits. There were no areas of clinically significant impairment, and several performances were above average. A mild relative weakness was observed in verbal memory (encoding / immediate retrieval), but this was not in the impaired range. Overall these test results do not suggest the presence of a neurocognitive disorder, which is very fortunate given his history of polysubstance abuse/dependence, seizures and AVM. I suspect his cognitive symptoms in daily life are related to mood and acute substance abuse and withdrawal. Psychological testing did not reveal any indications of psychopathology aside from his known polysubstance use disorder.     Recommendations/Plan: Based on the findings of the present evaluation, the following recommendations  are offered:  1. The patient is an excellent candidate for an inpatient drug and alcohol treatment program. I have no concerns about his ability to engage in this type of program from a cognitive perspective. 2. The patient and his wife will be reassured that his testing results do not show any significant cognitive impairment at this time. He is at high risk for developing cognitive impairment if he relapses and abuses alcohol/cocaine in the future. He is at high risk of suicide if he relapses as well. He should be treated by a psychiatrist and psychologist or substance abuse counselor on an outpatient basis once he is discharged from inpatient treatment. 3. Strategies to enhance cognitive functioning and improve memory encoding will be reviewed with the patient.   Feedback to Patient: Tosh Glaze and his wife returned for a feedback appointment on 10/14/2016 to review the results of his neuropsychological evaluation with this provider. 20 minutes face-to-face time was spent reviewing  his test results, my impressions and my recommendations as detailed above.    Total time spent on this patient's case: [id number] unit for interview with psychologist; [phone] units of testing by psychometrician under psychologist's supervision; [phone] units for medical record review, scoring of neuropsychological tests, interpretation of test results, preparation of this report, and review of results to the patient by psychologist.      Thank you for your referral of Gurnoor Ursua. Please feel free to contact me if you have any questions or concerns regarding this report.

## 2016-10-14 ENCOUNTER — Encounter: Payer: Self-pay | Admitting: Psychology

## 2016-10-14 ENCOUNTER — Ambulatory Visit (INDEPENDENT_AMBULATORY_CARE_PROVIDER_SITE_OTHER): Payer: Federal, State, Local not specified - PPO | Admitting: Psychology

## 2016-10-14 ENCOUNTER — Telehealth: Payer: Self-pay | Admitting: Neurology

## 2016-10-14 DIAGNOSIS — Q282 Arteriovenous malformation of cerebral vessels: Secondary | ICD-10-CM

## 2016-10-14 DIAGNOSIS — G40909 Epilepsy, unspecified, not intractable, without status epilepticus: Secondary | ICD-10-CM

## 2016-10-14 DIAGNOSIS — F1990 Other psychoactive substance use, unspecified, uncomplicated: Secondary | ICD-10-CM

## 2016-10-14 NOTE — Patient Instructions (Signed)
Results of cognitive testing were largely within normal limits. There were no areas of clinically significant impairment, and several performances were above average. A mild relative weakness was observed in verbal memory (encoding / immediate retrieval), but this was not in the impaired range. Overall these test results do not suggest the presence of a neurocognitive disorder, which is very fortunate given his history of polysubstance abuse/dependence, seizures and AVM. I suspect his cognitive symptoms in daily life are related to mood and acute substance abuse and withdrawal. Psychological testing did not reveal any indications of psychopathology aside from his known polysubstance use disorder.     Recommendations/Plan: Based on the findings of the present evaluation, the following recommendations are offered:  1. The patient is an excellent candidate for an inpatient drug and alcohol treatment program. I have no concerns about his ability to engage in this type of program from a cognitive perspective. 2. The patient and his wife will be reassured that his testing results do not show any significant cognitive impairment at this time. He is at high risk for developing cognitive impairment if he relapses and abuses alcohol/cocaine in the future. He is at high risk of suicide if he relapses as well. He should be treated by a psychiatrist and psychologist or substance abuse counselor on an outpatient basis once he is discharged from inpatient treatment. 3. Strategies to enhance cognitive functioning and improve memory encoding are below.  Strategies to enhance cognitive functioning Attention, concentration, memory encoding and consolidation    . Make a plan and be prepared o If you find that you are more attentive at certain times of the day (i.e., the morning), plan important activities and discussions at that time o Determine which activities take the most time and which are most important, then  prioritize your "to do list" based on this information o Break tasks into simpler parts, understand the steps involved before starting o Rehearse the steps mentally or write them down. If you write them down, you can use this as a checklist to check off as you complete them. o Visualize completing the task  . Use external aids  o Write everything down that you do not need to know or work with right now. Don't store extra information in your brain that you don't need right now.  o Use a calendar or planner to make checklists, due dates and "to do" lists. o Use ONLY ONE calendar or planner and look at it frequently o Set alarms for important deadlines or appointments  . Minimize interruptions and distractions  o Find a good work environment, e.g., quiet room with a desk, close curtains, use earplugs, mask sounds with a fan or white noise machine o Turn off cell phone and/or email alerts during important tasks. In fact, it is helpful to schedule a block of time each day where you limit phone and email interruptions and focus on just the more detailed work you have. o Try to minimize the amount of background noise (i.e., television, music) when engaged in important tasks or conversations with others (note that some individuals find soft background music helpful in minimize distraction, so you may need to experiment with optimal level of noise for specific situations)  . Use active effort = consciously attending to details, closely analyzing o Failures of encoding may reflect failure to attend to one's own actions o Be prepared to work more slowly than you usually do  o When reading, allow time for re-reading sections  o  Check your work for errors  . Avoid multitasking o Do not attempt to complete more than one task at one time. Focus on one task until it is completed and then move on to the next one. o Avoid other activities while engaged in important tasks, such as talking on the phone while  balancing the checkbook, making a shopping list during a meeting.   . Use self-talk during tasks o Repeat the steps of the activity to yourself as you complete them o Talk to yourself about your progress o This helps you maintain focus on the task and makes it easier to remember completing the task (Similar to "active effort" above)  . Conserve energy o Conserve energy to avoid fatigue and its effects on cognition - Get enough sleep - Pace yourself  and make sure to take breaks - Be open to receiving help - Exercise for increased energy  . Conversational vigilance = paying attention during a conversation  o Listen actively: focus on the speaker and position yourself so that you can clearly hear the him/her, and have open/relaxed posture  o Eye contact: Maintaining eye contact with the person you are speaking with may increase the likelihood that important information is properly received  o Ask questions: Ask questions for clarification (e.g., request that the speaker explain something in a different way) or ask for information to be repeated if you become distracted, or if you do not hear or understand something during a conversation  o Paraphrase: Summarize or repeat back important information from a conversation in your own words to facilitate communication and ensure that you have heard correctly and understand

## 2016-10-14 NOTE — Telephone Encounter (Signed)
Neuropsychological evaluation 10/14/16:  Clinical Impressions: Polysubstance use disorder (alcohol and cocaine), in partial sustained remission. Results of cognitive testing were largely within normal limits. There were no areas of clinically significant impairment, and several performances were above average. A mild relative weakness was observed in verbal memory (encoding / immediate retrieval), but this was not in the impaired range. Overall these test results do not suggest the presence of a neurocognitive disorder, which is very fortunate given his history of polysubstance abuse/dependence, seizures and AVM. I suspect his cognitive symptoms in daily life are related to mood and acute substance abuse and withdrawal. Psychological testing did not reveal any indications of psychopathology aside from his known polysubstance use disorder.     Recommendations/Plan: Based on the findings of the present evaluation, the following recommendations are offered:  1. The patient is an excellent candidate for an inpatient drug and alcohol treatment program. I have no concerns about his ability to engage in this type of program from a cognitive perspective. 2. The patient and his wife will be reassured that his testing results do not show any significant cognitive impairment at this time. He is at high risk for developing cognitive impairment if he relapses and abuses alcohol/cocaine in the future. He is at high risk of suicide if he relapses as well. He should be treated by a psychiatrist and psychologist or substance abuse counselor on an outpatient basis once he is discharged from inpatient treatment. 3. Strategies to enhance cognitive functioning and improve memory encoding will be reviewed with the patient.

## 2016-11-19 DIAGNOSIS — M79662 Pain in left lower leg: Secondary | ICD-10-CM | POA: Diagnosis not present

## 2016-12-10 DIAGNOSIS — M79662 Pain in left lower leg: Secondary | ICD-10-CM | POA: Diagnosis not present

## 2017-01-23 ENCOUNTER — Other Ambulatory Visit (HOSPITAL_COMMUNITY): Payer: Self-pay | Admitting: Family Medicine

## 2017-01-23 DIAGNOSIS — R42 Dizziness and giddiness: Secondary | ICD-10-CM

## 2017-02-04 ENCOUNTER — Encounter (HOSPITAL_COMMUNITY): Payer: Self-pay

## 2017-02-04 ENCOUNTER — Ambulatory Visit (HOSPITAL_COMMUNITY): Admission: RE | Admit: 2017-02-04 | Payer: Federal, State, Local not specified - PPO | Source: Ambulatory Visit

## 2017-02-04 ENCOUNTER — Ambulatory Visit (HOSPITAL_COMMUNITY): Payer: Federal, State, Local not specified - PPO

## 2017-02-11 ENCOUNTER — Other Ambulatory Visit (HOSPITAL_COMMUNITY): Payer: Self-pay

## 2017-02-11 ENCOUNTER — Ambulatory Visit (HOSPITAL_COMMUNITY): Payer: Federal, State, Local not specified - PPO

## 2017-02-12 ENCOUNTER — Other Ambulatory Visit (HOSPITAL_COMMUNITY): Payer: Self-pay

## 2017-02-12 ENCOUNTER — Ambulatory Visit (INDEPENDENT_AMBULATORY_CARE_PROVIDER_SITE_OTHER): Payer: Federal, State, Local not specified - PPO | Admitting: Adult Health

## 2017-02-12 ENCOUNTER — Encounter: Payer: Self-pay | Admitting: Adult Health

## 2017-02-12 ENCOUNTER — Ambulatory Visit (HOSPITAL_COMMUNITY)
Admission: RE | Admit: 2017-02-12 | Discharge: 2017-02-12 | Disposition: A | Discharge status: Home / Self Care | Payer: Federal, State, Local not specified - PPO | Source: Ambulatory Visit | Attending: Family Medicine | Admitting: Family Medicine

## 2017-02-12 VITALS — BP 128/75 | HR 75 | Ht 73.0 in | Wt 198.2 lb

## 2017-02-12 DIAGNOSIS — Z8774 Personal history of (corrected) congenital malformations of heart and circulatory system: Secondary | ICD-10-CM

## 2017-02-12 DIAGNOSIS — F1911 Other psychoactive substance abuse, in remission: Secondary | ICD-10-CM

## 2017-02-12 DIAGNOSIS — R42 Dizziness and giddiness: Secondary | ICD-10-CM | POA: Diagnosis not present

## 2017-02-12 DIAGNOSIS — R569 Unspecified convulsions: Secondary | ICD-10-CM

## 2017-02-12 DIAGNOSIS — Q282 Arteriovenous malformation of cerebral vessels: Secondary | ICD-10-CM | POA: Insufficient documentation

## 2017-02-12 DIAGNOSIS — G9389 Other specified disorders of brain: Secondary | ICD-10-CM | POA: Diagnosis not present

## 2017-02-12 DIAGNOSIS — Z87898 Personal history of other specified conditions: Secondary | ICD-10-CM | POA: Diagnosis not present

## 2017-02-12 MED ORDER — GADOBENATE DIMEGLUMINE 529 MG/ML IV SOLN
20.0000 mL | Freq: Once | INTRAVENOUS | Status: AC | PRN
  Administered 2017-02-12: 20 mL via INTRAVENOUS

## 2017-02-12 MED ORDER — LACOSAMIDE 50 MG PO TABS: 50.0000 mg | ORAL_TABLET | Freq: Two times a day (BID) | ORAL | 5 refills | Status: DC

## 2017-02-12 NOTE — Progress Notes (Signed)
Fax confirmation vimpat [facility] [phone]. sy

## 2017-02-12 NOTE — Patient Instructions (Addendum)
Your Plan:  Start Vimpat 50 mg twice a day If you have any seizure events please let us know  Thank you for coming to see [place] at [facility]. I hope we have been able to provide you high quality care today.  You may receive a patient satisfaction survey over the next few weeks. We would appreciate your feedback and comments so that we may continue to improve ourselves and the health of our patients.  Lacosamide tablets What is this medicine? LACOSAMIDE (la KOE sa mide) is used to control seizures caused by certain types of epilepsy. This medicine may be used for other purposes; ask your health care provider or pharmacist if you have questions. COMMON BRAND NAME(S): Vimpat What should I tell my health care provider before I take this medicine? They need to know if you have any of these conditions: -dehydration -heart disease, including heart failure -history of a drug or alcohol abuse problem -kidney disease -liver disease -suicidal thoughts, plans, or attempt; a previous suicide attempt by you or a family member -an unusual or allergic reaction to lacosamide, other medicines, foods, dyes, or preservatives -pregnant or trying to get pregnant -breast-feeding How should I use this medicine? Take this medicine by mouth with a glass of water. You can take it with or without food. Follow the directions on the prescription label. Take your doses at regular intervals. Do not take your medicine more often than directed. Do not stop taking except on the advice of your doctor or health care professional. A special MedGuide will be given to you by the pharmacist with each prescription and refill. Be sure to read this information carefully each time. Talk to your pediatrician regarding the use of this medicine in children. While this drug may be prescribed for children as young as 93 years of age for selected conditions, precautions do apply. Overdosage: If you think you have taken  too much of this medicine contact a poison control center or emergency room at once. NOTE: This medicine is only for you. Do not share this medicine with others. What if I miss a dose? If you miss a dose, take it as soon as you can. If it is almost time for your next dose, take only that dose. Do not take double or extra doses. What may interact with this medicine? -atazanavir -beta-blockers like metoprolol and propranolol -calcium channel blockers like diltiazem and verapamil -digoxin -dronedarone -lopinavir/ritonavir This list may not describe all possible interactions. Give your health care provider a list of all the medicines, herbs, non-prescription drugs, or dietary supplements you use. Also tell them if you smoke, drink alcohol, or use illegal drugs. Some items may interact with your medicine. What should I watch for while using this medicine? Visit your doctor or health care professional for regular checks on your progress. This medicine needs careful monitoring. Wear a medical ID bracelet or chain, and carry a card that describes your disease and details of your medicine and dosage times. You may get drowsy or dizzy. Do not drive, use machinery, or do anything that needs mental alertness until you know how this medicine affects you. Do not stand or sit up quickly, especially if you are an older patient. This reduces the risk of dizzy or fainting spells. Alcohol may interfere with the effect of this medicine. Avoid alcoholic drinks. The use of this medicine may increase the chance of suicidal thoughts or actions. Pay special attention to how you are responding while on this  medicine. Any worsening of mood, or thoughts of suicide or dying should be reported to your health care professional right away. Women who become pregnant while using this medicine may enroll in the [place] American Antiepileptic Drug Pregnancy Registry by calling [phone]. This registry collects information about the  safety of antiepileptic drug use during pregnancy. What side effects may I notice from receiving this medicine? Side effects that you should report to your doctor or health care professional as soon as possible: -allergic reactions like skin rash, itching or hives, swelling of the face, lips, or tongue -confusion -feeling faint or lightheaded, falls -fever -irregular heart beat -loss of memory -suicidal thoughts or other mood changes -unusually weak or tired -yellowing of the eyes, skin Side effects that usually do not require medical attention (report to your doctor or health care professional if they continue or are bothersome): -constipation -diarrhea -drowsiness -dry mouth -headache -nausea This list may not describe all possible side effects. Call your doctor for medical advice about side effects. You may report side effects to FDA at 1-800-FDA-1088. Where should I keep my medicine? Keep out of the reach of children. This medicine can be abused. Keep your medicine in a safe place to protect it from theft. Do not share this medicine with anyone. Selling or giving away this medicine is dangerous and against the law. This medicine may cause accidental overdose and death if it taken by other adults, children, or pets. Mix any unused medicine with a substance like cat litter or coffee grounds. Then throw the medicine away in a sealed container like a sealed bag or a coffee can with a lid. Do not use the medicine after the expiration date. Store at room temperature between 15 and 30 degrees C (59 and 86 degrees F). NOTE: This sheet is a summary. It may not cover all possible information. If you have questions about this medicine, talk to your doctor, pharmacist, or health care provider.  2018 Elsevier/Gold Standard (2016-05-08 08:32:52)

## 2017-02-12 NOTE — Progress Notes (Signed)
PATIENT: Samuel Atkinson DOB: [birth year]  REASON FOR VISIT: follow up HISTORY FROM: patient  HISTORY OF PRESENT ILLNESS: Today 02/12/17 Samuel Atkinson is a 51 year old male with a history of seizures, frontal AVM status post coiling. He returns today for follow-up. Since the last visit the patient rehabilitation program. He reports he was not allowed to be on medication during this program. Therefore he is really not on any seizure medication. He states that he is no longer in that program. He states that he has not had any seizure events" that he knows of." He is not operating a motor vehicle. He denies alcohol or cocaine use. The patient has been on Topamax, Keppra and Lamictal in the past. Patient reports that he has injured his back. He plans to see his primary care within the next several days. He returns today for an evaluation.  HISTORY 08/13/16: Samuel Atkinson is a 51 year old right-handed black male with a history of seizures. The patient has a history of a frontal AVM status post coiling procedure. The patient has not had a seizure in over the last year that he knows of. He has had ongoing issues with substance abuse with alcohol and cocaine. He has not been taking his Keppra or his Lamictal. He has had increasing problems with irritability according to his wife. The patient has also developed a memory issue. He denies any numbness or weakness of the face, arms, or legs. He denies any balance issues or falls. He does work loading trucks. He does not operate a motor vehicle. The patient comes to the office on an urgent request of the wife.   REVIEW OF SYSTEMS: Out of a complete 14 system review of symptoms, the patient complains only of the following symptoms, and all other reviewed systems are negative. See history of present illness  See history of present illness.  ALLERGIES: Allergies  Allergen Reactions  . Latex Rash    HOME MEDICATIONS: Outpatient Medications Prior to Visit    Medication Sig Dispense Refill  . albuterol (PROVENTIL HFA;VENTOLIN HFA) 108 (90 BASE) MCG/ACT inhaler Inhale 1-2 puffs into the lungs every 6 (six) hours as needed for wheezing or shortness of breath.    Marland Kitchen amLODipine (NORVASC) 10 MG tablet Take 1 tablet (10 mg total) by mouth daily. 30 tablet 0  . baclofen (LIORESAL) 10 MG tablet TK 1 T PO  WITH FOOD OR MILK TID.  0  . budesonide-formoterol (SYMBICORT) 160-4.5 MCG/ACT inhaler Inhale 2 puffs into the lungs 2 (two) times daily. 1 Inhaler 12  . folic acid (FOLVITE) 1 MG tablet Take 1 tablet (1 mg total) by mouth daily.    Marland Kitchen lamoTRIgine (LAMICTAL) 100 MG tablet Take 1 tablet (100 mg total) by mouth 2 (two) times daily. 60 tablet 0  . Multiple Vitamin (MULTIVITAMIN WITH MINERALS) TABS tablet Take 1 tablet by mouth daily. 30 tablet 0  . nicotine (NICODERM CQ - DOSED IN MG/24 HOURS) 21 mg/24hr patch Place 1 patch (21 mg total) onto the skin daily. 28 patch 0  . thiamine 100 MG tablet Take 1 tablet (100 mg total) by mouth daily. 30 tablet 0  . thiamine 100 MG tablet Take 1 tablet (100 mg total) by mouth daily.    Marland Kitchen tiotropium (SPIRIVA) 18 MCG inhalation capsule Place 1 capsule (18 mcg total) into inhaler and inhale daily. 30 capsule 0   No facility-administered medications prior to visit.     PAST MEDICAL HISTORY: Past Medical History:  Diagnosis Date  .  AVM (arteriovenous malformation)   . Hx of substance abuse   . Hypertension   . Seizure disorder (HCC)   . Seizures (HCC)     PAST SURGICAL HISTORY: Past Surgical History:  Procedure Laterality Date  . AVM Embolization  2013    FAMILY HISTORY: Family History  Problem Relation Age of Onset  . Hypertension Father   . Coronary artery disease Father   . Alcohol abuse Father   . Diabetes Mother   . Hypertension Mother   . Healthy Brother   . Coronary artery disease Brother   . Drug abuse Brother   . Drug abuse Brother   . Coronary artery disease Brother     SOCIAL  HISTORY: Social History   Social History  . Marital status: Married    Spouse name: Samuel Atkinson  . Number of children: 3  . Years of education: GED    Occupational History  . Unemployed    Social History Main Topics  . Smoking status: Current Every Day Smoker    Packs/day: 1.00    Types: Cigarettes  . Smokeless tobacco: Never Used  . Alcohol use Yes     Comment: Last used 09/02/2016  . Drug use: Yes    Types: Cocaine     Comment: Last used 09/02/2016  . Sexual activity: Yes    Birth control/ protection: None   Other Topics Concern  . Not on file   Social History Narrative   Patient lives at home with Samuel Ports(Valerie). Patient works full time time.   Education 12 th grade.   Caffeine 3-4 cups of caffeine daily.   Right handed.      PHYSICAL EXAM  Vitals:   02/12/17 1432  BP: 128/75  Pulse: 75  Weight: 198 lb 3.2 oz (89.9 kg)  Height: 6\' 1"  (1.854 m)   Body mass index is 26.15 kg/m.  Generalized: Well developed, in no acute distress   Neurological examination  Mentation: Alert oriented to time, place, history taking. Follows all commands speech and language fluent Cranial nerve II-XII: Pupils were equal round reactive to light. Extraocular movements were full, visual field were full on confrontational test. Facial sensation and strength were normal. Uvula tongue midline. Head turning and shoulder shrug  were normal and symmetric. Motor: The motor testing reveals 5 over 5 strength of all 4 extremities. Good symmetric motor tone is noted throughout.  Sensory: Sensory testing is intact to soft touch on all 4 extremities. No evidence of extinction is noted.  Coordination: Cerebellar testing reveals good finger-nose-finger and heel-to-shin bilaterally.  Gait and station: Patient has some difficulty with ambulation due to back pain.   DIAGNOSTIC DATA (LABS, IMAGING, TESTING) - I reviewed patient records, labs, notes, testing and imaging myself where available.  Lab Results   Component Value Date   WBC 4.4 09/06/2016   HGB 15.4 09/06/2016   HCT 45.0 09/06/2016   MCV 94.5 09/06/2016   PLT 188 09/06/2016      Component Value Date/Time   NA 138 09/06/2016 0558   NA 142 10/17/2015 0833   K 3.9 09/06/2016 0558   CL 101 09/06/2016 0558   CO2 26 09/06/2016 0558   GLUCOSE 103 (H) 09/06/2016 0558   BUN 7 09/06/2016 0558   BUN 14 10/17/2015 0833   CREATININE 1.08 09/06/2016 0558   CALCIUM 8.9 09/06/2016 0558   PROT 6.3 (L) 09/03/2016 0638   PROT 6.4 10/17/2015 0833   ALBUMIN 3.6 09/03/2016 0638   ALBUMIN 4.2 10/17/2015 16100833  AST 38 09/03/2016 0638   ALT 28 09/03/2016 0638   ALKPHOS 55 09/03/2016 0638   BILITOT 1.2 09/03/2016 0638   BILITOT 0.5 10/17/2015 0833   GFRNONAA >60 09/06/2016 0558   GFRAA >60 09/06/2016 0558      ASSESSMENT AND PLAN 51 y.o. year old male  has a past medical history of AVM (arteriovenous malformation); substance abuse; Hypertension; Seizure disorder (HCC); and Seizures (HCC). here with:  1. Seizures 2. History of AVM 3. History of substance abuse  I advised patient that he should consider restarting seizure medication. Fortunately he is not had any seizures and he is aware of. The patient does not want me on any medication that alters his mood. We discussed Vimpat. He is amenable to trying this. He will begin with 50 mg twice a day. I have reviewed side effects with the patient and his wife. I've also given them a handout. Patient is advised that if he has any seizure events or if he is unable to tolerate the medication patient let us know. He will follow-up in 6 months or sooner if needed.    Butch Penny, MSN, NP-C 02/12/2017, 2:52 PM [facility] [address], [address] [place], [place] [address] [phone]

## 2017-02-12 NOTE — Progress Notes (Signed)
I have read the note, and I agree with the clinical assessment and plan.  Davonne Jarnigan KEITH   

## 2017-02-17 ENCOUNTER — Telehealth: Payer: Self-pay | Admitting: Neurology

## 2017-02-17 NOTE — Telephone Encounter (Signed)
MRI of the brain was ordered through [name].   MRI brain and MRA head 02/12/17:  IMPRESSION: 1. Right anterior frontal arteriovenous malformation coil mass. No evidence for residual or recurrent arteriovenous malformation. Stable small surrounding region of gliosis. 2. No evidence for acute/early subacute infarction, interval intracranial hemorrhage, or focal mass effect. 3. Patent circle of Willis. No large vessel occlusion, aneurysm, or significant stenosis is identified.

## 2017-03-13 DIAGNOSIS — M5416 Radiculopathy, lumbar region: Secondary | ICD-10-CM | POA: Diagnosis not present

## 2017-03-30 DIAGNOSIS — M545 Low back pain: Secondary | ICD-10-CM | POA: Diagnosis not present

## 2017-03-30 DIAGNOSIS — Z6825 Body mass index (BMI) 25.0-25.9, adult: Secondary | ICD-10-CM | POA: Diagnosis not present

## 2017-04-23 DIAGNOSIS — R1013 Epigastric pain: Secondary | ICD-10-CM | POA: Diagnosis not present

## 2017-04-23 DIAGNOSIS — K5900 Constipation, unspecified: Secondary | ICD-10-CM | POA: Diagnosis not present

## 2017-04-28 DIAGNOSIS — M545 Low back pain: Secondary | ICD-10-CM | POA: Diagnosis not present

## 2017-05-01 ENCOUNTER — Encounter (HOSPITAL_COMMUNITY): Payer: Self-pay | Admitting: Emergency Medicine

## 2017-05-01 ENCOUNTER — Observation Stay (HOSPITAL_COMMUNITY)
Admission: EM | Admit: 2017-05-01 | Discharge: 2017-05-03 | Disposition: A | Discharge status: Home / Self Care | Payer: Federal, State, Local not specified - PPO | Attending: Family Medicine | Admitting: Family Medicine

## 2017-05-01 DIAGNOSIS — I10 Essential (primary) hypertension: Secondary | ICD-10-CM | POA: Diagnosis not present

## 2017-05-01 DIAGNOSIS — Z79899 Other long term (current) drug therapy: Secondary | ICD-10-CM | POA: Insufficient documentation

## 2017-05-01 DIAGNOSIS — F1910 Other psychoactive substance abuse, uncomplicated: Secondary | ICD-10-CM | POA: Diagnosis not present

## 2017-05-01 DIAGNOSIS — M791 Myalgia, unspecified site: Secondary | ICD-10-CM

## 2017-05-01 DIAGNOSIS — M6282 Rhabdomyolysis: Principal | ICD-10-CM

## 2017-05-01 DIAGNOSIS — F329 Major depressive disorder, single episode, unspecified: Secondary | ICD-10-CM | POA: Diagnosis not present

## 2017-05-01 DIAGNOSIS — Z91040 Latex allergy status: Secondary | ICD-10-CM | POA: Diagnosis not present

## 2017-05-01 DIAGNOSIS — R569 Unspecified convulsions: Secondary | ICD-10-CM

## 2017-05-01 DIAGNOSIS — F17210 Nicotine dependence, cigarettes, uncomplicated: Secondary | ICD-10-CM | POA: Diagnosis not present

## 2017-05-01 DIAGNOSIS — F1010 Alcohol abuse, uncomplicated: Secondary | ICD-10-CM | POA: Insufficient documentation

## 2017-05-01 DIAGNOSIS — G40909 Epilepsy, unspecified, not intractable, without status epilepticus: Secondary | ICD-10-CM | POA: Diagnosis not present

## 2017-05-01 DIAGNOSIS — R200 Anesthesia of skin: Secondary | ICD-10-CM | POA: Diagnosis not present

## 2017-05-01 DIAGNOSIS — F1420 Cocaine dependence, uncomplicated: Secondary | ICD-10-CM | POA: Diagnosis present

## 2017-05-01 DIAGNOSIS — F1020 Alcohol dependence, uncomplicated: Secondary | ICD-10-CM | POA: Diagnosis present

## 2017-05-01 DIAGNOSIS — F1410 Cocaine abuse, uncomplicated: Secondary | ICD-10-CM | POA: Diagnosis not present

## 2017-05-01 HISTORY — DX: Other psychoactive substance abuse, uncomplicated: F19.10

## 2017-05-01 HISTORY — DX: Alcohol use, unspecified with withdrawal, unspecified: F10.939

## 2017-05-01 HISTORY — DX: Rhabdomyolysis: M62.82

## 2017-05-01 HISTORY — DX: Alcohol dependence with withdrawal, unspecified: F10.239

## 2017-05-01 HISTORY — DX: Cocaine abuse, uncomplicated: F14.10

## 2017-05-01 LAB — RAPID URINE DRUG SCREEN, HOSP PERFORMED
Amphetamines: NOT DETECTED
BARBITURATES: NOT DETECTED
Benzodiazepines: NOT DETECTED
COCAINE: POSITIVE — AB
Opiates: NOT DETECTED
TETRAHYDROCANNABINOL: NOT DETECTED

## 2017-05-01 LAB — COMPREHENSIVE METABOLIC PANEL
ALK PHOS: 68 U/L (ref 38–126)
ALT: 21 U/L (ref 17–63)
AST: 45 U/L — AB (ref 15–41)
Albumin: 4.3 g/dL (ref 3.5–5.0)
Anion gap: 12 (ref 5–15)
BUN: 17 mg/dL (ref 6–20)
CHLORIDE: 103 mmol/L (ref 101–111)
CO2: 23 mmol/L (ref 22–32)
CREATININE: 1.22 mg/dL (ref 0.61–1.24)
Calcium: 9.2 mg/dL (ref 8.9–10.3)
GFR calc Af Amer: >60 mL/min (ref >60)
GFR calc non Af Amer: >60 mL/min (ref >60)
GLUCOSE: 77 mg/dL (ref 65–99)
Potassium: 3.6 mmol/L (ref 3.5–5.1)
SODIUM: 138 mmol/L (ref 135–145)
Total Bilirubin: 1.3 mg/dL — ABNORMAL HIGH (ref 0.3–1.2)
Total Protein: 7.4 g/dL (ref 6.5–8.1)

## 2017-05-01 LAB — CK
Total CK: 1750 U/L — ABNORMAL HIGH (ref 49–397)
Total CK: 1268 U/L — ABNORMAL HIGH (ref 49–397)

## 2017-05-01 LAB — CBC
HCT: 44.3 % (ref 39.0–52.0)
Hemoglobin: 16.4 g/dL (ref 13.0–17.0)
MCH: 33.7 pg (ref 26.0–34.0)
MCHC: 37 g/dL — AB (ref 30.0–36.0)
MCV: 91 fL (ref 78.0–100.0)
Platelets: 213 10*3/uL (ref 150–400)
RBC: 4.87 MIL/uL (ref 4.22–5.81)
RDW: 12.9 % (ref 11.5–15.5)
WBC: 6.1 10*3/uL (ref 4.0–10.5)

## 2017-05-01 LAB — ETHANOL: Alcohol, Ethyl (B): <10 mg/dL (ref <10)

## 2017-05-01 MED ORDER — ACETAMINOPHEN 325 MG PO TABS
650.0000 mg | ORAL_TABLET | Freq: Four times a day (QID) | ORAL | Status: DC | PRN
  Administered 2017-05-03: 650 mg via ORAL
  Filled 2017-05-01: qty 2

## 2017-05-01 MED ORDER — SODIUM CHLORIDE 0.9 % IV BOLUS (SEPSIS)
1000.0000 mL | Freq: Once | INTRAVENOUS | Status: AC
  Administered 2017-05-01: 1000 mL via INTRAVENOUS

## 2017-05-01 MED ORDER — ACETAMINOPHEN 650 MG RE SUPP: 650.0000 mg | Freq: Four times a day (QID) | RECTAL | Status: DC | PRN

## 2017-05-01 MED ORDER — LACOSAMIDE 50 MG PO TABS
50.0000 mg | ORAL_TABLET | Freq: Two times a day (BID) | ORAL | Status: DC
Start: 2017-05-01 — End: 2017-05-03
  Administered 2017-05-01 – 2017-05-03 (×5): 50 mg via ORAL
  Filled 2017-05-01 (×5): qty 1

## 2017-05-01 MED ORDER — THIAMINE HCL 100 MG/ML IJ SOLN
Freq: Every day | INTRAVENOUS | Status: DC
  Administered 2017-05-01 – 2017-05-02 (×2): via INTRAVENOUS
  Filled 2017-05-01 (×6): qty 1000

## 2017-05-01 MED ORDER — ENOXAPARIN SODIUM 40 MG/0.4ML ~~LOC~~ SOLN
40.0000 mg | SUBCUTANEOUS | Status: DC
  Administered 2017-05-01 – 2017-05-03 (×3): 40 mg via SUBCUTANEOUS
  Filled 2017-05-01 (×3): qty 0.4

## 2017-05-01 MED ORDER — FENTANYL CITRATE (PF) 100 MCG/2ML IJ SOLN
25.0000 ug | Freq: Once | INTRAMUSCULAR | Status: AC
  Administered 2017-05-01: 25 ug via INTRAVENOUS
  Filled 2017-05-01: qty 2

## 2017-05-01 MED ORDER — ONDANSETRON HCL 4 MG/2ML IJ SOLN: 4.0000 mg | Freq: Four times a day (QID) | INTRAMUSCULAR | Status: DC | PRN

## 2017-05-01 MED ORDER — IBUPROFEN 200 MG PO TABS: 600.0000 mg | ORAL_TABLET | Freq: Four times a day (QID) | ORAL | Status: DC | PRN

## 2017-05-01 MED ORDER — ONDANSETRON HCL 4 MG PO TABS: 4.0000 mg | ORAL_TABLET | Freq: Four times a day (QID) | ORAL | Status: DC | PRN

## 2017-05-01 MED ORDER — ADULT MULTIVITAMIN W/MINERALS CH: 1.0000 | ORAL_TABLET | Freq: Every day | ORAL | Status: DC

## 2017-05-01 MED ORDER — POTASSIUM CHLORIDE IN NACL 20-0.9 MEQ/L-% IV SOLN
INTRAVENOUS | Status: DC
  Administered 2017-05-01: 23:00:00 via INTRAVENOUS
  Filled 2017-05-01 (×2): qty 1000

## 2017-05-01 MED ORDER — BACLOFEN 10 MG PO TABS
10.0000 mg | ORAL_TABLET | Freq: Two times a day (BID) | ORAL | Status: DC
  Administered 2017-05-01 – 2017-05-03 (×5): 10 mg via ORAL
  Filled 2017-05-01 (×6): qty 1

## 2017-05-01 MED ORDER — LORAZEPAM 2 MG/ML IJ SOLN: 1.0000 mg | INTRAMUSCULAR | Status: DC | PRN

## 2017-05-01 NOTE — ED Notes (Signed)
Ambulated patient. Patient complaining of left leg pain when attempting to walk. EDP aware

## 2017-05-01 NOTE — ED Provider Notes (Signed)
[facility] Provider Note   CSN: [id number] Arrival date & time: 05/01/17  0145     History   Chief Complaint Chief Complaint  Patient presents with  . Addiction Problem   LEVEL 5 CAVEAT DUE TO ALTERED MENTAL STATUS  HPI Samuel Atkinson is a 51 y.o. male.  The history is provided by the patient.  Mental Health Problem  Presenting symptoms comment:  Drug abuse  Degree of incapacity (severity):  Moderate Onset quality:  Gradual Timing:  Constant Progression:  Worsening Chronicity:  New Context: alcohol use and drug abuse   Relieved by:  Nothing Worsened by:  Nothing Patient reports he drank took much alcohol and used several hundred dollars worth of cocaine He reports body aches He reports his left LE is numbness   Past Medical History:  Diagnosis Date  . AVM (arteriovenous malformation)   . Hx of substance abuse   . Hypertension   . Seizure disorder (HCC)   . Seizures [facility](HCC)     Patient Active Problem List   Diagnosis Date Noted  . Alcohol withdrawal syndrome without complication (HCC)   . Polysubstance abuse (HCC)   . Benign essential HTN 09/03/2016  . Major depressive disorder 09/10/2014  . Cocaine abuse (HCC) 09/10/2014  . Alcohol dependence (HCC) 09/10/2014  . Alcohol abuse 09/09/2014  . Substance induced mood disorder (HCC) 09/09/2014  . Cocaine use disorder, severe, dependence (HCC) 09/07/2014  . Suicidal ideation   . Seizures (HCC) 02/09/2013  . Cerebral AVM 02/09/2013    Past Surgical History:  Procedure Laterality Date  . AVM Embolization  2013       Home Medications    Prior to Admission medications   Medication Sig Start Date End Date Taking? Authorizing Provider  baclofen (LIORESAL) 10 MG tablet TK 1 T PO  WITH FOOD OR MILK TID. 07/07/16   [provider]  lacosamide (VIMPAT) 50 MG TABS tablet Take 1 tablet (50 mg total) by mouth 2 (two) times daily. 02/12/17   Butch PennyMillikan, Megan, NP    Multiple Vitamin (MULTIVITAMIN WITH MINERALS) TABS tablet Take 1 tablet by mouth daily. 09/11/14   Thermon Leylandavis, Laura A, NP    Family History Family History  Problem Relation Age of Onset  . Hypertension Father   . Coronary artery disease Father   . Alcohol abuse Father   . Diabetes Mother   . Hypertension Mother   . Healthy Brother   . Coronary artery disease Brother   . Drug abuse Brother   . Drug abuse Brother   . Coronary artery disease Brother     Social History Social History  Substance Use Topics  . Smoking status: Current Every Day Smoker    Packs/day: 1.00    Types: Cigarettes  . Smokeless tobacco: Never Used  . Alcohol use Yes     Comment: Last used 09/02/2016     Allergies   Latex   Review of Systems Review of Systems  Unable to perform ROS: Mental status change  Musculoskeletal: Positive for myalgias.  Neurological: Positive for numbness.     Physical Exam Updated Vital Signs BP 119/65   Pulse 95   Temp 98.2 F (36.8 C) (Oral)   Resp 16   Ht 1.829 m (6')   Wt 89.8 kg (198 lb)   SpO2 97%   BMI 26.85 kg/m   Physical Exam CONSTITUTIONAL: disheveled, wrapped in blankets, sleeping on arrival to room HEAD: Normocephalic/atraumatic EYES: EOMI/PERRL ENMT: Mucous membranes moist NECK:  supple no meningeal signs SPINE/BACK:entire spine nontender CV: S1/S2 noted, no murmurs/rubs/gallops noted LUNGS: Lungs are clear to auscultation bilaterally, no apparent distress ABDOMEN: soft, nontender  NEURO: Pt is sleeping on arrival.  He arouses to voice and follows commands but drifts back to sleep.  He is able to move all extremities without focal weakness but reports numbness to left LE  EXTREMITIES: pulses normal/equal, full ROM SKIN: warm, color normal PSYCH: unable to assess  ED Treatments / Results  Labs (all labs ordered are listed, but only abnormal results are displayed) Labs Reviewed  COMPREHENSIVE METABOLIC PANEL - Abnormal; Notable for the  following:       Result Value   AST 45 (*)    Total Bilirubin 1.3 (*)    All other components within normal limits  CBC - Abnormal; Notable for the following:    MCHC 37.0 (*)    All other components within normal limits  RAPID URINE DRUG SCREEN, HOSP PERFORMED - Abnormal; Notable for the following:    Cocaine POSITIVE (*)    All other components within normal limits  CK - Abnormal; Notable for the following:    Total CK 1,750 (*)    All other components within normal limits  CK - Abnormal; Notable for the following:    Total CK 1,268 (*)    All other components within normal limits  ETHANOL    EKG  EKG Interpretation  Date/Time:  Friday May 01 2017 01:55:40 EDT Ventricular Rate:  105 PR Interval:  118 QRS Duration: 82 QT Interval:  336 QTC Calculation: 444 R Axis:   47 Text Interpretation:  Sinus tachycardia Otherwise normal ECG No significant change since last tracing Confirmed by Zadie Rhine ([id number]) on 05/01/2017 3:18:30 AM       Radiology No results found.  Procedures Procedures (including critical care time)  Medications Ordered in ED Medications  fentaNYL (SUBLIMAZE) injection 25 mcg (not administered)  sodium chloride 0.9 % bolus 1,000 mL (not administered)  sodium chloride 0.9 % bolus 1,000 mL (0 mLs Intravenous Stopped 05/01/17 0625)  sodium chloride 0.9 % bolus 1,000 mL (0 mLs Intravenous Stopped 05/01/17 0625)     Initial Impression / Assessment and Plan / ED Course  I have reviewed the triage vital signs and the nursing notes.  Pertinent labs   results that were available during my care of the patient were reviewed by me and considered in my medical decision making (see chart for details).     3:57 AM Pt in the ED for drug abuse He is very drowsy which makes history difficult He reports myalgias Will check CK Will let sleep and reassess Vitals appropriate 8:05 AM Pt monitored for several hrs He is more alert He reports diffuse body  pain, but also left LE numbness but there is no focal weakness His CK was elevated with some improvement from 2 liters of NS Due to persistent pain, difficulty ambulating without nurse assist, will admit  D/w hospitalist for admission  Final Clinical Impressions(s) / ED Diagnoses   Final diagnoses:  Non-traumatic rhabdomyolysis  Cocaine abuse (HCC)  Myalgia    New Prescriptions New Prescriptions   No medications on file     Zadie Rhine, MD 05/01/17 [phone]

## 2017-05-01 NOTE — ED Notes (Signed)
Edp aware of patient left leg and left arm pain.

## 2017-05-01 NOTE — ED Triage Notes (Signed)
Pt states he had to much ETOH on board and took a lot of cocaine 2 hours ago $294.00 in cocaine, pt is very drowsy on triage but responding very well to questions. No respiratory distress.

## 2017-05-01 NOTE — H&P (Signed)
History and Physical    Samuel SingDavid Cuccia VWU:[id number] DOB: 03/15/1966 DOA: 05/01/2017  PCP: Gentry FitzUnassigned (patient reports that he sees Dr. Abigail Miyamotohacker however he has had no contact with him in 5 years per review of chart) Patient coming from: Home  I have personally briefly [facility]  Chief Complaint: 2-hour history of "too much" alcohol and cocaine use presents due to muscle pain  HPI: Samuel Atkinson is a 51 y.o. male with medical history significant of alcohol withdrawal syndrome without complication, polysubstance abuse, hypertension, cocaine abuse, alcohol dependence, seizures, cerebral AVM status post embolization who presents with complaints of drug abuse and myalgias.  He states he took a lot of cocaine and alcohol 2 hours prior to admission is now having severe myalgia.  He was rested in the emergency department from approximately 4 AM to 8 AM.  By 8 AM he was more alert but complains of diffuse body pain and left lower extremity numbness with no focal weakness.  He ambulated in the emergency department with obvious difficulty that did not appear to be magnified.  There is concern regarding a history of alcohol withdrawal.   He had an initially elevated CK at 1750 which has improved to 1268 on recheck.  He states his drug abuse is moderate and has gradually worsened.  It is constant.  It is relieved by nothing and improved by nothing and the concern is that he has had new combination drug use of alcohol and cocaine.  Given his multiple chronic medical problems including a history of alcohol withdrawal patient will be observed in the hospital overnight and monitored for alcohol withdrawal as well as treatment for acute cocaine induced rhabdomyolysis.    Review of Systems  Constitutional: Negative for chills and fever.  HENT: Negative for ear discharge and hearing loss.   Eyes: Negative for blurred vision and double vision.  Respiratory: Negative for  sputum production, shortness of breath and wheezing.   Cardiovascular: Negative for chest pain, palpitations and orthopnea.  Gastrointestinal: Negative for abdominal pain, heartburn, nausea and vomiting.  Genitourinary: Negative for dysuria, frequency and urgency.  Musculoskeletal: Positive for myalgias. Negative for back pain and neck pain.  Skin: Negative for itching and rash.  Neurological: Negative for dizziness, tingling, focal weakness, weakness and headaches.  Endo/Heme/Allergies: Negative for environmental allergies. Does not bruise/bleed easily.  Psychiatric/Behavioral: Positive for substance abuse (cociane and alcohol). Negative for depression, hallucinations, memory loss and suicidal ideas. The patient is not nervous/anxious and does not have insomnia.   All other systems reviewed and are negative.    Past Medical History:  Diagnosis Date  . AVM (arteriovenous malformation)   . Hx of substance abuse   . Hypertension   . Seizure disorder (HCC)   . Seizures (HCC)     Past Surgical History:  Procedure Laterality Date  . AVM Embolization  2013    Social history: Drinks alcohol as much and often as he can.  Smoked $293 worth of cocaine today.  Uses cocaine as frequently as he can.  Smokes about a pack of cigarettes per day   reports that he has been smoking Cigarettes.  He has been smoking about 1.00 pack per day. He has never used smokeless tobacco. He reports that he drinks alcohol. He reports that he uses drugs, including Cocaine.  Allergies  Allergen Reactions  . Latex Rash    Family History  Problem Relation Age of Onset  . Hypertension Father   .  Coronary artery disease Father   . Alcohol abuse Father   . Diabetes Mother   . Hypertension Mother   . Healthy Brother   . Coronary artery disease Brother   . Drug abuse Brother   . Drug abuse Brother   . Coronary artery disease Brother     Prior to Admission medications   Medication Sig Start Date End Date  Taking? Authorizing Provider  baclofen (LIORESAL) 10 MG tablet TK 1 T PO  WITH FOOD OR MILK TID. 07/07/16   [provider]  lacosamide (VIMPAT) 50 MG TABS tablet Take 1 tablet (50 mg total) by mouth 2 (two) times daily. 02/12/17   Butch Penny, NP  Multiple Vitamin (MULTIVITAMIN WITH MINERALS) TABS tablet Take 1 tablet by mouth daily. 09/11/14   Thermon Leyland, NP    Physical Exam: Vitals:   05/01/17 0815 05/01/17 0830 05/01/17 0845 05/01/17 0900  BP: 112/69 122/68  116/68  Pulse: 91 81 82 91  Resp: 16 11 11 15   Temp:      TempSrc:      SpO2: 96% 91% 97% 96%  Weight:      Height:        Constitutional: NAD, calm, comfortable Vitals:   05/01/17 0815 05/01/17 0830 05/01/17 0845 05/01/17 0900  BP: 112/69 122/68  116/68  Pulse: 91 81 82 91  Resp: 16 11 11 15   Temp:      TempSrc:      SpO2: 96% 91% 97% 96%  Weight:      Height:       Physical Exam  Constitutional: He is oriented to person, place, and time and well-developed, well-nourished, and in no distress. No distress.  HENT:  Head: Normocephalic and atraumatic.  Mouth/Throat: No oropharyngeal exudate.  Eyes: Pupils are equal, round, and reactive to light. Conjunctivae and EOM are normal.  Neck: Normal range of motion. Neck supple. No JVD present. No tracheal deviation present. No thyromegaly present.  Cardiovascular: Normal rate and regular rhythm.  Exam reveals no friction rub.   No murmur heard. Pulmonary/Chest: Effort normal and breath sounds normal. No respiratory distress. He has no wheezes. He has no rales. He exhibits no tenderness.  Abdominal: Soft. Bowel sounds are normal. He exhibits no distension. There is no tenderness. There is no rebound and no guarding.  Musculoskeletal: Normal range of motion. He exhibits no edema, tenderness or deformity.  Neurological: He is alert and oriented to person, place, and time. He has normal reflexes.  Gait with antalgia  Skin: Skin is warm and dry.  Psychiatric:  Affect and judgment normal.  Nursing note and vitals reviewed.  Labs on Admission: I have personally reviewed following labs and imaging studies  CBC:  Recent Labs Lab 05/01/17 0200  WBC 6.1  HGB 16.4  HCT 44.3  MCV 91.0  PLT 213   Basic Metabolic Panel:  Recent Labs Lab 05/01/17 0200  NA 138  K 3.6  CL 103  CO2 23  GLUCOSE 77  BUN 17  CREATININE 1.22  CALCIUM 9.2   GFR: Estimated Creatinine Clearance: 78.6 mL/min (by C-G formula based on SCr of 1.22 mg/dL). Liver Function Tests:  Recent Labs Lab 05/01/17 0200  AST 45*  ALT 21  ALKPHOS 68  BILITOT 1.3*  PROT 7.4  ALBUMIN 4.3   No results for input(s): LIPASE, AMYLASE in the last 168 hours. No results for input(s): AMMONIA in the last 168 hours. Coagulation Profile: No results for input(s): INR, PROTIME in the  last 168 hours. Cardiac Enzymes:  Recent Labs Lab 05/01/17 0329 05/01/17 0555  CKTOTAL 1,750* 1,268*   BNP (last 3 results) No results for input(s): PROBNP in the last 8760 hours. HbA1C: No results for input(s): HGBA1C in the last 72 hours. CBG: No results for input(s): GLUCAP in the last 168 hours. Lipid Profile: No results for input(s): CHOL, HDL, LDLCALC, TRIG, CHOLHDL, LDLDIRECT in the last 72 hours. Thyroid Function Tests: No results for input(s): TSH, T4TOTAL, FREET4, T3FREE, THYROIDAB in the last 72 hours. Anemia Panel: No results for input(s): VITAMINB12, FOLATE, FERRITIN, TIBC, IRON, RETICCTPCT in the last 72 hours. Urine analysis:    Component Value Date/Time   COLORURINE YELLOW 10/26/2015 1948   APPEARANCEUR CLEAR 10/26/2015 1948   LABSPEC 1.023 10/26/2015 1948   PHURINE 5.5 10/26/2015 1948   GLUCOSEU NEGATIVE 10/26/2015 1948   HGBUR NEGATIVE 10/26/2015 1948   BILIRUBINUR NEGATIVE 10/26/2015 1948   KETONESUR NEGATIVE 10/26/2015 1948   PROTEINUR NEGATIVE 10/26/2015 1948   UROBILINOGEN 1.0 10/26/2015 1850   NITRITE NEGATIVE 10/26/2015 1948   LEUKOCYTESUR NEGATIVE  10/26/2015 1948    Radiological Exams on Admission: No results found.  EKG: Independently reviewed.  Sinus tachycardia with normal axes and intervals  Assessment/Plan Principal Problem:   Non-traumatic rhabdomyolysis Active Problems:   Alcohol abuse   Cocaine abuse (HCC)   Seizures (HCC)   Cocaine use disorder, severe, dependence (HCC)   Alcohol dependence (HCC)   Benign essential HTN    1.  Nontraumatic rhabdomyolysis: Brought on by excessive cocaine use.  Will observe the patient into the hospital and provide analgesia with nonnarcotic medications.  We will give him IV fluids and monitor renal function.  Likely patient can be discharged in a.m.  2.  Alcohol abuse: Patient with excessive alcohol drinking by his own admission.  We will provide him with as needed Ativan if he becomes more agitated may require call withdrawal protocol.  Likely discharge in a.m. and hopefully will not enter into alcohol withdrawal as his last drink was sometime after midnight this morning.  3.  Cocaine abuse:  Likely cause of patient's rhabdomyolysis.  Will ask social work to give him information regarding drug programs.  4.  Cocaine use disorder severe dependence: Patient apparently has had some evaluation by psychiatry in the past.  Not sure that this will be helpful at the present time.  Would recommend outpatient evaluation with drug rehab program.  5.  Alcohol dependence: Would recommend outpatient rehab program if patient is interested.  6.  Benign essential hypertension: Blood pressures currently well controlled.  Is not on any  outpatient blood pressure medications.  Will monitor while in hospital.  DVT prophylaxis: Lovenox Code Status: Full code Family Communication: Patient is able to make decisions for himself.  No family present with him. Disposition Plan: Likely discharge in a.m. Consults called: None Admission status: Observation   Lahoma Crocker MD FACP [facility] Pager [phone]  If 7PM-7AM, please contact night-coverage [email or web address] Password [facility]  05/01/2017, 10:29 AM

## 2017-05-02 DIAGNOSIS — M6282 Rhabdomyolysis: Secondary | ICD-10-CM | POA: Diagnosis not present

## 2017-05-02 DIAGNOSIS — F1010 Alcohol abuse, uncomplicated: Secondary | ICD-10-CM

## 2017-05-02 DIAGNOSIS — M791 Myalgia, unspecified site: Secondary | ICD-10-CM | POA: Diagnosis not present

## 2017-05-02 DIAGNOSIS — I10 Essential (primary) hypertension: Secondary | ICD-10-CM

## 2017-05-02 DIAGNOSIS — F1410 Cocaine abuse, uncomplicated: Secondary | ICD-10-CM

## 2017-05-02 DIAGNOSIS — R569 Unspecified convulsions: Secondary | ICD-10-CM | POA: Diagnosis not present

## 2017-05-02 LAB — BASIC METABOLIC PANEL
Anion gap: 6 (ref 5–15)
BUN: 10 mg/dL (ref 6–20)
CO2: 23 mmol/L (ref 22–32)
Calcium: 8.1 mg/dL — ABNORMAL LOW (ref 8.9–10.3)
Chloride: 110 mmol/L (ref 101–111)
Creatinine, Ser: 1.12 mg/dL (ref 0.61–1.24)
GFR calc non Af Amer: >60 mL/min (ref >60)
Glucose, Bld: 102 mg/dL — ABNORMAL HIGH (ref 65–99)
Potassium: 4 mmol/L (ref 3.5–5.1)
Sodium: 139 mmol/L (ref 135–145)

## 2017-05-02 LAB — CK: Total CK: 865 U/L — ABNORMAL HIGH (ref 49–397)

## 2017-05-02 NOTE — Progress Notes (Signed)
PROGRESS NOTE    Samuel Atkinson  ZOX:[id number] DOB: 09-Apr-1966 DOA: 05/01/2017 PCP: [name], MD   Brief Narrative: Samuel Atkinson is a 51 y.o. male with medical history significant of alcohol withdrawal syndrome without complication, polysubstance abuse, hypertension, cocaine abuse, alcohol dependence, seizures, cerebral AVM status post embolization. He presented with rhabdomyolysis secondary to cocaine.   Assessment & Plan:   Principal Problem:   Non-traumatic rhabdomyolysis Active Problems:   Seizures (HCC)   Cocaine use disorder, severe, dependence (HCC)   Alcohol abuse   Cocaine abuse (HCC)   Alcohol dependence (HCC)   Benign essential HTN   Nontraumatic rhabdomyolysis Secondary to excessive cocaine use.  CK trended down overnight.  Patient with persistent muscle soreness and some mild weakness.  Renal function has improved overnight.  Patient counseled on cocaine use. Still unable to ambulate. -continue to watch overnight  Alcohol abuse Patient was started on CIWA protocol..  No signs of withdrawal.  Cocaine abuse Social work consulted.  Benign essential hypertension Blood pressure normotensive.   DVT prophylaxis: Lovenox Code Status: Full code Family Communication: None at bedside Disposition Plan: Discharge tomorrow once able to ambulate   Consultants:   None  Procedures:   None  Antimicrobials:  None    Subjective: Still has muscle soreness and weakness  Objective: Vitals:   05/01/17 1114 05/01/17 2100 05/02/17 0559 05/02/17 1300  BP: (!) 117/59 (!) 100/56 (!) 136/49 (!) 113/57  Pulse: 80 73 64 64  Resp: 16 14 14 16   Temp: 97.9 F (36.6 C) 98.1 F (36.7 C) 98.8 F (37.1 C) 98.4 F (36.9 C)  TempSrc: Oral Oral Oral Oral  SpO2: 100% 94% 93% 99%  Weight:      Height:        Intake/Output Summary (Last 24 hours) at 05/02/17 1431 Last data filed at 05/02/17 1300  Gross per 24 hour  Intake          2532.09 ml  Output              1500 ml  Net          1032.09 ml   Filed Weights   05/01/17 0150  Weight: 89.8 kg (198 lb)    Examination:  General exam: Appears calm and comfortable Respiratory system: Clear to auscultation. Respiratory effort normal. Cardiovascular system: S1 & S2 heard, RRR. No murmurs, rubs, gallops or clicks. Gastrointestinal system: Abdomen is nondistended, soft and nontender. No organomegaly or masses felt. Normal bowel sounds heard. Central nervous system: Alert and oriented. No focal neurological deficits. Extremities: No edema. Tenderness of forearms and thighs bilaterally Skin: No cyanosis. No rashes Psychiatry: Judgement and insight appear normal. Mood & affect appropriate.     Data Reviewed: I have personally reviewed following labs and imaging studies  CBC:  Recent Labs Lab 05/01/17 0200  WBC 6.1  HGB 16.4  HCT 44.3  MCV 91.0  PLT 213   Basic Metabolic Panel:  Recent Labs Lab 05/01/17 0200 05/02/17 0531  NA 138 139  K 3.6 4.0  CL 103 110  CO2 23 23  GLUCOSE 77 102*  BUN 17 10  CREATININE 1.22 1.12  CALCIUM 9.2 8.1*   GFR: Estimated Creatinine Clearance: 85.6 mL/min (by C-G formula based on SCr of 1.12 mg/dL). Liver Function Tests:  Recent Labs Lab 05/01/17 0200  AST 45*  ALT 21  ALKPHOS 68  BILITOT 1.3*  PROT 7.4  ALBUMIN 4.3   No results for input(s): LIPASE, AMYLASE in the last 168  hours. No results for input(s): AMMONIA in the last 168 hours. Coagulation Profile: No results for input(s): INR, PROTIME in the last 168 hours. Cardiac Enzymes:  Recent Labs Lab 05/01/17 0329 05/01/17 0555 05/02/17 0930  CKTOTAL 1,750* 1,268* 865*   BNP (last 3 results) No results for input(s): PROBNP in the last 8760 hours. HbA1C: No results for input(s): HGBA1C in the last 72 hours. CBG: No results for input(s): GLUCAP in the last 168 hours. Lipid Profile: No results for input(s): CHOL, HDL, LDLCALC, TRIG, CHOLHDL, LDLDIRECT in the last 72  hours. Thyroid Function Tests: No results for input(s): TSH, T4TOTAL, FREET4, T3FREE, THYROIDAB in the last 72 hours. Anemia Panel: No results for input(s): VITAMINB12, FOLATE, FERRITIN, TIBC, IRON, RETICCTPCT in the last 72 hours. Sepsis Labs: No results for input(s): PROCALCITON, LATICACIDVEN in the last 168 hours.  No results found for this or any previous visit (from the past 240 hour(s)).       Radiology Studies: No results found.      Scheduled Meds: . baclofen  10 mg Oral BID  . enoxaparin (LOVENOX) injection  40 mg Subcutaneous Q24H  . lacosamide  50 mg Oral BID   Continuous Infusions: . banana bag IV 1000 mL 125 mL/hr at 05/02/17 1327     LOS: 0 days     Jacquelin Hawkingalph Dae Highley, MD [facility] 05/02/2017, 2:31 PM Pager: [phone]  If 7PM-7AM, please contact night-coverage [email or web address] Password [email or web address] 05/02/2017, 2:31 PM

## 2017-05-02 NOTE — Progress Notes (Addendum)
Physical Therapy Evaluation Patient Details Name: Samuel Atkinson MRN: [id number] DOB: 1965/08/25 Today's Date: 05/02/2017   History of Present Illness  Samuel Atkinson is a 51 y/o male admitted on 05/01/17 for myalgia and L LE numbness follonwing alcohol and cocaine use. Patient with a PMHx significant for alcohol withdrawal syndrome without complication, polysubstance abuse, hypertension, cocaine abuse, alcohol dependence, seizures, cerebral AVM status post embolization   Clinical Impression  PT receiving orders for eval and treat. Patient today functioning a Mod I level for all bed mobility and Min Guard for transfers and ambulation on level surface as well as stairs simulating home environment. PT recommending a RW at this time as patient demonstrates reduced tendency to weight shift onto L LE due to myalgia and subjective reports of L LE numbness, however, with heavy VC able to demonstrate good heel toe gait mechanics intermittently. No episode of LOB today with ambulation, but with multiple stand rest breaks due to pain. PT recommending no PT follow up at this time due to current functional levels and hopeful for quick recovery of function. PT does recommend d/c to safe environment as well as community resources to allow for sobriety and possible drug counseling.     Follow Up Recommendations No PT follow up    Equipment Recommendations  Rolling walker with 5" wheels    Recommendations for Other Services       Precautions / Restrictions Precautions Precautions: Fall Restrictions Weight Bearing Restrictions: No      Mobility  Bed Mobility Overal bed mobility: Modified Independent                Transfers Overall transfer level: Needs assistance Equipment used: Rolling walker (2 wheeled) Transfers: Sit to/from Stand Sit to Stand: Min guard         General transfer comment: VC for hand placement for general safety  Ambulation/Gait Ambulation/Gait assistance: Min  guard Ambulation Distance (Feet): 120 Feet Assistive device: Rolling walker (2 wheeled) Gait Pattern/deviations: Step-to pattern;Decreased stride length;Decreased weight shift to left;Antalgic Gait velocity: slow Gait velocity interpretation: Below normal speed for age/gender General Gait Details: multiple VC to put weight through L LE with some carryover - patient reporting pain limiting WB  Stairs Stairs: Yes Stairs assistance: Min guard Stair Management: No rails;Step to pattern;With walker Number of Stairs: 3 General stair comments: backwards up stairs, forwards down stairs  Wheelchair Mobility    Modified Rankin (Stroke Patients Only)       Balance Overall balance assessment: Modified Independent                                           Pertinent Vitals/Pain Pain Assessment: 0-10 Pain Score: 7  Pain Location: B LE and B UE Pain Descriptors / Indicators: Aching;Constant;Grimacing Pain Intervention(s): Limited activity within patient's tolerance;Monitored during session;Repositioned    Home Living Family/patient expects to be discharged to:: Private residence Living Arrangements: Spouse/significant other   Type of Home: House Home Access: Stairs to enter Entrance Stairs-Rails: None Secretary/administrator of Steps: 3 Home Layout: One level Home Equipment: None      Prior Function Level of Independence: Independent         Comments: reports he was not wokring prior to admission     Hand Dominance        Extremity/Trunk Assessment   Upper Extremity Assessment Upper Extremity Assessment: Overall WFL for tasks assessed  Lower Extremity Assessment Lower Extremity Assessment: Generalized weakness (slight L LE weakness)    Cervical / Trunk Assessment Cervical / Trunk Assessment: Normal  Communication   Communication: No difficulties  Cognition Arousal/Alertness: Awake/alert Behavior During Therapy: WFL for tasks  assessed/performed Overall Cognitive Status: Within Functional Limits for tasks assessed                                 General Comments: concerned about IV site pain - PT notified nursing      General Comments      Exercises     Assessment/Plan    PT Assessment Patent does not need any further PT services  PT Problem List Decreased strength;Decreased activity tolerance;Decreased mobility;Decreased knowledge of use of DME;Decreased safety awareness;Pain       PT Treatment Interventions (P) DME instruction;Gait training;Stair training;Functional mobility training;Therapeutic activities;Therapeutic exercise;Balance training;Neuromuscular re-education;Patient/family education    PT Goals (Current goals can be found in the Care Plan section)  Acute Rehab PT Goals Patient Stated Goal: go home PT Goal Formulation: With patient Time For Goal Achievement: 05/16/17 Potential to Achieve Goals: Good    Frequency (P) Min 3X/week   Barriers to discharge Decreased caregiver support      Co-evaluation               AM-PAC PT "6 Clicks" Daily Activity  Outcome Measure Difficulty turning over in bed (including adjusting bedclothes, sheets and blankets)?: None Difficulty moving from lying on back to sitting on the side of the bed? : None Difficulty sitting down on and standing up from a chair with arms (e.g., wheelchair, bedside commode, etc,.)?: None Help needed moving to and from a bed to chair (including a wheelchair)?: A Little Help needed walking in hospital room?: A Little Help needed climbing 3-5 steps with a railing? : A Little 6 Click Score: 21    End of Session Equipment Utilized During Treatment: Gait belt Activity Tolerance: Patient tolerated treatment well Patient left: in chair;with call bell/phone within reach Nurse Communication: Mobility status PT Visit Diagnosis: Unsteadiness on feet (R26.81);Other abnormalities of gait and mobility  (R26.89);Muscle weakness (generalized) (M62.81)    Time: [id number] PT Time Calculation (min) (ACUTE ONLY): 37 min   Charges:   PT Evaluation $PT Eval Moderate Complexity: 1 Mod PT Treatments $Gait Training: 8-22 mins   PT G Codes:   PT G-Codes **NOT FOR INPATIENT CLASS** Functional Assessment Tool Used: AM-PAC 6 Clicks Basic Mobility Functional Limitation: Mobility: Walking and moving around Mobility: Walking and Moving Around Current Status ([id number](G8978): At least 20 percent but less than 40 percent impaired, limited or restricted Mobility: Walking and Moving Around Goal Status [phone](G8979): At least 1 percent but less than 20 percent impaired, limited or restricted    Kipp LaurenceStephanie R Aaron, PT, DPT 05/02/17 5:00 PM

## 2017-05-02 NOTE — Discharge Instructions (Addendum)
Rhabdomyolysis Rhabdomyolysis is a condition that happens when muscle cells break down and release substances into the blood that can damage the kidneys. This condition happens because of damage to the muscles that move bones (skeletal muscle). When the skeletal muscles are damaged, substances inside the muscle cells go into the blood. One of these substances is a protein called myoglobin. Large amounts of myoglobin can cause kidney damage or kidney failure. Other substances that are released by muscle cells may upset the balance of the minerals (electrolytes) in your blood. This imbalance causes your blood to have too much acid (acidosis). What are the causes? This condition is caused by muscle damage. Muscle damage often happens because of:  Using your muscles too much.  An injury that crushes or squeezes a muscle too tightly.  Using illegal drugs, mainly cocaine.  Alcohol abuse.  Other possible causes include:  Prescription medicines, such as those that: ? Lower cholesterol (statins). ? Treat ADHD (attention deficit hyperactivity disorder) or help with weight loss (amphetamines). ? Treat pain (opiates).  Infections.  Muscle diseases that are passed down from parent to child (inherited).  High fever.  Heatstroke.  Not having enough fluids in your body (dehydration).  Seizures.  Surgery.  What increases the risk? This condition is more likely to develop in people who:  Have a family history of muscle disease.  Take part in extreme sports, such as running in marathons.  Have diabetes.  Are older.  Abuse drugs or alcohol.  What are the signs or symptoms? Symptoms of this condition vary. Some people have very few symptoms, and other people have many symptoms. The most common symptoms include:  Muscle pain and swelling.  Weak muscles.  Dark urine.  Feeling weak and tired.  Other symptoms include:  Nausea and vomiting.  Fever.  Pain in the abdomen.  Pain  in the joints.  Symptoms of complications from this condition include:  Heart rhythm that is not normal (arrhythmia).  Seizures.  Not urinating enough because of kidney failure.  Very low blood pressure (shock). Signs of shock include dizziness, blurry vision, and clammy skin.  Bleeding that is hard to stop or control.  How is this diagnosed? This condition is diagnosed based on your medical history, your symptoms, and a physical exam. Tests may also be done, including:  Blood tests.  Urine tests to check for myoglobin.  You may also have other tests to check for causes of muscle damage and to check for complications. How is this treated? Treatment for this condition helps to:  Make sure you have enough fluids in your body.  Lower the acid levels in your blood to reverse acidosis.  Protect your kidneys.  Treatment may include:  Fluids and medicines given through an IV tube that is inserted into one of your veins.  Medicines to lower acidosis or to bring back the balance of the minerals in your body.  Hemodialysis. This treatment uses an artificial kidney machine to filter your blood while you recover. You may have this if other treatments are not helping.  Follow these instructions at home:  Take over-the-counter and prescription medicines only as told by your health care provider.  Rest at home until your health care provider says that you can return to your normal activities.  Drink enough fluid to keep your urine clear or pale yellow.  Do not do activities that take a lot of effort (are strenuous). Ask your health care provider what level of exercise is safe   for you.  Do not abuse drugs or alcohol. If you are having problems with drug or alcohol use, ask your health care provider for help.  Keep all follow-up visits as told by your health care provider. This is important. Contact a health care provider if:  You start having symptoms of this condition after  treatment. Get help right away if:  You have a seizure.  You bleed easily or cannot control bleeding.  You cannot urinate.  You have chest pain.  You have trouble breathing. This information is not intended to replace advice given to you by your health care provider. Make sure you discuss any questions you have with your health care provider. Document Released: 05/29/2004 Document Revised: 03/28/2016 Document Reviewed: 03/28/2016 Elsevier Interactive Patient Education  2018 [employer]  

## 2017-05-02 NOTE — Discharge Summary (Signed)
Physician Discharge Summary  Samuel SingDavid [name] ZOX:[id number] DOB: 03/01/1966 DOA: 05/01/2017  PCP: Henrine Screwshacker, Robert, MD  Admit date: 05/01/2017 Discharge date: 05/02/2017  Admitted From: Home Disposition: Home  Recommendations for Outpatient Follow-up:  1. Follow up with PCP in 1 week 2. Please obtain BMP/CBC/CK in one week 3. Outpatient substance abuse program 4. Please follow up on the following pending results: None  Home Health: None Equipment/Devices: Rolling walker  Discharge Condition: Stale CODE STATUS: Full code Diet recommendation: Heart healthy   Brief/Interim Summary:  Admission HPI written by Lahoma Crockerheresa C Sheehan, MD   Chief Complaint: 2-hour history of "too much" alcohol and cocaine use presents due to muscle pain  HPI: Samuel Atkinson is a 51 y.o. male with medical history significant of alcohol withdrawal syndrome without complication, polysubstance abuse, hypertension, cocaine abuse, alcohol dependence, seizures, cerebral AVM status post embolization who presents with complaints of drug abuse and myalgias.  He states he took a lot of cocaine and alcohol 2 hours prior to admission is now having severe myalgia.  He was rested in the emergency department from approximately 4 AM to 8 AM.  By 8 AM he was more alert but complains of diffuse body pain and left lower extremity numbness with no focal weakness.  He ambulated in the emergency department with obvious difficulty that did not appear to be magnified.  There is concern regarding a history of alcohol withdrawal.   He had an initially elevated CK at 1750 which has improved to 1268 on recheck.  He states his drug abuse is moderate and has gradually worsened.  It is constant.  It is relieved by nothing and improved by nothing and the concern is that he has had new combination drug use of alcohol and cocaine.  Given his multiple chronic medical problems including a history of alcohol withdrawal patient will be observed in the hospital  overnight and monitored for alcohol withdrawal as well as treatment for acute cocaine induced rhabdomyolysis.   Hospital course:  Nontraumatic rhabdomyolysis Secondary to excessive cocaine use.  CK trended down overnight.  Patient with persistent muscle soreness and some mild weakness.  Renal function has improved overnight.  Patient counseled on cocaine use.  Alcohol abuse Patient was started on CIWA protocol..  No signs of withdrawal.  Cocaine abuse Social work consulted for resources.  Benign essential hypertension Blood pressure normotensive.   Discharge Diagnoses:  Principal Problem:   Non-traumatic rhabdomyolysis Active Problems:   Seizures (HCC)   Cocaine use disorder, severe, dependence (HCC)   Alcohol abuse   Cocaine abuse (HCC)   Alcohol dependence (HCC)   Benign essential HTN    Discharge Instructions  Discharge Instructions    Diet - low sodium heart healthy    Complete by:  As directed    Increase activity slowly    Complete by:  As directed      Allergies as of 05/02/2017      Reactions   Latex Rash      Medication List    TAKE these medications   baclofen 10 MG tablet Commonly known as:  LIORESAL TK 1 T PO  WITH FOOD OR MILK TID.   gabapentin 100 MG capsule Commonly known as:  NEURONTIN Take 100 mg by mouth 2 (two) times daily.   lacosamide 50 MG Tabs tablet Commonly known as:  VIMPAT Take 50 mg by mouth 2 (two) times daily.   naproxen sodium 220 MG tablet Commonly known as:  ALEVE Take 220 mg by mouth 2 (  two) times daily as needed (pain).   omeprazole 40 MG capsule Commonly known as:  PRILOSEC Take 40 mg by mouth daily.   ondansetron 8 MG tablet Commonly known as:  ZOFRAN Take 8 mg by mouth every 8 (eight) hours as needed for nausea.   PROAIR HFA 108 (90 Base) MCG/ACT inhaler Generic drug:  albuterol Inhale 1 puff into the lungs every 4 (four) hours as needed for wheezing.      Follow-up Information    [name], MD.  Schedule an appointment as soon as possible for a visit in 1 week(s).   Specialty:  Family Medicine Contact information: [address], [address] [place] [address] [phone]          Allergies  Allergen Reactions  . Latex Rash    Consultations:  None   Procedures/Studies:  No results found.    Subjective: Forearm and thigh soreness today.  Discharge Exam: Vitals:   05/01/17 2100 05/02/17 0559  BP: (!) 100/56 (!) 136/49  Pulse: 73 64  Resp: 14 14  Temp: 98.1 F (36.7 C) 98.8 F (37.1 C)  SpO2: 94% 93%   Vitals:   05/01/17 1045 05/01/17 1114 05/01/17 2100 05/02/17 0559  BP: 119/68 (!) 117/59 (!) 100/56 (!) 136/49  Pulse: 80 80 73 64  Resp: 15 16 14 14   Temp:  97.9 F (36.6 C) 98.1 F (36.7 C) 98.8 F (37.1 C)  TempSrc:  Oral Oral Oral  SpO2: 93% 100% 94% 93%  Weight:      Height:        General exam: Appears calm and comfortable Respiratory system: Clear to auscultation. Respiratory effort normal. Cardiovascular system: S1 & S2 heard, RRR. Gastrointestinal system: Abdomen is nondistended, soft and nontender. Normal bowel sounds heard. Central nervous system: Alert and oriented. No focal neurological deficits. Extremities: No edema. Tenderness of forearms and thighs bilaterally Skin: No cyanosis. No rashes Psychiatry: Judgement and insight appear normal. Mood & affect appropriate.    The results of significant diagnostics from this hospitalization (including imaging, microbiology, ancillary and laboratory) are listed below for reference.      Labs: Basic Metabolic Panel:  Recent Labs Lab 05/01/17 0200 05/02/17 0531  NA 138 139  K 3.6 4.0  CL 103 110  CO2 23 23  GLUCOSE 77 102*  BUN 17 10  CREATININE 1.22 1.12  CALCIUM 9.2 8.1*   Liver Function Tests:  Recent Labs Lab 05/01/17 0200  AST 45*  ALT 21  ALKPHOS 68  BILITOT 1.3*  PROT 7.4  ALBUMIN 4.3   CBC:  Recent Labs Lab 05/01/17 0200  WBC 6.1  HGB 16.4  HCT  44.3  MCV 91.0  PLT 213   Cardiac Enzymes:  Recent Labs Lab 05/01/17 0329 05/01/17 0555 05/02/17 0930  CKTOTAL 1,750* 1,268* 865*   Urinalysis    Component Value Date/Time   COLORURINE YELLOW 10/26/2015 1948   APPEARANCEUR CLEAR 10/26/2015 1948   LABSPEC 1.023 10/26/2015 1948   PHURINE 5.5 10/26/2015 1948   GLUCOSEU NEGATIVE 10/26/2015 1948   HGBUR NEGATIVE 10/26/2015 1948   BILIRUBINUR NEGATIVE 10/26/2015 1948   KETONESUR NEGATIVE 10/26/2015 1948   PROTEINUR NEGATIVE 10/26/2015 1948   UROBILINOGEN 1.0 10/26/2015 1850   NITRITE NEGATIVE 10/26/2015 1948   LEUKOCYTESUR NEGATIVE 10/26/2015 1948    SIGNED:   Jacquelin Hawking, MD [facility] 05/02/2017, 11:15 AM Pager [phone]  If 7PM-7AM, please contact night-coverage [email or web address] Password [email or web address]

## 2017-05-02 NOTE — Clinical Social Work Note (Signed)
CSW acknowledges consult regarding possible need for rehab. PT order placed today. Please enter OT order as well. If recommended for SNF, insurance will require OT evaluation to review for authorization.  Charlynn CourtSarah Johny Pitstick, CSW [phone]

## 2017-05-03 DIAGNOSIS — M6282 Rhabdomyolysis: Secondary | ICD-10-CM | POA: Diagnosis not present

## 2017-05-03 DIAGNOSIS — R569 Unspecified convulsions: Secondary | ICD-10-CM

## 2017-05-03 DIAGNOSIS — F1410 Cocaine abuse, uncomplicated: Secondary | ICD-10-CM | POA: Diagnosis not present

## 2017-05-03 DIAGNOSIS — R269 Unspecified abnormalities of gait and mobility: Secondary | ICD-10-CM | POA: Diagnosis not present

## 2017-05-03 DIAGNOSIS — F1010 Alcohol abuse, uncomplicated: Secondary | ICD-10-CM | POA: Diagnosis not present

## 2017-05-03 DIAGNOSIS — I10 Essential (primary) hypertension: Secondary | ICD-10-CM | POA: Diagnosis not present

## 2017-05-03 MED ORDER — GABAPENTIN 100 MG PO CAPS: 100.0000 mg | ORAL_CAPSULE | Freq: Two times a day (BID) | ORAL | Status: DC

## 2017-05-03 NOTE — Progress Notes (Signed)
Physical Therapy Treatment Patient Details Name: Samuel Atkinson MRN: [id number] DOB: 02/04/1966 Today's Date: 05/03/2017    History of Present Illness Samuel Atkinson is a 51 y/o male admitted on 05/01/17 for myalgia and L LE numbness follonwing alcohol and cocaine use. Patient with a PMHx significant for alcohol withdrawal syndrome without complication, polysubstance abuse, hypertension, cocaine abuse, alcohol dependence, seizures, cerebral AVM status post embolization     PT Comments    Continuing work on functional mobility and activity tolerance; Pt reports not feeling much different between this session and previous PT session. Pt had decreased stance time and decreased dorsiflexion on L and decreased step length on R. Pt reported R hip pain got worse with walking. Pt dependent on UE support on RW during walking. Pt would benefit from Outpatient PT follow-up to help with gait re-training, calf pain/tightness, decreased ROM, stretching and gentle strengthening.     Follow Up Recommendations  Outpatient PT     Equipment Recommendations  Rolling walker with 5" wheels    Recommendations for Other Services       Precautions / Restrictions Precautions Precautions: Fall Restrictions Weight Bearing Restrictions: No    Mobility  Bed Mobility Overal bed mobility: Modified Independent                Transfers Overall transfer level: Supervision Equipment used: Rolling walker (2 wheeled) Transfers: Sit to/from Stand Sit to Stand: Supervision    Minimal to no weight bearing on L LE.         Ambulation/Gait Ambulation/Gait assistance: Regulatory affairs officerMin Guard  Ambulation Distance (Feet): 120 Feet Assistive device: Rolling walker (2 wheeled) Gait Pattern/deviations: Decreased stance time - left;Decreased dorsiflexion - left;Step-through pattern;Decreased step length - right Gait velocity: below normal Gait velocity interpretation: Below normal speed for age/gender   VC to increase L heel  strike. Heavy use of UE on RW to offset L LE pain. VC to self monitor for activity tolerance.   Stairs            Wheelchair Mobility    Modified Rankin (Stroke Patients Only)       Balance Overall balance assessment: Modified Independent                                          Cognition Arousal/Alertness: Awake and Alert  Behavior During Therapy: WFL for tasks assessed/performed;Agitated Overall Cognitive Status: Within Functional Limits for tasks assessed                     Current Attention Level: divided           General Comments: woke up patient for session, Pt reported chest pain and LE pain, spoke to nurse about his pain medication       Exercises      General Comments        Pertinent Vitals/Pain Pain Assessment: 0-10 Pain Score: 7  Pain Location: B LE and B UE(R hip and L calf. chest pain. ) Pain Descriptors / Indicators: Grimacing;Numbness;Constant    Home Living Family/patient expects to be discharged to:: Private residence Living Arrangements: Spouse/significant other Available Help at Discharge: Family Type of Home: House Home Access: Stairs to enter Entrance Stairs-Rails: None Home Layout: One level Home Equipment: Environmental consultantWalker - 2 wheels      Prior Function Level of Independence: Independent  PT Goals (current goals can now be found in the care plan section) Acute Rehab PT Goals PT Goal Formulation: With patient Time For Goal Achievement: 05/16/17 Potential to Achieve Goals: Good    Frequency    Min 3X/week      PT Plan Discharge plan needs to be updated    Co-evaluation              AM-PAC PT "6 Clicks" Daily Activity  Outcome Measure  Difficulty turning over in bed (including adjusting bedclothes, sheets and blankets)?: None Difficulty moving from lying on back to sitting on the side of the bed? : None Difficulty sitting down on and standing up from a chair with arms (e.g.,  wheelchair, bedside commode, etc,.)?: None Help needed moving to and from a bed to chair (including a wheelchair)?: None Help needed walking in hospital room?: None Help needed climbing 3-5 steps with a railing? : A Little 6 Click Score: 23    End of Session Equipment Utilized During Treatment: Gait belt Activity Tolerance: Patient limited by pain Patient left: with family/visitor present;in chair   PT Visit Diagnosis: Other abnormalities of gait and mobility (R26.89);Pain;Muscle weakness (generalized) (M62.81);Unsteadiness on feet (R26.81) Pain - Right/Left: Left(L calf, R hip, chest ) Pain - part of body: Hip;Leg(Pt also reported chest pain)     Time: 4098-1191 PT Time Calculation (min) (ACUTE ONLY): 19 min  Charges:  $Gait Training: 8-22 mins                    G Codes:       Antony Salmon, SPT Acute Rehab Services  Office #: [phone]   Antony Salmon 05/03/2017, 4:39 PM

## 2017-05-03 NOTE — Care Management Note (Signed)
Case Management Note  Patient Details  Name: Samuel Atkinson MRN: [id number] Date of Birth: 07/25/1965  Subjective/Objective: pt to be discharged with RW. Reggie with [facility] will deliver to room                   Action/Plan:CM will sign off for now but will be available should additional discharge needs arise or disposition change.    Expected Discharge Date:  05/03/17               Expected Discharge Plan:     In-House Referral:     Discharge planning Services  CM Consult  Post Acute Care Choice:  Durable Medical Equipment Choice offered to:     DME Arranged:  Walker rolling DME Agency:  [facility]  [facility]     Status of Service:  Completed, signed off  If discussed at Long Length of Stay Meetings, dates discussed:    Additional Comments:  Samuel Atkinson, Samuel Skorupski M, RN 05/03/2017, 3:49 PM

## 2017-08-18 ENCOUNTER — Telehealth: Payer: Self-pay | Admitting: Neurology

## 2017-08-18 ENCOUNTER — Encounter: Payer: Self-pay | Admitting: Neurology

## 2017-08-18 ENCOUNTER — Ambulatory Visit: Payer: Federal, State, Local not specified - PPO | Admitting: Neurology

## 2017-08-18 NOTE — Telephone Encounter (Signed)
This patient did not show for a revisit appointment today. 

## 2017-09-21 ENCOUNTER — Other Ambulatory Visit: Payer: Self-pay | Admitting: Family Medicine

## 2017-09-21 DIAGNOSIS — R32 Unspecified urinary incontinence: Secondary | ICD-10-CM

## 2017-09-21 DIAGNOSIS — R109 Unspecified abdominal pain: Secondary | ICD-10-CM

## 2017-09-25 ENCOUNTER — Ambulatory Visit
Admission: RE | Admit: 2017-09-25 | Discharge: 2017-09-25 | Disposition: A | Discharge status: Home / Self Care | Payer: Managed Care, Other (non HMO) | Source: Ambulatory Visit | Attending: Family Medicine | Admitting: Family Medicine

## 2017-09-25 DIAGNOSIS — R109 Unspecified abdominal pain: Secondary | ICD-10-CM

## 2017-09-25 DIAGNOSIS — R32 Unspecified urinary incontinence: Secondary | ICD-10-CM

## 2017-11-03 IMAGING — CR DG CHEST 2V
2 series · 2 of 2 positions shown · non-contrast
Comparison: None.

CLINICAL DATA: Left-sided chest pain for 3 weeks, no known injury,
initial encounter

EXAM:
CHEST  2 VIEW

[w chest pa]
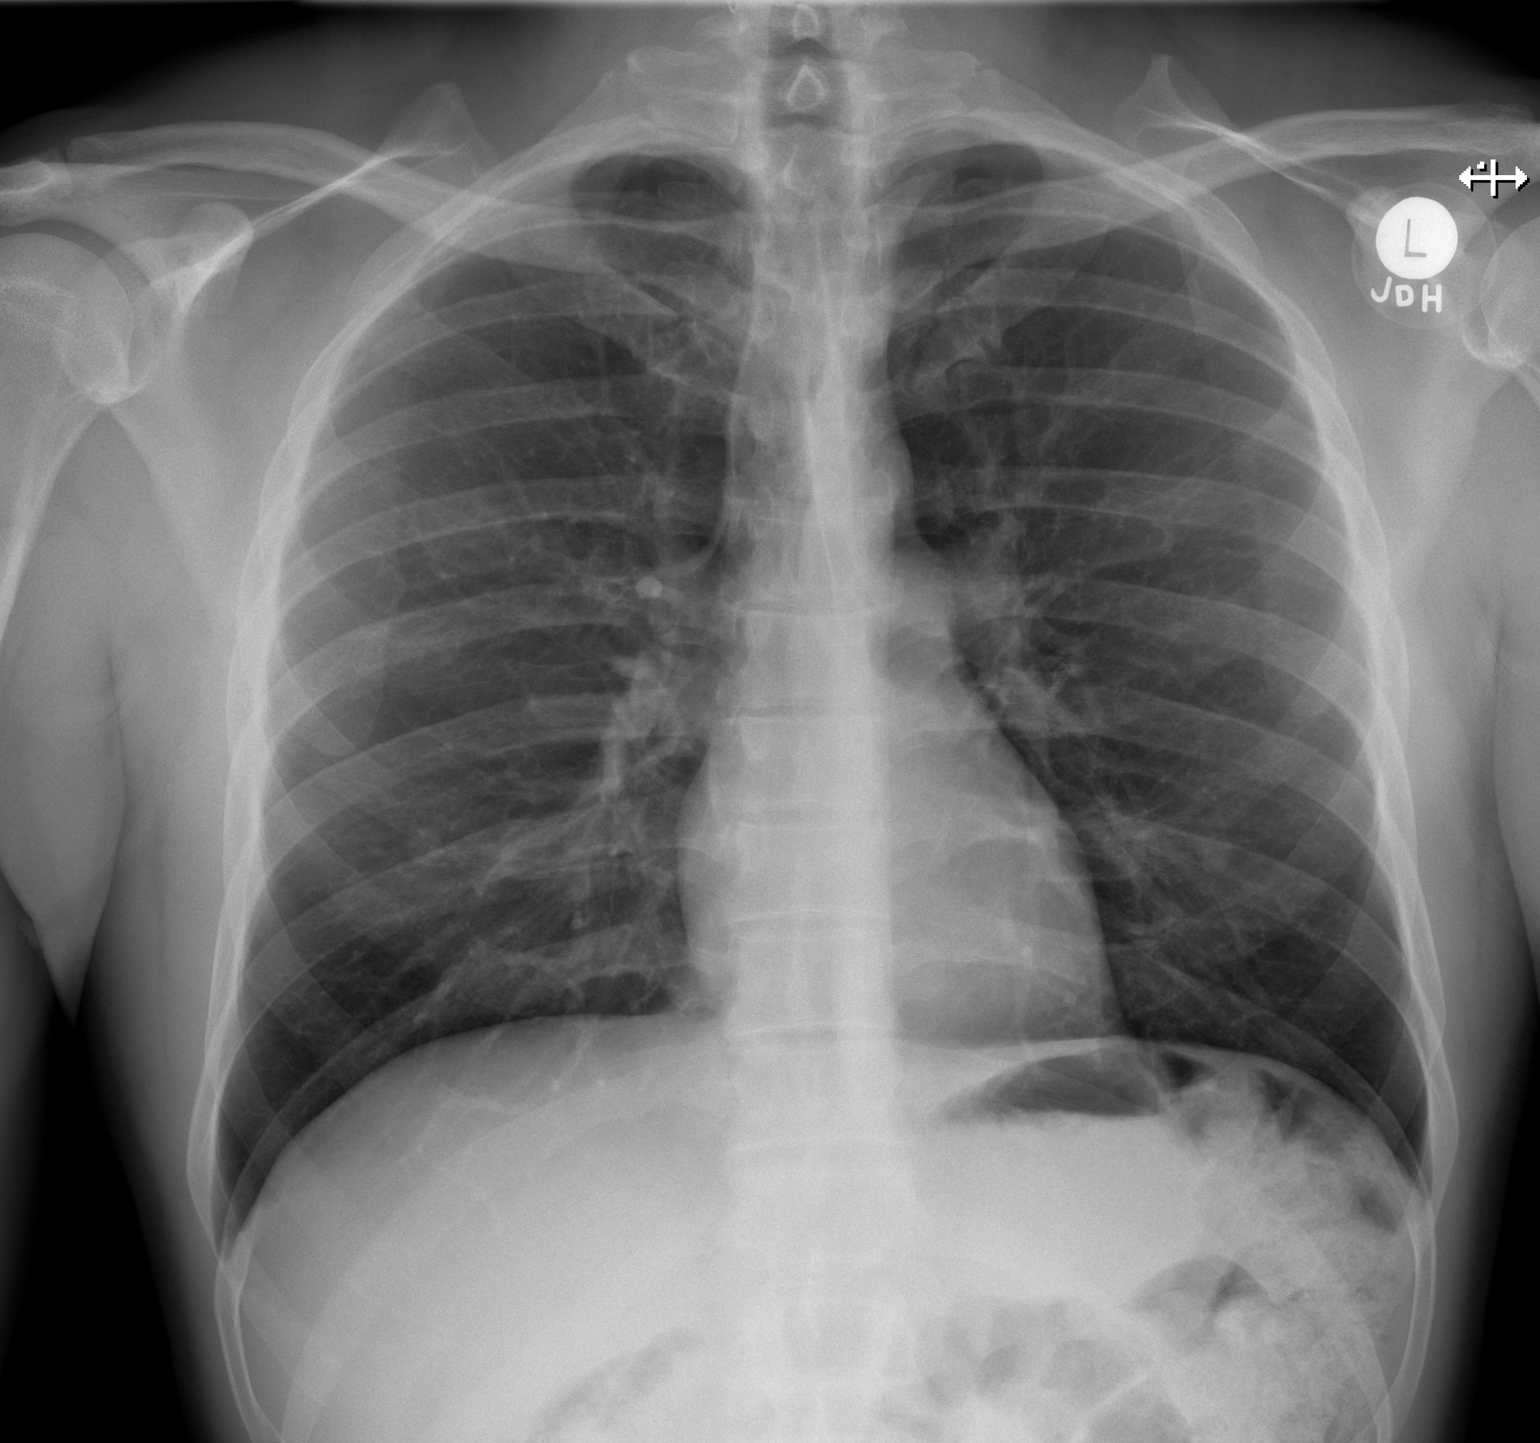

[w chest lat]
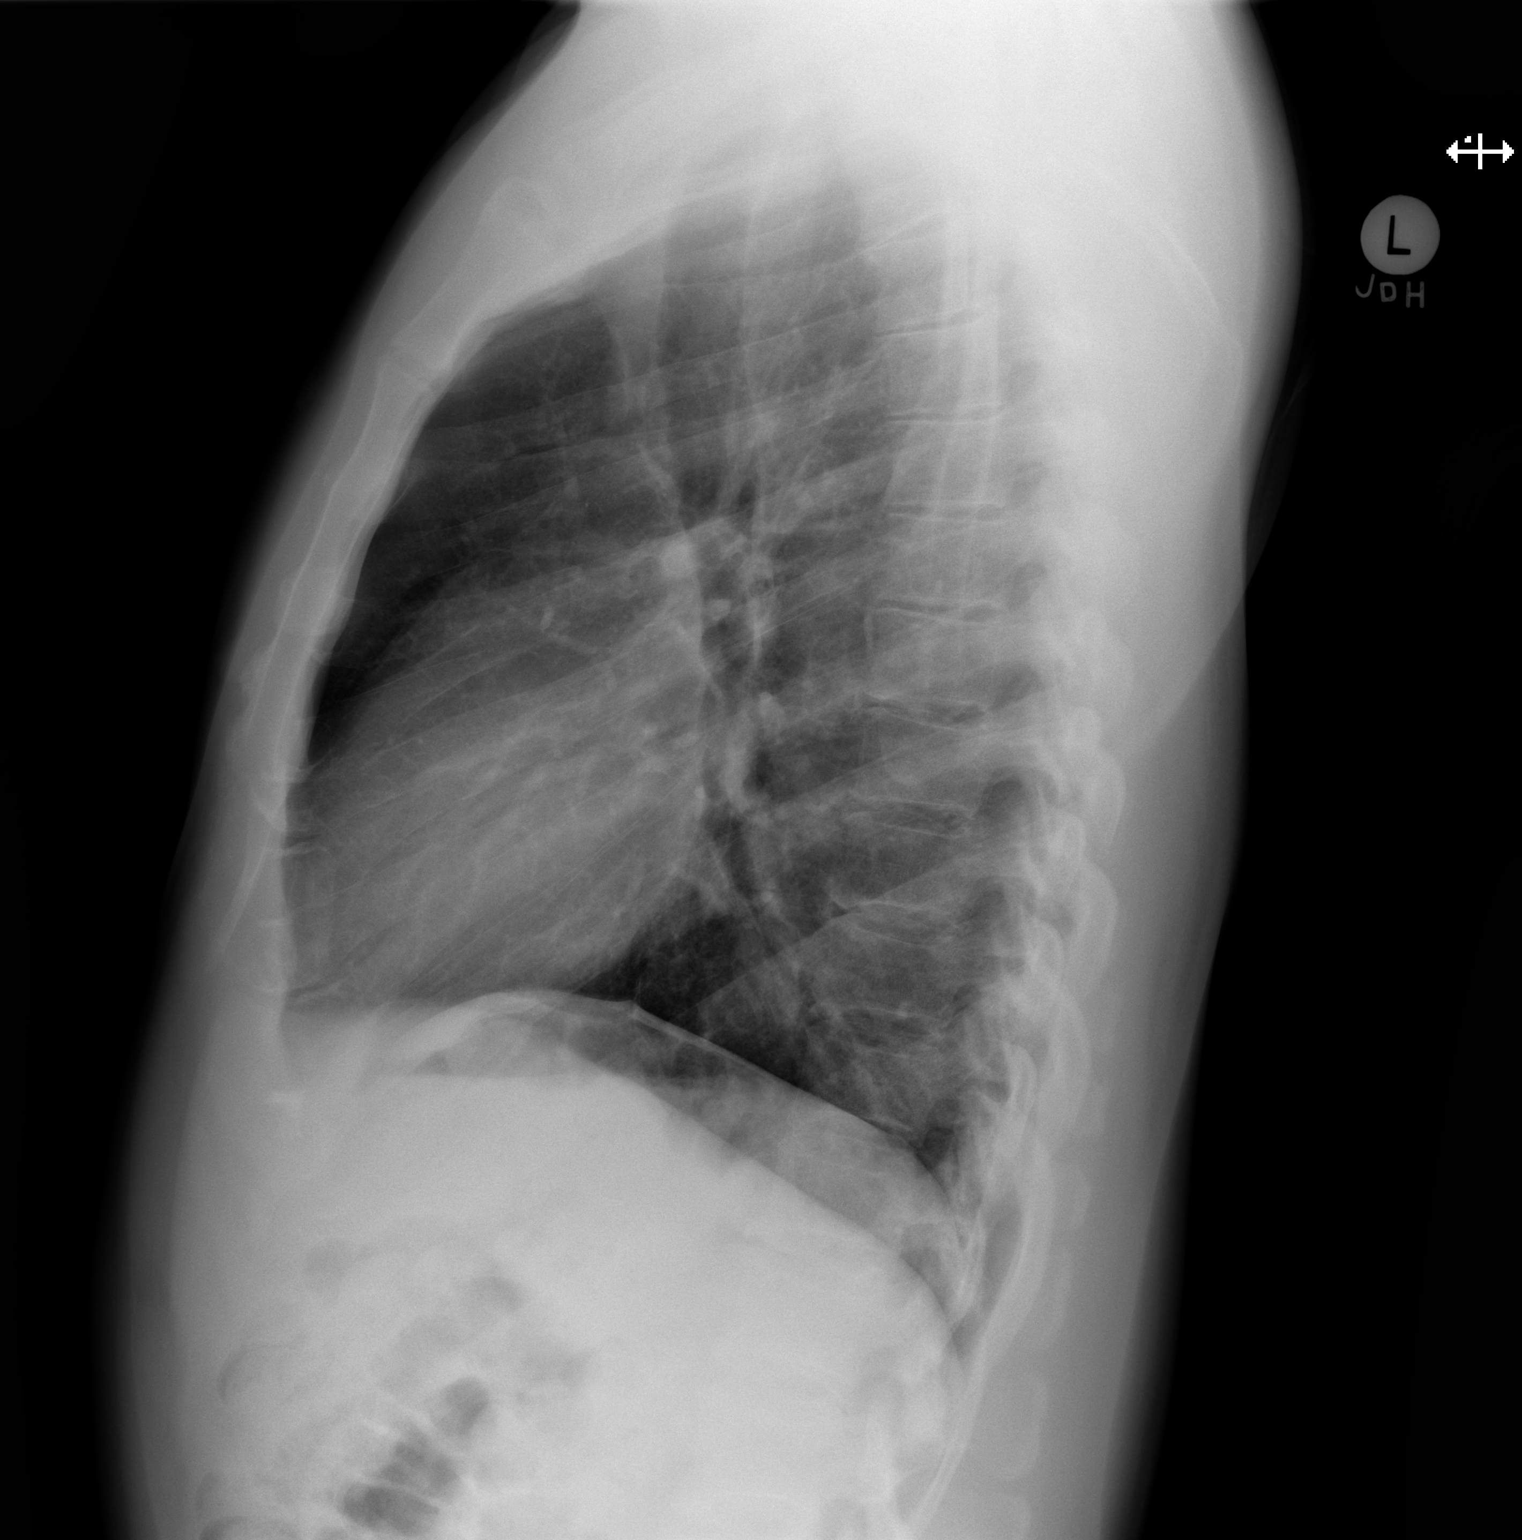

[2 of 2 positions shown; findings below may reference images not displayed]

FINDINGS: The heart size and mediastinal contours are within normal limits.
Both lungs are clear. The visualized skeletal structures are
unremarkable.
IMPRESSION: No active cardiopulmonary disease.

## 2017-11-10 DIAGNOSIS — M5136 Other intervertebral disc degeneration, lumbar region: Secondary | ICD-10-CM | POA: Diagnosis not present

## 2017-11-26 DIAGNOSIS — M48062 Spinal stenosis, lumbar region with neurogenic claudication: Secondary | ICD-10-CM | POA: Diagnosis not present

## 2017-11-26 DIAGNOSIS — M5106 Intervertebral disc disorders with myelopathy, lumbar region: Secondary | ICD-10-CM | POA: Diagnosis not present

## 2017-11-26 DIAGNOSIS — Z6825 Body mass index (BMI) 25.0-25.9, adult: Secondary | ICD-10-CM | POA: Diagnosis not present

## 2017-11-26 DIAGNOSIS — M545 Low back pain: Secondary | ICD-10-CM | POA: Diagnosis not present

## 2017-12-04 DIAGNOSIS — M5127 Other intervertebral disc displacement, lumbosacral region: Secondary | ICD-10-CM | POA: Diagnosis not present

## 2017-12-04 DIAGNOSIS — Z8774 Personal history of (corrected) congenital malformations of heart and circulatory system: Secondary | ICD-10-CM | POA: Diagnosis not present

## 2017-12-04 DIAGNOSIS — Z01818 Encounter for other preprocedural examination: Secondary | ICD-10-CM | POA: Diagnosis not present

## 2017-12-04 DIAGNOSIS — I10 Essential (primary) hypertension: Secondary | ICD-10-CM | POA: Diagnosis not present

## 2018-02-05 DIAGNOSIS — K080 Exfoliation of teeth due to systemic causes: Secondary | ICD-10-CM | POA: Diagnosis not present

## 2018-03-15 DIAGNOSIS — K2970 Gastritis, unspecified, without bleeding: Secondary | ICD-10-CM | POA: Diagnosis not present

## 2018-03-30 DIAGNOSIS — M545 Low back pain: Secondary | ICD-10-CM | POA: Diagnosis not present

## 2018-03-30 DIAGNOSIS — R112 Nausea with vomiting, unspecified: Secondary | ICD-10-CM | POA: Diagnosis not present

## 2018-03-30 DIAGNOSIS — S39012A Strain of muscle, fascia and tendon of lower back, initial encounter: Secondary | ICD-10-CM | POA: Diagnosis not present

## 2018-04-22 DIAGNOSIS — M545 Low back pain: Secondary | ICD-10-CM | POA: Diagnosis not present

## 2018-04-22 DIAGNOSIS — G8929 Other chronic pain: Secondary | ICD-10-CM | POA: Diagnosis not present

## 2018-05-04 DIAGNOSIS — M5116 Intervertebral disc disorders with radiculopathy, lumbar region: Secondary | ICD-10-CM | POA: Diagnosis not present

## 2018-05-04 DIAGNOSIS — M5442 Lumbago with sciatica, left side: Secondary | ICD-10-CM | POA: Diagnosis not present

## 2018-05-04 DIAGNOSIS — G8929 Other chronic pain: Secondary | ICD-10-CM | POA: Diagnosis not present

## 2018-05-21 DIAGNOSIS — M5442 Lumbago with sciatica, left side: Secondary | ICD-10-CM | POA: Diagnosis not present

## 2018-05-21 DIAGNOSIS — G8929 Other chronic pain: Secondary | ICD-10-CM | POA: Diagnosis not present

## 2018-05-21 DIAGNOSIS — M5116 Intervertebral disc disorders with radiculopathy, lumbar region: Secondary | ICD-10-CM | POA: Diagnosis not present

## 2018-06-01 DIAGNOSIS — M5116 Intervertebral disc disorders with radiculopathy, lumbar region: Secondary | ICD-10-CM | POA: Diagnosis not present

## 2018-06-01 DIAGNOSIS — M5442 Lumbago with sciatica, left side: Secondary | ICD-10-CM | POA: Diagnosis not present

## 2018-06-01 DIAGNOSIS — M5127 Other intervertebral disc displacement, lumbosacral region: Secondary | ICD-10-CM | POA: Diagnosis not present

## 2018-06-01 DIAGNOSIS — G8929 Other chronic pain: Secondary | ICD-10-CM | POA: Diagnosis not present

## 2018-06-24 DIAGNOSIS — G8929 Other chronic pain: Secondary | ICD-10-CM | POA: Diagnosis not present

## 2018-06-24 DIAGNOSIS — M6281 Muscle weakness (generalized): Secondary | ICD-10-CM | POA: Diagnosis not present

## 2018-06-24 DIAGNOSIS — M5386 Other specified dorsopathies, lumbar region: Secondary | ICD-10-CM | POA: Diagnosis not present

## 2018-06-24 DIAGNOSIS — M5442 Lumbago with sciatica, left side: Secondary | ICD-10-CM | POA: Diagnosis not present

## 2018-06-24 DIAGNOSIS — M6289 Other specified disorders of muscle: Secondary | ICD-10-CM | POA: Diagnosis not present

## 2018-06-24 DIAGNOSIS — R2689 Other abnormalities of gait and mobility: Secondary | ICD-10-CM | POA: Diagnosis not present

## 2018-06-24 DIAGNOSIS — M5116 Intervertebral disc disorders with radiculopathy, lumbar region: Secondary | ICD-10-CM | POA: Diagnosis not present

## 2018-07-01 DIAGNOSIS — J019 Acute sinusitis, unspecified: Secondary | ICD-10-CM | POA: Diagnosis not present

## 2018-07-05 DIAGNOSIS — M5442 Lumbago with sciatica, left side: Secondary | ICD-10-CM | POA: Diagnosis not present

## 2018-07-05 DIAGNOSIS — M5136 Other intervertebral disc degeneration, lumbar region: Secondary | ICD-10-CM | POA: Diagnosis not present

## 2018-07-05 DIAGNOSIS — G8929 Other chronic pain: Secondary | ICD-10-CM | POA: Diagnosis not present

## 2018-07-09 DIAGNOSIS — R6889 Other general symptoms and signs: Secondary | ICD-10-CM | POA: Diagnosis not present

## 2018-07-09 DIAGNOSIS — R05 Cough: Secondary | ICD-10-CM | POA: Diagnosis not present

## 2018-07-09 DIAGNOSIS — J3489 Other specified disorders of nose and nasal sinuses: Secondary | ICD-10-CM | POA: Diagnosis not present

## 2018-07-20 DIAGNOSIS — M4807 Spinal stenosis, lumbosacral region: Secondary | ICD-10-CM | POA: Diagnosis not present

## 2018-07-20 DIAGNOSIS — F17210 Nicotine dependence, cigarettes, uncomplicated: Secondary | ICD-10-CM | POA: Diagnosis not present

## 2018-07-20 DIAGNOSIS — M5432 Sciatica, left side: Secondary | ICD-10-CM | POA: Diagnosis not present

## 2018-08-09 DIAGNOSIS — M5136 Other intervertebral disc degeneration, lumbar region: Secondary | ICD-10-CM | POA: Diagnosis not present

## 2018-08-09 DIAGNOSIS — M5442 Lumbago with sciatica, left side: Secondary | ICD-10-CM | POA: Diagnosis not present

## 2018-08-09 DIAGNOSIS — G8929 Other chronic pain: Secondary | ICD-10-CM | POA: Diagnosis not present

## 2018-08-29 DIAGNOSIS — R399 Unspecified symptoms and signs involving the genitourinary system: Secondary | ICD-10-CM | POA: Diagnosis not present

## 2018-08-29 DIAGNOSIS — R350 Frequency of micturition: Secondary | ICD-10-CM | POA: Diagnosis not present

## 2018-08-29 DIAGNOSIS — R339 Retention of urine, unspecified: Secondary | ICD-10-CM | POA: Diagnosis not present

## 2018-09-29 DIAGNOSIS — Z7721 Contact with and (suspected) exposure to potentially hazardous body fluids: Secondary | ICD-10-CM | POA: Diagnosis not present

## 2018-09-29 DIAGNOSIS — F1910 Other psychoactive substance abuse, uncomplicated: Secondary | ICD-10-CM | POA: Diagnosis not present

## 2018-09-29 DIAGNOSIS — G40909 Epilepsy, unspecified, not intractable, without status epilepticus: Secondary | ICD-10-CM | POA: Diagnosis not present

## 2019-01-30 ENCOUNTER — Emergency Department (HOSPITAL_COMMUNITY)
Admission: EM | Admit: 2019-01-30 | Discharge: 2019-03-01 | Disposition: E | Payer: Federal, State, Local not specified - PPO | Attending: Emergency Medicine | Admitting: Emergency Medicine

## 2019-01-30 ENCOUNTER — Other Ambulatory Visit: Payer: Self-pay

## 2019-01-30 DIAGNOSIS — Y999 Unspecified external cause status: Secondary | ICD-10-CM | POA: Diagnosis not present

## 2019-01-30 DIAGNOSIS — T07XXXA Unspecified multiple injuries, initial encounter: Secondary | ICD-10-CM | POA: Diagnosis not present

## 2019-01-30 DIAGNOSIS — Y929 Unspecified place or not applicable: Secondary | ICD-10-CM | POA: Insufficient documentation

## 2019-01-30 DIAGNOSIS — I469 Cardiac arrest, cause unspecified: Secondary | ICD-10-CM | POA: Diagnosis not present

## 2019-01-30 DIAGNOSIS — X58XXXA Exposure to other specified factors, initial encounter: Secondary | ICD-10-CM | POA: Diagnosis not present

## 2019-01-30 DIAGNOSIS — Y939 Activity, unspecified: Secondary | ICD-10-CM | POA: Diagnosis not present
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
## 2019-01-30 NOTE — ED Notes (Signed)
CSI at bedside.

## 2019-01-31 ENCOUNTER — Telehealth: Payer: Self-pay | Admitting: *Deleted

## 2019-03-01 DEATH — deceased

## 2019-03-16 IMAGING — US US ABDOMEN COMPLETE
1 series · 14 of 25 positions shown · non-contrast
Comparison: None.

CLINICAL DATA: Abdominal pain and nausea 3 weeks with urinary
incontinence 1 year.

EXAM:
ABDOMEN ULTRASOUND COMPLETE

[Series 1: us abdomen complete · 0.22mm/px · 14 of 89 slices shown]
[im 1/89]
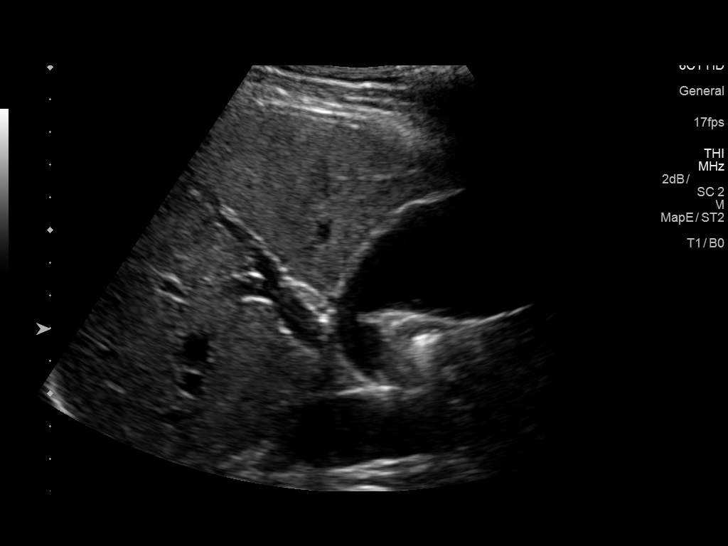
[im 8/89]
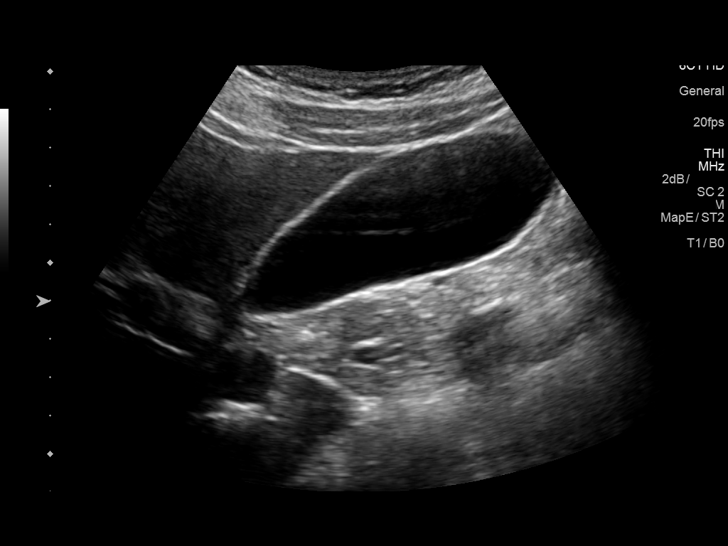
[im 15/89]
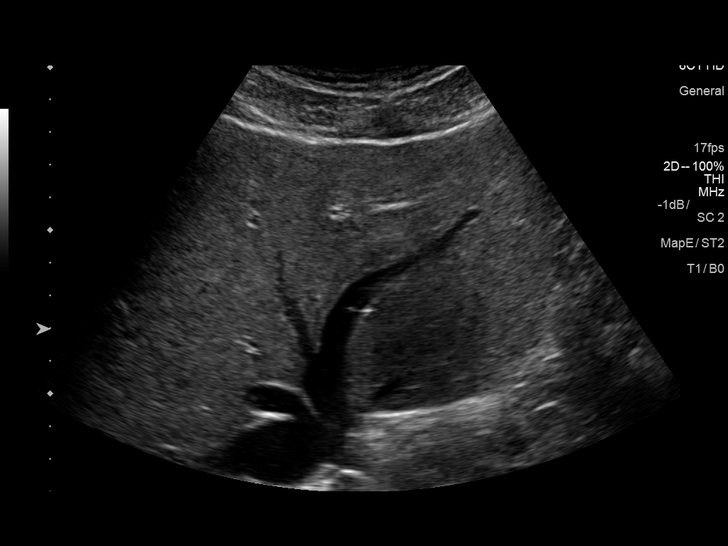
[im 23/89]
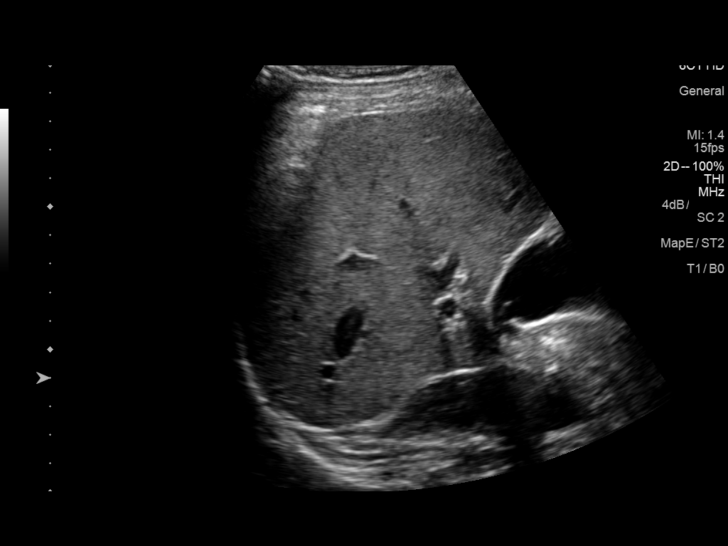
[im 30/89]
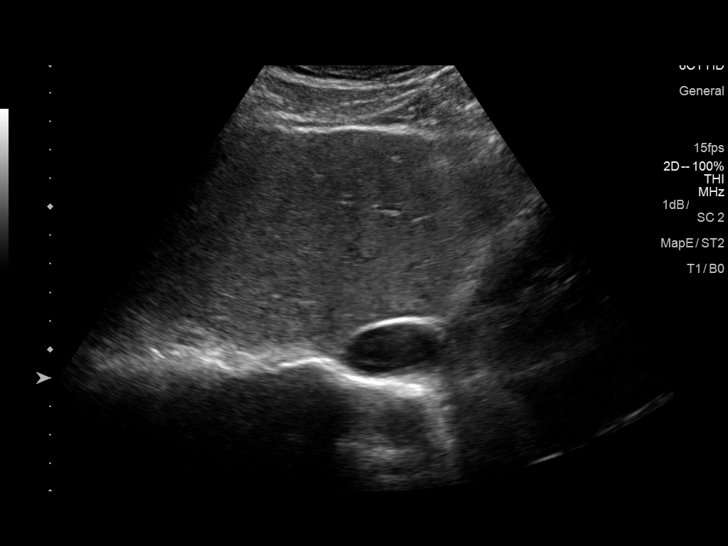
[im 34/89]
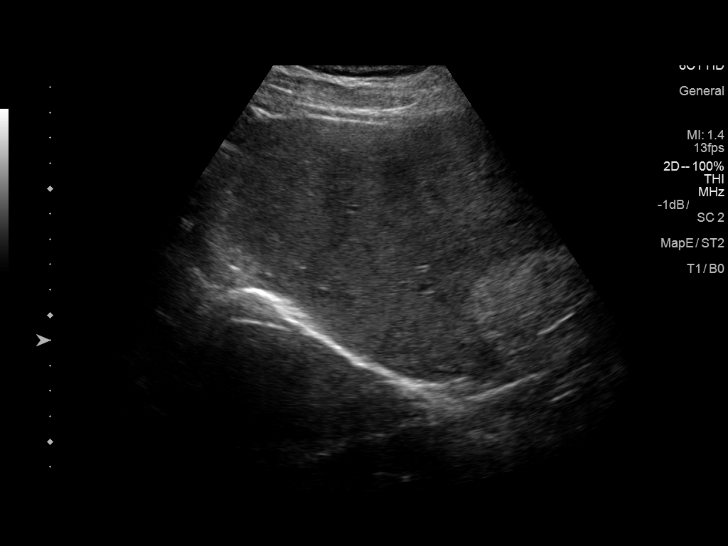
[im 41/89]
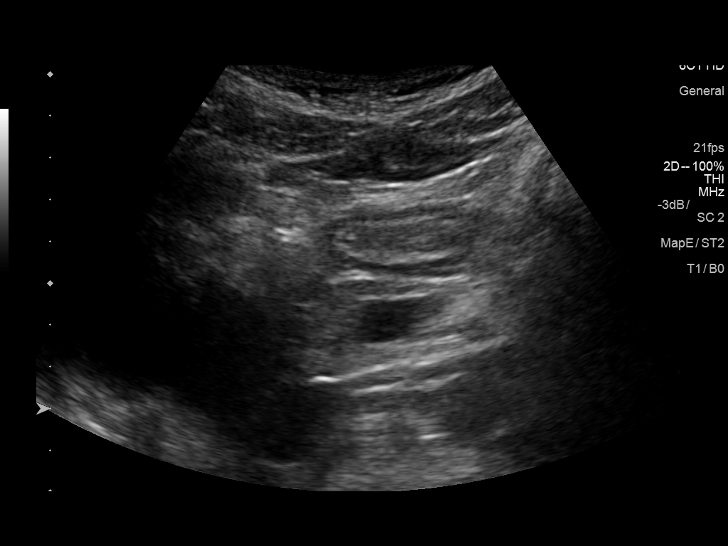
[im 48/89]
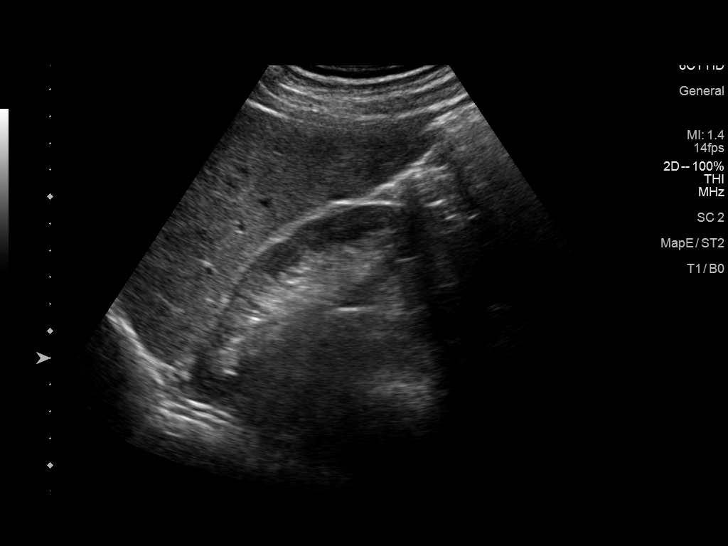
[im 56/89]
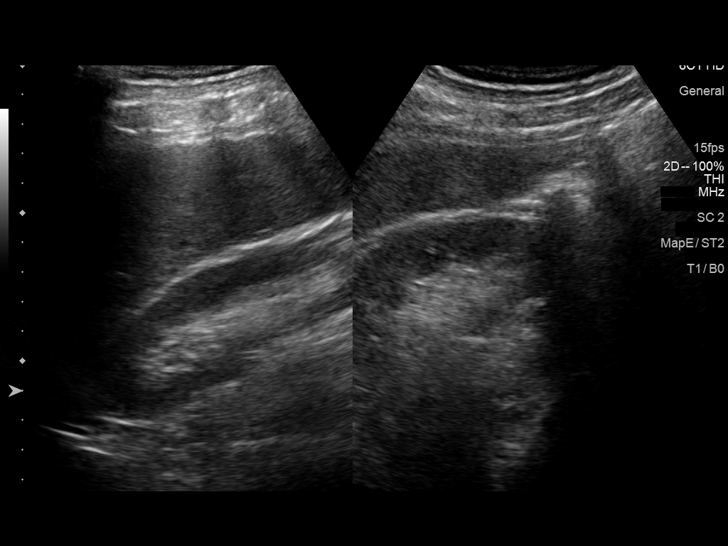
[im 59/89]
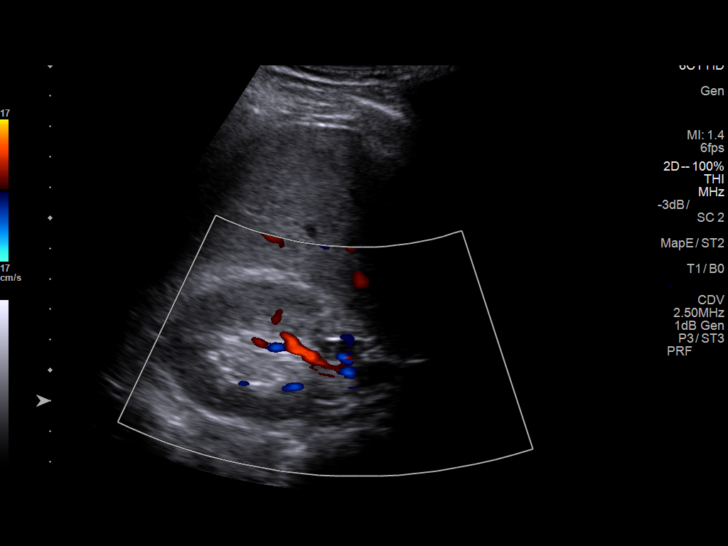
[im 67/89]
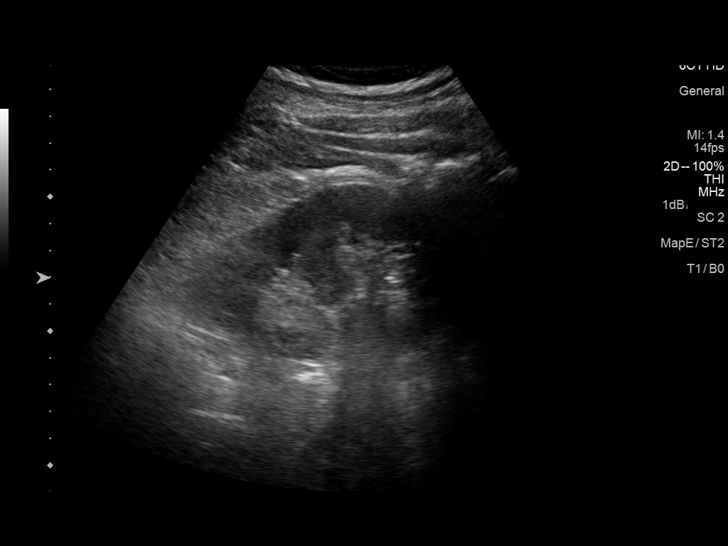
[im 74/89]
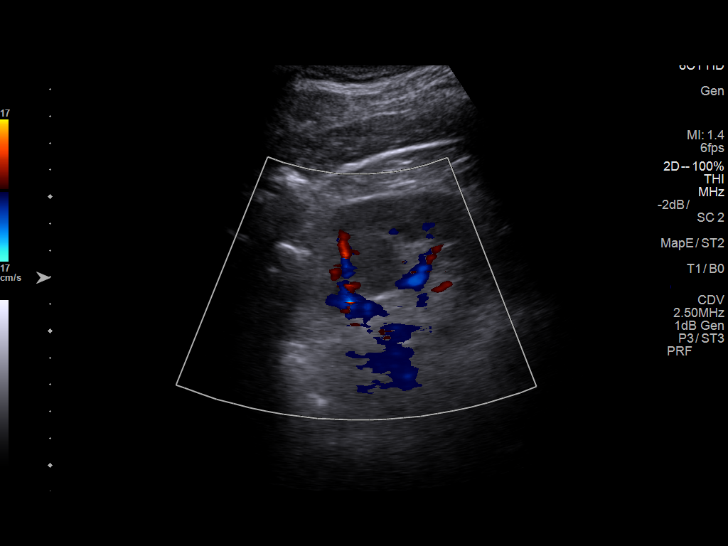
[im 81/89]
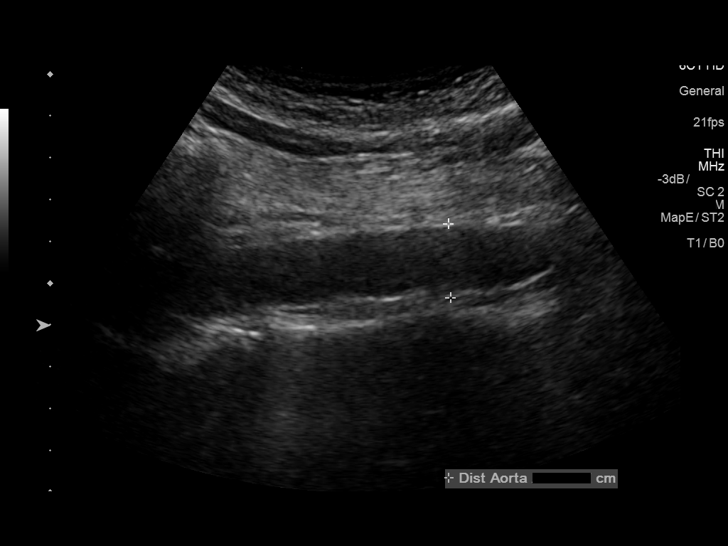
[im 89/89]
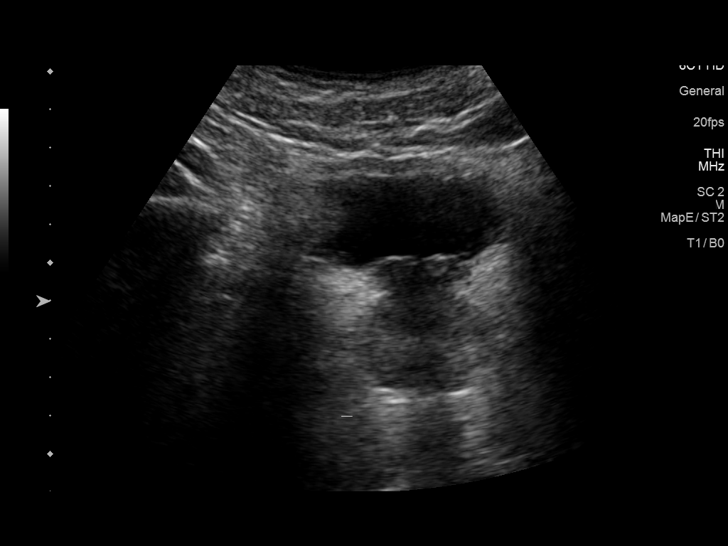

[14 of 25 positions shown; findings below may reference images not displayed]

FINDINGS: Gallbladder: No gallstones or wall thickening visualized. No
sonographic Murphy sign noted by sonographer.

Common bile duct: Diameter: 3.4 mm.

Liver: No focal lesion identified. Within normal limits in
parenchymal echogenicity. Portal vein is patent on color Doppler
imaging with normal direction of blood flow towards the liver.

IVC: No abnormality visualized.

Pancreas: Visualized portion unremarkable.

Spleen: Size and appearance within normal limits.

Right Kidney: Length: 10.6 cm. Echogenicity within normal limits. No
mass or hydronephrosis visualized.

Left Kidney: Length: 11.3 cm. Echogenicity within normal limits. No
mass or hydronephrosis visualized.

Abdominal aorta: No aneurysm visualized.

Other findings: Bladder partially distended and otherwise
unremarkable.
IMPRESSION: No acute findings.
